# Patient Record
Sex: Male | Born: 1940 | Race: White | Hispanic: No | Marital: Married | Smoking: Former smoker
Health system: Southern US, Community
[De-identification: ages and names within clinical notes are randomized; demographics above are authoritative.]

## PROBLEM LIST (undated history)

## (undated) DIAGNOSIS — G8929 Other chronic pain: Secondary | ICD-10-CM

## (undated) DIAGNOSIS — K449 Diaphragmatic hernia without obstruction or gangrene: Secondary | ICD-10-CM

## (undated) DIAGNOSIS — R339 Retention of urine, unspecified: Secondary | ICD-10-CM

## (undated) DIAGNOSIS — G629 Polyneuropathy, unspecified: Secondary | ICD-10-CM

## (undated) DIAGNOSIS — K219 Gastro-esophageal reflux disease without esophagitis: Secondary | ICD-10-CM

## (undated) DIAGNOSIS — Z951 Presence of aortocoronary bypass graft: Secondary | ICD-10-CM

## (undated) DIAGNOSIS — R011 Cardiac murmur, unspecified: Secondary | ICD-10-CM

## (undated) DIAGNOSIS — IMO0001 Reserved for inherently not codable concepts without codable children: Secondary | ICD-10-CM

## (undated) DIAGNOSIS — I209 Angina pectoris, unspecified: Secondary | ICD-10-CM

## (undated) DIAGNOSIS — Z9889 Other specified postprocedural states: Secondary | ICD-10-CM

## (undated) DIAGNOSIS — I219 Acute myocardial infarction, unspecified: Secondary | ICD-10-CM

## (undated) DIAGNOSIS — I10 Essential (primary) hypertension: Secondary | ICD-10-CM

## (undated) DIAGNOSIS — H9190 Unspecified hearing loss, unspecified ear: Secondary | ICD-10-CM

## (undated) DIAGNOSIS — C61 Malignant neoplasm of prostate: Secondary | ICD-10-CM

## (undated) DIAGNOSIS — M109 Gout, unspecified: Secondary | ICD-10-CM

## (undated) DIAGNOSIS — D689 Coagulation defect, unspecified: Secondary | ICD-10-CM

## (undated) DIAGNOSIS — I2510 Atherosclerotic heart disease of native coronary artery without angina pectoris: Secondary | ICD-10-CM

## (undated) DIAGNOSIS — G4700 Insomnia, unspecified: Secondary | ICD-10-CM

## (undated) DIAGNOSIS — N200 Calculus of kidney: Secondary | ICD-10-CM

## (undated) DIAGNOSIS — M1990 Unspecified osteoarthritis, unspecified site: Secondary | ICD-10-CM

## (undated) DIAGNOSIS — E7800 Pure hypercholesterolemia, unspecified: Secondary | ICD-10-CM

## (undated) DIAGNOSIS — Z955 Presence of coronary angioplasty implant and graft: Secondary | ICD-10-CM

## (undated) DIAGNOSIS — Z789 Other specified health status: Secondary | ICD-10-CM

## (undated) DIAGNOSIS — N35919 Unspecified urethral stricture, male, unspecified site: Secondary | ICD-10-CM

## (undated) DIAGNOSIS — I82409 Acute embolism and thrombosis of unspecified deep veins of unspecified lower extremity: Secondary | ICD-10-CM

## (undated) DIAGNOSIS — N320 Bladder-neck obstruction: Secondary | ICD-10-CM

## (undated) DIAGNOSIS — K229 Disease of esophagus, unspecified: Secondary | ICD-10-CM

## (undated) DIAGNOSIS — M549 Dorsalgia, unspecified: Secondary | ICD-10-CM

## (undated) DIAGNOSIS — IMO0002 Reserved for concepts with insufficient information to code with codable children: Secondary | ICD-10-CM

## (undated) HISTORY — DX: Diaphragmatic hernia without obstruction or gangrene: K44.9

## (undated) HISTORY — PX: COLONOSCOPY: SHX174

## (undated) HISTORY — PX: CATARACT EXTRACTION W/ INTRAOCULAR LENS  IMPLANT, BILATERAL: SHX1307

## (undated) HISTORY — PX: CORONARY ANGIOPLASTY WITH STENT PLACEMENT: SHX49

## (undated) HISTORY — DX: Coagulation defect, unspecified: D68.9

## (undated) HISTORY — DX: Atherosclerotic heart disease of native coronary artery without angina pectoris: I25.10

## (undated) HISTORY — PX: OTHER SURGICAL HISTORY: SHX169

## (undated) HISTORY — DX: Calculus of kidney: N20.0

## (undated) HISTORY — PX: EXTRACORPOREAL SHOCK WAVE LITHOTRIPSY: SHX1557

## (undated) HISTORY — PX: POLYPECTOMY: SHX149

## (undated) HISTORY — DX: Essential (primary) hypertension: I10

## (undated) HISTORY — PX: CARDIAC CATHETERIZATION: SHX172

## (undated) HISTORY — DX: Disease of esophagus, unspecified: K22.9

---
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
## 1969-11-24 DIAGNOSIS — M109 Gout, unspecified: Secondary | ICD-10-CM

## 1969-11-24 HISTORY — DX: Gout, unspecified: M10.9

## 1979-07-26 HISTORY — PX: TYMPANOPLASTY: SHX33

## 1987-11-25 DIAGNOSIS — I209 Angina pectoris, unspecified: Secondary | ICD-10-CM

## 1987-11-25 HISTORY — DX: Angina pectoris, unspecified: I20.9

## 1987-11-25 HISTORY — PX: OTHER SURGICAL HISTORY: SHX169

## 1994-11-24 HISTORY — PX: PROSTATECTOMY: SHX69

## 1998-11-24 HISTORY — PX: CORONARY ARTERY BYPASS GRAFT: SHX141

## 1999-04-01 ENCOUNTER — Inpatient Hospital Stay (HOSPITAL_COMMUNITY): Admission: EM | Admit: 1999-04-01 | Discharge: 1999-04-03 | Payer: Self-pay | Admitting: Emergency Medicine

## 1999-04-01 ENCOUNTER — Encounter: Payer: Self-pay | Admitting: Emergency Medicine

## 1999-06-03 ENCOUNTER — Inpatient Hospital Stay (HOSPITAL_COMMUNITY): Admission: AD | Admit: 1999-06-03 | Discharge: 1999-06-11 | Payer: Self-pay | Admitting: Cardiology

## 1999-06-05 ENCOUNTER — Encounter: Payer: Self-pay | Admitting: Surgery

## 1999-06-06 ENCOUNTER — Encounter: Payer: Self-pay | Admitting: Surgery

## 1999-06-07 ENCOUNTER — Encounter: Payer: Self-pay | Admitting: Surgery

## 1999-06-09 ENCOUNTER — Encounter: Payer: Self-pay | Admitting: Surgery

## 2000-11-24 HISTORY — PX: CAROTID ENDARTERECTOMY: SUR193

## 2001-07-21 ENCOUNTER — Encounter: Payer: Self-pay | Admitting: *Deleted

## 2001-07-22 ENCOUNTER — Inpatient Hospital Stay (HOSPITAL_COMMUNITY): Admission: RE | Admit: 2001-07-22 | Discharge: 2001-07-23 | Payer: Self-pay | Admitting: *Deleted

## 2001-07-22 ENCOUNTER — Encounter (INDEPENDENT_AMBULATORY_CARE_PROVIDER_SITE_OTHER): Payer: Self-pay | Admitting: *Deleted

## 2001-11-24 HISTORY — PX: CYSTOSCOPY W/ INTERNAL URETHROTOMY: SUR376

## 2002-01-27 ENCOUNTER — Ambulatory Visit (HOSPITAL_COMMUNITY): Admission: RE | Admit: 2002-01-27 | Discharge: 2002-01-27 | Payer: Self-pay | Admitting: Urology

## 2002-05-23 ENCOUNTER — Ambulatory Visit (HOSPITAL_COMMUNITY): Admission: RE | Admit: 2002-05-23 | Discharge: 2002-05-23 | Payer: Self-pay | Admitting: Urology

## 2002-07-21 ENCOUNTER — Ambulatory Visit (HOSPITAL_COMMUNITY): Admission: RE | Admit: 2002-07-21 | Discharge: 2002-07-21 | Payer: Self-pay | Admitting: Urology

## 2002-08-11 ENCOUNTER — Inpatient Hospital Stay (HOSPITAL_COMMUNITY): Admission: AD | Admit: 2002-08-11 | Discharge: 2002-08-15 | Payer: Self-pay | Admitting: Cardiology

## 2002-12-08 ENCOUNTER — Ambulatory Visit (HOSPITAL_COMMUNITY): Admission: RE | Admit: 2002-12-08 | Discharge: 2002-12-08 | Payer: Self-pay | Admitting: *Deleted

## 2002-12-08 ENCOUNTER — Encounter: Payer: Self-pay | Admitting: *Deleted

## 2003-03-27 ENCOUNTER — Ambulatory Visit (HOSPITAL_COMMUNITY): Admission: RE | Admit: 2003-03-27 | Discharge: 2003-03-27 | Payer: Self-pay | Admitting: Urology

## 2003-12-06 ENCOUNTER — Ambulatory Visit (HOSPITAL_COMMUNITY): Admission: RE | Admit: 2003-12-06 | Discharge: 2003-12-06 | Payer: Self-pay | Admitting: Urology

## 2004-02-19 ENCOUNTER — Observation Stay (HOSPITAL_COMMUNITY): Admission: AD | Admit: 2004-02-19 | Discharge: 2004-02-20 | Payer: Self-pay | Admitting: Cardiology

## 2004-11-24 DIAGNOSIS — I219 Acute myocardial infarction, unspecified: Secondary | ICD-10-CM

## 2004-11-24 HISTORY — DX: Acute myocardial infarction, unspecified: I21.9

## 2005-03-30 ENCOUNTER — Ambulatory Visit: Payer: Self-pay | Admitting: Cardiology

## 2005-03-30 ENCOUNTER — Emergency Department (HOSPITAL_COMMUNITY): Admission: EM | Admit: 2005-03-30 | Discharge: 2005-03-30 | Payer: Self-pay | Admitting: Emergency Medicine

## 2005-04-02 ENCOUNTER — Ambulatory Visit: Payer: Self-pay | Admitting: Cardiology

## 2005-04-02 ENCOUNTER — Ambulatory Visit: Payer: Self-pay | Admitting: *Deleted

## 2005-04-02 ENCOUNTER — Inpatient Hospital Stay (HOSPITAL_COMMUNITY): Admission: EM | Admit: 2005-04-02 | Discharge: 2005-04-05 | Payer: Self-pay | Admitting: Emergency Medicine

## 2005-04-28 ENCOUNTER — Ambulatory Visit: Payer: Self-pay | Admitting: Cardiology

## 2005-04-28 ENCOUNTER — Encounter (HOSPITAL_COMMUNITY): Admission: RE | Admit: 2005-04-28 | Discharge: 2005-07-27 | Payer: Self-pay | Admitting: Cardiology

## 2005-05-15 ENCOUNTER — Ambulatory Visit: Payer: Self-pay | Admitting: Cardiology

## 2005-05-22 ENCOUNTER — Ambulatory Visit: Payer: Self-pay

## 2005-06-26 ENCOUNTER — Ambulatory Visit: Payer: Self-pay | Admitting: Cardiology

## 2005-07-22 ENCOUNTER — Ambulatory Visit: Payer: Self-pay | Admitting: Cardiology

## 2005-07-23 ENCOUNTER — Ambulatory Visit: Payer: Self-pay | Admitting: Cardiology

## 2005-07-23 ENCOUNTER — Ambulatory Visit (HOSPITAL_COMMUNITY): Admission: RE | Admit: 2005-07-23 | Discharge: 2005-07-24 | Payer: Self-pay | Admitting: Cardiology

## 2005-07-28 ENCOUNTER — Encounter (HOSPITAL_COMMUNITY): Admission: RE | Admit: 2005-07-28 | Discharge: 2005-10-26 | Payer: Self-pay | Admitting: Cardiology

## 2005-08-04 ENCOUNTER — Ambulatory Visit: Payer: Self-pay | Admitting: Physician Assistant

## 2005-09-01 ENCOUNTER — Ambulatory Visit: Payer: Self-pay | Admitting: Cardiology

## 2005-09-24 ENCOUNTER — Ambulatory Visit: Payer: Self-pay | Admitting: *Deleted

## 2005-10-01 ENCOUNTER — Ambulatory Visit: Payer: Self-pay | Admitting: Cardiology

## 2005-10-30 ENCOUNTER — Ambulatory Visit: Payer: Self-pay | Admitting: Cardiology

## 2005-12-01 ENCOUNTER — Ambulatory Visit (HOSPITAL_COMMUNITY): Admission: RE | Admit: 2005-12-01 | Discharge: 2005-12-01 | Payer: Self-pay | Admitting: Urology

## 2005-12-25 ENCOUNTER — Ambulatory Visit: Payer: Self-pay | Admitting: Cardiology

## 2006-01-28 ENCOUNTER — Ambulatory Visit: Payer: Self-pay | Admitting: Cardiology

## 2006-03-18 ENCOUNTER — Ambulatory Visit: Payer: Self-pay | Admitting: *Deleted

## 2006-08-24 ENCOUNTER — Ambulatory Visit (HOSPITAL_BASED_OUTPATIENT_CLINIC_OR_DEPARTMENT_OTHER): Admission: RE | Admit: 2006-08-24 | Discharge: 2006-08-24 | Payer: Self-pay | Admitting: Urology

## 2006-09-10 ENCOUNTER — Emergency Department (HOSPITAL_COMMUNITY): Admission: EM | Admit: 2006-09-10 | Discharge: 2006-09-10 | Payer: Self-pay | Admitting: Emergency Medicine

## 2006-11-05 ENCOUNTER — Ambulatory Visit: Payer: Self-pay | Admitting: Cardiology

## 2007-08-10 ENCOUNTER — Ambulatory Visit: Payer: Self-pay | Admitting: Cardiology

## 2007-10-26 ENCOUNTER — Ambulatory Visit: Payer: Self-pay | Admitting: Cardiology

## 2007-10-26 ENCOUNTER — Inpatient Hospital Stay (HOSPITAL_COMMUNITY): Admission: EM | Admit: 2007-10-26 | Discharge: 2007-10-28 | Payer: Self-pay | Admitting: Emergency Medicine

## 2007-11-01 ENCOUNTER — Ambulatory Visit: Payer: Self-pay | Admitting: Cardiology

## 2007-11-01 LAB — CONVERTED CEMR LAB
BUN: 12 mg/dL (ref 6–23)
Creatinine, Ser: 1.2 mg/dL (ref 0.4–1.5)
GFR calc Af Amer: 78 mL/min
GFR calc non Af Amer: 64 mL/min
Potassium: 3.8 meq/L (ref 3.5–5.1)

## 2007-12-22 ENCOUNTER — Ambulatory Visit: Payer: Self-pay | Admitting: Cardiology

## 2007-12-23 ENCOUNTER — Ambulatory Visit (HOSPITAL_BASED_OUTPATIENT_CLINIC_OR_DEPARTMENT_OTHER): Admission: RE | Admit: 2007-12-23 | Discharge: 2007-12-23 | Payer: Self-pay | Admitting: Urology

## 2008-02-25 ENCOUNTER — Ambulatory Visit (HOSPITAL_BASED_OUTPATIENT_CLINIC_OR_DEPARTMENT_OTHER): Admission: RE | Admit: 2008-02-25 | Discharge: 2008-02-25 | Payer: Self-pay | Admitting: Urology

## 2008-03-02 ENCOUNTER — Inpatient Hospital Stay (HOSPITAL_COMMUNITY): Admission: EM | Admit: 2008-03-02 | Discharge: 2008-03-04 | Payer: Self-pay | Admitting: Emergency Medicine

## 2008-03-02 ENCOUNTER — Ambulatory Visit: Payer: Self-pay | Admitting: Cardiology

## 2008-03-15 ENCOUNTER — Ambulatory Visit: Payer: Self-pay | Admitting: Cardiology

## 2008-12-21 ENCOUNTER — Ambulatory Visit (HOSPITAL_BASED_OUTPATIENT_CLINIC_OR_DEPARTMENT_OTHER): Admission: RE | Admit: 2008-12-21 | Discharge: 2008-12-21 | Payer: Self-pay | Admitting: Urology

## 2008-12-25 ENCOUNTER — Ambulatory Visit: Payer: Self-pay | Admitting: Cardiology

## 2009-02-14 ENCOUNTER — Encounter (INDEPENDENT_AMBULATORY_CARE_PROVIDER_SITE_OTHER): Payer: Self-pay | Admitting: *Deleted

## 2009-08-18 DIAGNOSIS — I2510 Atherosclerotic heart disease of native coronary artery without angina pectoris: Secondary | ICD-10-CM | POA: Insufficient documentation

## 2009-08-18 DIAGNOSIS — N200 Calculus of kidney: Secondary | ICD-10-CM

## 2009-08-18 DIAGNOSIS — I10 Essential (primary) hypertension: Secondary | ICD-10-CM | POA: Insufficient documentation

## 2009-08-18 DIAGNOSIS — Z8546 Personal history of malignant neoplasm of prostate: Secondary | ICD-10-CM | POA: Insufficient documentation

## 2009-08-23 ENCOUNTER — Ambulatory Visit: Payer: Self-pay | Admitting: Cardiology

## 2009-08-23 DIAGNOSIS — E7800 Pure hypercholesterolemia, unspecified: Secondary | ICD-10-CM | POA: Insufficient documentation

## 2009-12-14 ENCOUNTER — Telehealth (INDEPENDENT_AMBULATORY_CARE_PROVIDER_SITE_OTHER): Payer: Self-pay | Admitting: *Deleted

## 2009-12-19 ENCOUNTER — Ambulatory Visit (HOSPITAL_COMMUNITY)
Admission: RE | Admit: 2009-12-19 | Discharge: 2009-12-19 | Payer: Self-pay | Admitting: Physical Medicine and Rehabilitation

## 2010-01-22 ENCOUNTER — Telehealth: Payer: Self-pay | Admitting: Gastroenterology

## 2010-03-19 ENCOUNTER — Ambulatory Visit (HOSPITAL_BASED_OUTPATIENT_CLINIC_OR_DEPARTMENT_OTHER): Admission: RE | Admit: 2010-03-19 | Discharge: 2010-03-19 | Payer: Self-pay | Admitting: Urology

## 2010-04-18 ENCOUNTER — Ambulatory Visit: Payer: Self-pay | Admitting: Cardiology

## 2010-04-19 ENCOUNTER — Ambulatory Visit: Payer: Self-pay | Admitting: Cardiology

## 2010-04-25 LAB — CONVERTED CEMR LAB
Cholesterol: 285 mg/dL — ABNORMAL HIGH (ref 0–200)
GFR calc non Af Amer: 71.33 mL/min (ref >60)
HDL: 51 mg/dL (ref >39.00)
Potassium: 4.3 meq/L (ref 3.5–5.1)
Sodium: 140 meq/L (ref 135–145)
Triglycerides: 131 mg/dL (ref 0.0–149.0)
VLDL: 26.2 mg/dL (ref 0.0–40.0)

## 2010-11-08 ENCOUNTER — Ambulatory Visit: Payer: Self-pay | Admitting: Cardiology

## 2010-11-08 ENCOUNTER — Encounter: Payer: Self-pay | Admitting: Cardiology

## 2010-11-08 DIAGNOSIS — I25810 Atherosclerosis of coronary artery bypass graft(s) without angina pectoris: Secondary | ICD-10-CM

## 2010-12-17 ENCOUNTER — Inpatient Hospital Stay (HOSPITAL_COMMUNITY)
Admission: EM | Admit: 2010-12-17 | Discharge: 2010-12-19 | Payer: Self-pay | Source: Home / Self Care | Attending: Cardiology | Admitting: Cardiology

## 2010-12-17 ENCOUNTER — Ambulatory Visit: Admission: RE | Admit: 2010-12-17 | Discharge: 2010-12-17 | Payer: Self-pay | Source: Home / Self Care

## 2010-12-17 ENCOUNTER — Encounter: Payer: Self-pay | Admitting: Internal Medicine

## 2010-12-18 LAB — COMPREHENSIVE METABOLIC PANEL
ALT: 32 U/L (ref 0–53)
ALT: 27 U/L (ref 0–53)
AST: 23 U/L (ref 0–37)
AST: 22 U/L (ref 0–37)
Albumin: 3.6 g/dL (ref 3.5–5.2)
Albumin: 3.5 g/dL (ref 3.5–5.2)
Alkaline Phosphatase: 43 U/L (ref 39–117)
Alkaline Phosphatase: 41 U/L (ref 39–117)
CO2: 26 mEq/L (ref 19–32)
Calcium: 10 mg/dL (ref 8.4–10.5)
Chloride: 100 mEq/L (ref 96–112)
GFR calc Af Amer: 43 mL/min — ABNORMAL LOW (ref >60)
GFR calc Af Amer: 53 mL/min — ABNORMAL LOW (ref >60)
GFR calc non Af Amer: 44 mL/min — ABNORMAL LOW (ref >60)
Glucose, Bld: 99 mg/dL (ref 70–99)
Potassium: 3.9 mEq/L (ref 3.5–5.1)
Potassium: 4.4 mEq/L (ref 3.5–5.1)
Sodium: 138 mEq/L (ref 135–145)
Total Bilirubin: 0.5 mg/dL (ref 0.3–1.2)
Total Protein: 6.6 g/dL (ref 6.0–8.3)

## 2010-12-18 LAB — URINALYSIS, ROUTINE W REFLEX MICROSCOPIC
Ketones, ur: NEGATIVE mg/dL
Nitrite: NEGATIVE
Protein, ur: 100 mg/dL — AB
Urine Glucose, Fasting: NEGATIVE mg/dL
pH: 6 (ref 5.0–8.0)

## 2010-12-18 LAB — BASIC METABOLIC PANEL
BUN: 22 mg/dL (ref 6–23)
CO2: 25 mEq/L (ref 19–32)
Chloride: 102 mEq/L (ref 96–112)
Glucose, Bld: 120 mg/dL — ABNORMAL HIGH (ref 70–99)
Potassium: 4 mEq/L (ref 3.5–5.1)

## 2010-12-18 LAB — POCT CARDIAC MARKERS
CKMB, poc: 2.3 ng/mL (ref 1.0–8.0)
Myoglobin, poc: 261 ng/mL (ref 12–200)
Troponin i, poc: <0.05 ng/mL (ref 0.00–0.09)

## 2010-12-18 LAB — URINE MICROSCOPIC-ADD ON

## 2010-12-18 LAB — TROPONIN I: Troponin I: 0.03 ng/mL (ref 0.00–0.06)

## 2010-12-18 LAB — CBC
HCT: 38.3 % — ABNORMAL LOW (ref 39.0–52.0)
Hemoglobin: 12.3 g/dL — ABNORMAL LOW (ref 13.0–17.0)
Hemoglobin: 11.6 g/dL — ABNORMAL LOW (ref 13.0–17.0)
MCH: 22.9 pg — ABNORMAL LOW (ref 26.0–34.0)
MCH: 22.7 pg — ABNORMAL LOW (ref 26.0–34.0)
MCHC: 30.3 g/dL (ref 30.0–36.0)
Platelets: 396 10*3/uL (ref 150–400)
RBC: 5.37 MIL/uL (ref 4.22–5.81)
RDW: 18.6 % — ABNORMAL HIGH (ref 11.5–15.5)

## 2010-12-18 LAB — PROTIME-INR
INR: 0.89 (ref 0.00–1.49)
Prothrombin Time: 12.3 seconds (ref 11.6–15.2)

## 2010-12-18 LAB — CARDIAC PANEL(CRET KIN+CKTOT+MB+TROPI)
Relative Index: 1.7 (ref 0.0–2.5)
Relative Index: 1.6 (ref 0.0–2.5)
Troponin I: 0.02 ng/mL (ref 0.00–0.06)

## 2010-12-18 LAB — HEPARIN LEVEL (UNFRACTIONATED): Heparin Unfractionated: 0.2 IU/mL — ABNORMAL LOW (ref 0.30–0.70)

## 2010-12-19 ENCOUNTER — Ambulatory Visit: Admission: RE | Admit: 2010-12-19 | Payer: Self-pay | Source: Home / Self Care | Admitting: Urology

## 2010-12-19 LAB — POCT ACTIVATED CLOTTING TIME: Activated Clotting Time: 81 seconds

## 2010-12-19 LAB — BASIC METABOLIC PANEL
Chloride: 106 mEq/L (ref 96–112)
Creatinine, Ser: 1.34 mg/dL (ref 0.4–1.5)
GFR calc Af Amer: >60 mL/min (ref >60)

## 2010-12-19 LAB — CBC
MCH: 22.8 pg — ABNORMAL LOW (ref 26.0–34.0)
Platelets: 352 10*3/uL (ref 150–400)
RBC: 4.91 MIL/uL (ref 4.22–5.81)
WBC: 4.8 10*3/uL (ref 4.0–10.5)

## 2010-12-19 NOTE — H&P (Addendum)
NAME:  Caleb Berry, Caleb Berry NO.:  [id number]  MEDICAL RECORD NO.:  [id number]          PATIENT TYPE:  EMS  LOCATION:  [facility]JO                         FACILITY:  [facility]  PHYSICIAN:  [name], MD, FACCDATE OF BIRTH:  [birth year]  DATE OF ADMISSION:  12/17/2010 DATE OF DISCHARGE:                             HISTORY & PHYSICAL   PRIMARY CARE PHYSICIAN:  Stacie Acres. Cliffton Asters, MD.  CARDIOLOGIST:  Arturo Morton. Riley Kill, MD, [facility]  REASON FOR PRESENTATION:  Evaluate patient with chest pain and dizziness.  HISTORY OF PRESENT ILLNESS:  The patient is a pleasant 70 year old gentleman with extensive cardiac history including multiple percutaneous interventions and bypass.  He does get angina periodically.  He never takes nitroglycerin.  He just lets it go away.  He describes this as substernal with a left arm discomfort.  However, today when he was out golfing, he did get his usual arm discomfort.  It lasted for several minutes and went away on its own.  However, following this he had some dizziness which was distinctly unusual.  He felt lightheaded.  He felt unsteady.  He did not lose vision or speech.  He did not have presyncope or syncope.  Did not feel palpitations.  He presented to our office and was subsequently convinced to come to the emergency room.  He did, and point-of-care markers thus far have been negative x1 and his EKG was normal.  However, after getting up to go to the bathroom he had some upper and substernal chest discomfort that was more intense.  It radiated to his jaw.  This was somewhat unusual.  It did go away on its own.  He is currently painfree.  He does do activities that primarily centers around golfing.  He is not able to bring on his symptoms with this.  He has had no new shortness of breath, PND, or orthopnea..  He has had no cough, fevers, or chills.  He has had no stress testing or catheterization since 2008 by his report.  PAST MEDICAL  HISTORY: 1. Coronary artery disease (catheterization 2008 LAD stents with     severe stenosis, circumflex multiple stents with distal 40-50%     stenosis in a stent, right coronary artery had diffuse 80-90%     stenosis.  LIMA to the LAD was patent.  Saphenous vein graft to     ramus intermediate was patent, saphenous vein graft to diagonal was     patent.  Saphenous vein graft to PDA and posterolateral was patent.     The EF was normal). 2. Ankylosing spondylitis. 3. Prostate cancer. 4. Hypertension. 5. Nephrolithiasis. 6. Gout. 7. Peripheral neuropathy.  PAST SURGICAL HISTORY:  Multiple cystoscopies, urethroscopies, and urologic procedures, carotid endarterectomy.  ALLERGIES/INTOLERANCES:  ALEVE, CODEINE, and has been intolerant of all STATINS.  MEDICATIONS: 1. Allopurinol mg b.i.d. 2. Metoprolol 50 mg b.i.d. 3. Prilosec 20 mg daily. 4. Zantac 150 mg at bedtime. 5. Potassium 10 mg daily. 6. Hydrochlorothiazide 12.5 mg daily. 7. Lisinopril 2.5 mg daily. 8. Gabapentin 100 mg t.i.d. 9. Nasonex 50 mcg b.i.d.Marland Kitchen 10.Vicodin 1-2 p.o. q.8 h. p.r.n.  SOCIAL HISTORY:  The patient is married.  He is a Optician, dispensing.  He is not smoking cigarettes.  FAMILY HISTORY:  Contributory for multiple family members with early heart disease.  REVIEW OF SYSTEMS:  As stated in the HPI.  Positive for diffuse joint pains.  The patient also has urinary retention, self-catheterizes.  He is actually due to have a urologic procedure sounding like a TURP later this week and has actually had his aspirin stopped.  PHYSICAL EXAMINATION:  GENERAL:  The patient is pleasant and in no distress. VITAL SIGNS:  Blood pressure 122/84, heart rate 81 and regular, respiratory rate 18, afebrile. HEENT: Eyelids unremarkable.  Pupils equal, round, react to light. Fundi not visualized, oral mucosa unremarkable. NECK: No jugular distention at 45 degrees, carotid upstroke brisk and symmetric, well-healed carotid  endarterectomy scar, no thyromegaly. LYMPHATICS:  No cervical, axillary, inguinal adenopathy. LUNGS:  Clear to auscultation bilaterally. BACK:  No costovertebral angle tenderness. CHEST:  Well-healed sternotomy scar. HEART:  PMI not displaced or sustained, S1 and S2 within normal.  No S3, no S4, no clicks, no rubs, no murmurs. ABDOMEN:  Flat, positive bowel sounds, normal in frequency and pitch. No bruits, no rebound, no guarding, no midline pulsatile mass, no hepatomegaly, no splenomegaly. SKIN:  No rashes, no nodules. EXTREMITIES:  Pulses 2+ throughout.  No edema, no cyanosis, no clubbing. NEURO:  Oriented to person, place, and time, cranial nerves II-XII grossly intact, motor grossly intact.  EKG normal sinus rhythm, rate 78, axis within normal limits, intervals within normal limits, no acute ST-wave changes.  Sodium 138, potassium 3.9, BUN 22, creatinine 1.88, point-of-care markers troponin, CK negative x1, myoglobin mildly elevated.  ASSESSMENT AND PLAN: 1. Chest discomfort.  The patient's chest discomfort is worrisome.  He     has an extensive cardiac history.  He has no objective evidence of     ischemia at this point.  I will admit him and restart aspirin. No urologic procedure was planned.  He tells me this is something that could be delayed if necessary.  I will start heparin.  He should get nitroglycerin if he has any recurrent pain.  I will keep him n.p.o.  I would have a very low threshold for cardiac catheterization.  I discussed this with the patient.  He would like to discuss this with Dr. Riley Kill. 1. Renal insufficiency.  The patient's creatinine is elevated but only     on the initial i-STAT.  This has not been the case as far as I can     tell previously but this will be repeated in the morning. 2. Joint pains.  He will be given his usual Vicodin. 3. Prostate cancer.  Again, the procedure needs to be deferred until     we can sort out his cardiac issues. 4.  Hypertension.  Otherwise continue the meds as listed.     [name], MD, [facility]     JH/MEDQ  D:  12/17/2010  T:  12/17/2010  Job:  [id number]  Electronically Signed by [name] MD [facility] on 12/19/2010 12:41:03 PM

## 2010-12-20 ENCOUNTER — Telehealth: Payer: Self-pay | Admitting: Cardiology

## 2010-12-24 NOTE — Assessment & Plan Note (Signed)
Summary: Beto.Dent   Visit Type:  6 months follow up  CC:  No complains.  History of Present Illness: Patient is playing alot of golf.  He plays twice per week without difficulty.  No chest pain.  Had surgery in April with urethral surgery, and all went well.  Not having to self cath at this time.  Doing well overall.  Came off ASA two weeks before surgery and is doing ok.  Rides in cart but still walks alot.  Legs give out on him.  Feet burn alot.    Current Medications (verified): 1)  Allopurinol 100 Mg Tabs (Allopurinol) .... Take 1 Tablet By Mouth Once A Day 2)  Lopressor 50 Mg Tabs (Metoprolol Tartrate) .... Take 1 Tablet By Mouth Two Times A Day 3)  Prilosec Otc 20 Mg Tbec (Omeprazole Magnesium) .... Take 1 Tablet By Mouth Once A Day 4)  Ranitidine Hcl 150 Mg Caps (Ranitidine Hcl) .... Take 1 Tab By Mouth At Bedtime 5)  Potassium Chloride Crys Cr 20 Meq Cr-Tabs (Potassium Chloride Crys Cr) .... Take  1/2 Tablet Three Times A Day 6)  Aspirin Ec 325 Mg Tbec (Aspirin) .... Take 1/2  Tablet By Mouth Dailon Hold 7)  Hydrochlorothiazide 25 Mg Tabs (Hydrochlorothiazide) .... Take 1/2  Tablet By Mouth Two Times A Day 8)  Lisinopril 2.5 Mg Tabs (Lisinopril) .... Take One Tablet By Mouth Daily 9)  Lyrica 100 Mg Caps (Pregabalin) .... 3 X A Day 10)  Vicodin 5-500 Mg Tabs (Hydrocodone-Acetaminophen) .... As Needed  Allergies: 1)  ! * Aleve 2)  ! Codeine  Vital Signs:  Patient profile:   70 year old male Height:      68 inches Weight:      176 pounds BMI:     26.86 Pulse rate:   73 / minute Pulse rhythm:   regular Resp:     18 per minute BP sitting:   140 / 80  (right arm) Cuff size:   large  Vitals Entered By: Vikki Ports (Apr 18, 2010 4:32 PM)  Physical Exam  General:  Well developed, well nourished, in no acute distress. Head:  normocephalic and atraumatic Eyes:  PERRLA/EOM intact; conjunctiva and lids normal. Neck:  No carotid bruit. Lungs:  Clear bilaterally to auscultation  and percussion. Heart:  PMI non displaced. Normal S1 and S2.  No murmur or rub.   Abdomen:  Bowel sounds positive; abdomen soft and non-tender without masses, organomegaly, or hernias noted. No hepatosplenomegaly. Pulses:  pulses normal in all 4 extremities Extremities:  No clubbing or cyanosis. Neurologic:  Alert and oriented x 3.   EKG  Procedure date:  04/18/2010  Findings:      NSR.  Inferior MI, old.  Impression & Recommendations:  Problem # 1:  CAD (ICD-414.00) Continues to remain stable on a medical regimen.  Not a statin candidate, or other agents, all of which have been tried in the past.  Plays golf twice weekly without trouble.  Continue medical regimen as outlined, with beta blockage, ACE inhibition to reduce events, and ASA. His updated medication list for this problem includes:    Lopressor 50 Mg Tabs (Metoprolol tartrate) .Marland Kitchen... Take 1 tablet by mouth two times a day    Aspirin 81 Mg Tbec (Aspirin) .Marland Kitchen... Take one tablet by mouth daily    Lisinopril 2.5 Mg Tabs (Lisinopril) .Marland Kitchen... Take one tablet by mouth daily  Orders: EKG w/ Interpretation (93000)  Problem # 2:  HYPERCHOLESTEROLEMIA  IIA (ICD-272.0) See  above.  Extensive interventions in the past to treat, all with side effects.  Familial pattern  (have seen his mother, and son).    Problem # 3:  HYPERTENSION (ICD-401.9) reasonably well controlled.   His updated medication list for this problem includes:    Lopressor 50 Mg Tabs (Metoprolol tartrate) .Marland Kitchen... Take 1 tablet by mouth two times a day    Aspirin 81 Mg Tbec (Aspirin) .Marland Kitchen... Take one tablet by mouth daily    Hydrochlorothiazide 25 Mg Tabs (Hydrochlorothiazide) .Marland Kitchen... Take 1/2  tablet by mouth two times a day    Lisinopril 2.5 Mg Tabs (Lisinopril) .Marland Kitchen... Take one tablet by mouth daily  Orders: EKG w/ Interpretation (93000)  Patient Instructions: 1)  Your physician recommends that you return for a FASTING LIPID, BMP, MAGNESIUM and HgbA1C  (lab opens at 8:30)    2)  Your physician wants you to follow-up in: 6 MONTHS.   You will receive a reminder letter in the mail two months in advance. If you don't receive a letter, please call our office to schedule the follow-up appointment. 3)  Your physician has recommended you make the following change in your medication: DECREASE Aspirin to 81mg  once a day

## 2010-12-24 NOTE — Progress Notes (Signed)
Summary: refill  Phone Note Refill Request Call back at [phone]   Refills Requested: Medication #1:  LISINOPRIL 2.5 MG TABS Take one tablet by mouth daily   Supply Requested: 1 year  Medication #2:  LOPRESSOR 50 MG TABS Take 1 tablet by mouth two times a day   Supply Requested: 1 year  Medication #3:  POTASSIUM CHLORIDE CRYS CR 20 MEQ CR-TABS Take  1/2 tablet three times a day   Supply Requested: 1 year 90 day supply with 3 refills, please call when ready   Method Requested: Pick up at Office Initial call taken by: Migdalia Dk,  December 14, 2009 2:30 PM  Follow-up for Phone Call        Rx called to pharmacy. Pt. notified Follow-up by: Vikki Ports,  December 14, 2009 2:51 PM    Prescriptions: LISINOPRIL 2.5 MG TABS (LISINOPRIL) Take one tablet by mouth daily  #90 x 3   Entered by:   Vikki Ports   Authorized by:   Ronaldo Miyamoto, MD, [facility]   Signed by:   Vikki Ports on 12/14/2009   Method used:   Faxed to ...       [facility]order[facility]             , [place]         Ph:        Fax: [phone]   RxID:   [id number] POTASSIUM CHLORIDE CRYS CR 20 MEQ CR-TABS (POTASSIUM CHLORIDE CRYS CR) Take  1/2 tablet three times a day  #135 x 3   Entered by:   Vikki Ports   Authorized by:   Ronaldo Miyamoto, MD, [facility]   Signed by:   Vikki Ports on 12/14/2009   Method used:   Faxed to ...       [facility]mail[facility]order)             , [place]         Ph:        Fax: [phone]   RxID:   [id number] LOPRESSOR 50 MG TABS (METOPROLOL TARTRATE) Take 1 tablet by mouth two times a day  #180 x 3   Entered by:   Vikki Ports   Authorized by:   Ronaldo Miyamoto, MD, [facility]   Signed by:   Vikki Ports on 12/14/2009   Method used:   Faxed to ...       [facility] (mail[facility]order)             , [place]         Ph:        Fax: [phone]   RxID:   [id number]

## 2010-12-24 NOTE — Progress Notes (Signed)
Summary: Schedule recall colon  Phone Note Outgoing Call Call back at [facility] Phone [phone]   Call placed by: Christie Nottingham CMA Duncan Dull),  January 22, 2010 2:27 PM Call placed to: Patient Summary of Call: Left a message to call back and schedule recall colonoscopy.  Initial call taken by: Christie Nottingham CMA Duncan Dull),  January 22, 2010 2:27 PM  Follow-up for Phone Call        Left a message for pt to call back and schedule recall colonoscopy.  Follow-up by: Christie Nottingham CMA Duncan Dull),  January 29, 2010 10:07 AM

## 2010-12-25 ENCOUNTER — Telehealth (INDEPENDENT_AMBULATORY_CARE_PROVIDER_SITE_OTHER): Payer: Self-pay | Admitting: *Deleted

## 2010-12-26 ENCOUNTER — Ambulatory Visit (HOSPITAL_BASED_OUTPATIENT_CLINIC_OR_DEPARTMENT_OTHER)
Admission: RE | Admit: 2010-12-26 | Discharge: 2010-12-26 | Disposition: A | Discharge status: Home / Self Care | Payer: MEDICARE | Attending: Urology | Admitting: Urology

## 2010-12-26 DIAGNOSIS — N320 Bladder-neck obstruction: Secondary | ICD-10-CM | POA: Insufficient documentation

## 2010-12-26 DIAGNOSIS — IMO0002 Reserved for concepts with insufficient information to code with codable children: Secondary | ICD-10-CM | POA: Insufficient documentation

## 2010-12-26 HISTORY — PX: OTHER SURGICAL HISTORY: SHX169

## 2010-12-26 NOTE — Assessment & Plan Note (Signed)
Summary: f79m   Visit Type:  6 months follow up Primary Provider:  Laurann Montana  CC:  No cardiac complaints.  History of Present Illness: Had alot of nasal congestion.  No chest pain.  Saw Dr. Nira Retort, and got placed on a course of steroids, and inhalers.  Much better.  Was using Afrin before.   Now off.  Had elevated wbc, now improved.   Feels good.  No definite chest pain.   Problems Prior to Update: 1)  Cad, Artery Bypass Graft  (ICD-414.04) 2)  Hypercholesterolemia Iia  (ICD-272.0) 3)  Cad  (ICD-414.00) 4)  Hypertension  (ICD-401.9) 5)  Prostate Cancer, Hx of  (ICD-V10.46) 6)  Nephrolithiasis  (ICD-592.0)  Current Medications (verified): 1)  Allopurinol 100 Mg Tabs (Allopurinol) .... Take 1 Tablet By Mouth Once A Day 2)  Lopressor 50 Mg Tabs (Metoprolol Tartrate) .... Take 1 Tablet By Mouth Two Times A Day 3)  Prilosec Otc 20 Mg Tbec (Omeprazole Magnesium) .... Take 1 Tablet By Mouth Once A Day 4)  Ranitidine Hcl 150 Mg Caps (Ranitidine Hcl) .... Take 1 Tab By Mouth At Bedtime 5)  Potassium Chloride Crys Cr 20 Meq Cr-Tabs (Potassium Chloride Crys Cr) .... Take  1/2 Tablet Two Times A Day 6)  Aspirin 81 Mg Tbec (Aspirin) .... Take One Tablet By Mouth Daily 7)  Hydrochlorothiazide 25 Mg Tabs (Hydrochlorothiazide) .... Take 1/2  Tablet By Mouth Two Times A Day 8)  Lisinopril 2.5 Mg Tabs (Lisinopril) .... Take One Tablet By Mouth Daily 9)  Vicodin 5-500 Mg Tabs (Hydrocodone-Acetaminophen) .... As Needed 10)  Gabapentin 100 Mg Caps (Gabapentin) .... Take 1 Capsule By Mouth Three Times A Day 11)  Zyrtec Hives Relief 10 Mg Tabs (Cetirizine Hcl) .... 2 A Day 12)  Medrol Dose ? Marland Kitchen... Taje As Directed 13)  Nasonex 50 Mcg/act Susp (Mometasone Furoate) .... Two Times A Day  Allergies: 1)  ! * Aleve 2)  ! Codeine  Vital Signs:  Patient profile:   70 year old male Height:      68 inches Weight:      176 pounds BMI:     26.86 Pulse rate:   75 / minute Pulse rhythm:   regular Resp:      18 per minute BP sitting:   160 / 100  (left arm) Cuff size:   large  Vitals Entered By: Vikki Ports (November 08, 2010 11:48 AM)  Physical Exam  General:  Well developed, well nourished, in no acute distress. Head:  normocephalic and atraumatic Eyes:  PERRLA/EOM intact; conjunctiva and lids normal. Lungs:  Clear bilaterally to auscultation and percussion. Heart:  PMI non displaced.  Normal S1 and S2.  No murmur or rub.  Abdomen:  Bowel sounds positive; abdomen soft and non-tender without masses, organomegaly, or hernias noted. No hepatosplenomegaly. Pulses:  pulses normal in all 4 extremities Extremities:  No clubbing or cyanosis. Neurologic:  Alert and oriented x 3.   EKG  Procedure date:  11/08/2010  Findings:      NSR. WNL.  Impression & Recommendations:  Problem # 1:  CAD, ARTERY BYPASS GRAFT (ICD-414.04) overall seems stable. Agree with dc of Afrin.   His updated medication list for this problem includes:    Lopressor 50 Mg Tabs (Metoprolol tartrate) .Marland Kitchen... Take 1 tablet by mouth two times a day    Aspirin 81 Mg Tbec (Aspirin) .Marland Kitchen... Take one tablet by mouth daily    Lisinopril 2.5 Mg Tabs (Lisinopril) .Marland Kitchen... Take one tablet  by mouth daily  Problem # 2:  HYPERCHOLESTEROLEMIA  IIA (ICD-272.0) Unable to tolerate statins, or any other lipid lowering drugs.  She trail.  Problem # 3:  HYPERTENSION (ICD-401.9)  rechecked by me today, and found to be 150/80.  He will monitor through course of steroids.   His updated medication list for this problem includes:    Lopressor 50 Mg Tabs (Metoprolol tartrate) .Marland Kitchen... Take 1 tablet by mouth two times a day    Aspirin 81 Mg Tbec (Aspirin) .Marland Kitchen... Take one tablet by mouth daily    Hydrochlorothiazide 25 Mg Tabs (Hydrochlorothiazide) .Marland Kitchen... Take 1/2  tablet by mouth two times a day    Lisinopril 2.5 Mg Tabs (Lisinopril) .Marland Kitchen... Take one tablet by mouth daily  Orders: EKG w/ Interpretation (93000)  His updated medication list for this  problem includes:    Lopressor 50 Mg Tabs (Metoprolol tartrate) .Marland Kitchen... Take 1 tablet by mouth two times a day    Aspirin 81 Mg Tbec (Aspirin) .Marland Kitchen... Take one tablet by mouth daily    Hydrochlorothiazide 25 Mg Tabs (Hydrochlorothiazide) .Marland Kitchen... Take 1/2  tablet by mouth two times a day    Lisinopril 2.5 Mg Tabs (Lisinopril) .Marland Kitchen... Take one tablet by mouth daily  Patient Instructions: 1)  Your physician recommends that you continue on your current medications as directed. Please refer to the Current Medication list given to you today. 2)  Your physician wants you to follow-up in: 6 MONTHS.  You will receive a reminder letter in the mail two months in advance. If you don't receive a letter, please call our office to schedule the follow-up appointment. 3)  Your physician recommends that you return for lab work in: 3 WEEKS (CBC and BMP 414.04, 401.9, 272.0)--You can have this drawn with your PCP, if they will not draw labs then contact our office and we will draw bloodwork.

## 2010-12-26 NOTE — Progress Notes (Addendum)
Summary: req surg clearence  Phone Note From Other Clinic   Caller: pam fax [phone] phone [phone] ext [phone] Summary of Call: needs surgical clearence for cysto and dialation of urethral stricture Initial call taken by: Glynda Jaeger,  December 20, 2010 10:46 AM  Follow-up for Phone Call        This pt just had a cardiac cath on 12/18/10.  Medical therapy recommended.  I left a message for Pam to call back with date of procedure and which MD would be performing procedure.  I will forward this message to Dr Riley Kill to review (he may have already discussed this with pt during cath). Julieta Gutting, RN, BSN  December 20, 2010 4:29 PM  Pam called back and the pt is scheduled for Cysto with ballon dilitation of urethral restriction on 12/26/10 with Dr Patsi Sears under general anesthesia.  They would like the pt to hold ASA three days prior to procedure.  The pt told Pam that Dr Riley Kill verbally told the pt he was okay for procedure.  They need documentation from Dr Riley Kill.  I will forward this information to him for review.  Follow-up by: Julieta Gutting, RN, BSN,  December 20, 2010 4:42 PM  Additional Follow-up for Phone Call Additional follow up Details #1::        The patient should be stable for surgery, and can hold Aspirin.   Additional Follow-up by: Ronaldo Miyamoto, MD, [facility],  December 21, 2010 12:37 AM

## 2010-12-26 NOTE — Assessment & Plan Note (Signed)
Summary: walk in   chest pain  Nurse Visit   Vital Signs:  Patient profile:   69 year old male Height:      68 inches Weight:      178 pounds BMI:     27.16 Pulse rate:   80 / minute BP sitting:   122 / 72  (right arm)  Impression & Recommendations:  Problem # 1:  CAD, ARTERY BYPASS GRAFT (ICD-414.04)  His updated medication list for this problem includes:    Lopressor 50 Mg Tabs (Metoprolol tartrate) .Marland Kitchen... Take 1 tablet by mouth two times a day    Aspirin 81 Mg Tbec (Aspirin) .Marland Kitchen... Take one tablet by mouth daily    Lisinopril 2.5 Mg Tabs (Lisinopril) .Marland Kitchen... Take one tablet by mouth daily Patient drove him self in the office with  C/O of chest tightness, dizziness, blurred vision when standing nauseas. Pt. states these symptoms started when he was   playing golf. First symptom was pain on his left arm. Symptms are better except for chest  tightness, dizziness and pain between his shoulder blades. Pt. denies sob. Ekg done B/P right arm 122/72 pulse 80 beats/min.left arm 101/68. Dr. Graciela Husbands aware. MD recommended for pt. to go to [facility] ER via EMS. Pt. refused. He stated will drive himself to [place]. AMA form signed per Pt. [place] notified of pt's arrival.   Allergies: 1)  ! * Aleve 2)  ! Codeine

## 2010-12-27 LAB — POCT I-STAT, CHEM 8
BUN: 22 mg/dL (ref 6–23)
Calcium, Ion: 1.25 mmol/L (ref 1.12–1.32)
Chloride: 100 mEq/L (ref 96–112)
Glucose, Bld: 118 mg/dL — ABNORMAL HIGH (ref 70–99)

## 2010-12-28 ENCOUNTER — Inpatient Hospital Stay (HOSPITAL_COMMUNITY)
Admission: EM | Admit: 2010-12-28 | Discharge: 2010-12-30 | DRG: 872 | Disposition: A | Discharge status: Home / Self Care | Payer: Medicare Other | Attending: Internal Medicine | Admitting: Internal Medicine

## 2010-12-28 DIAGNOSIS — Y846 Urinary catheterization as the cause of abnormal reaction of the patient, or of later complication, without mention of misadventure at the time of the procedure: Secondary | ICD-10-CM | POA: Diagnosis present

## 2010-12-28 DIAGNOSIS — Z87442 Personal history of urinary calculi: Secondary | ICD-10-CM

## 2010-12-28 DIAGNOSIS — Z7982 Long term (current) use of aspirin: Secondary | ICD-10-CM

## 2010-12-28 DIAGNOSIS — N35919 Unspecified urethral stricture, male, unspecified site: Secondary | ICD-10-CM | POA: Diagnosis present

## 2010-12-28 DIAGNOSIS — G4700 Insomnia, unspecified: Secondary | ICD-10-CM | POA: Diagnosis present

## 2010-12-28 DIAGNOSIS — K219 Gastro-esophageal reflux disease without esophagitis: Secondary | ICD-10-CM | POA: Diagnosis present

## 2010-12-28 DIAGNOSIS — E785 Hyperlipidemia, unspecified: Secondary | ICD-10-CM | POA: Diagnosis present

## 2010-12-28 DIAGNOSIS — G609 Hereditary and idiopathic neuropathy, unspecified: Secondary | ICD-10-CM | POA: Diagnosis present

## 2010-12-28 DIAGNOSIS — Z8546 Personal history of malignant neoplasm of prostate: Secondary | ICD-10-CM

## 2010-12-28 DIAGNOSIS — J309 Allergic rhinitis, unspecified: Secondary | ICD-10-CM | POA: Diagnosis present

## 2010-12-28 DIAGNOSIS — Z79899 Other long term (current) drug therapy: Secondary | ICD-10-CM

## 2010-12-28 DIAGNOSIS — A419 Sepsis, unspecified organism: Principal | ICD-10-CM | POA: Diagnosis present

## 2010-12-28 DIAGNOSIS — M109 Gout, unspecified: Secondary | ICD-10-CM | POA: Diagnosis present

## 2010-12-28 DIAGNOSIS — I2510 Atherosclerotic heart disease of native coronary artery without angina pectoris: Secondary | ICD-10-CM | POA: Diagnosis present

## 2010-12-28 DIAGNOSIS — N390 Urinary tract infection, site not specified: Secondary | ICD-10-CM | POA: Diagnosis present

## 2010-12-28 DIAGNOSIS — M1990 Unspecified osteoarthritis, unspecified site: Secondary | ICD-10-CM | POA: Diagnosis present

## 2010-12-28 DIAGNOSIS — I10 Essential (primary) hypertension: Secondary | ICD-10-CM | POA: Diagnosis present

## 2010-12-28 DIAGNOSIS — Z951 Presence of aortocoronary bypass graft: Secondary | ICD-10-CM

## 2010-12-28 LAB — URINALYSIS, ROUTINE W REFLEX MICROSCOPIC
Specific Gravity, Urine: 1.02 (ref 1.005–1.030)
Urine Glucose, Fasting: NEGATIVE mg/dL
Urobilinogen, UA: 1 mg/dL (ref 0.0–1.0)

## 2010-12-28 LAB — URINE MICROSCOPIC-ADD ON

## 2010-12-28 LAB — LACTIC ACID, PLASMA: Lactic Acid, Venous: 4.2 mmol/L — ABNORMAL HIGH (ref 0.5–2.2)

## 2010-12-29 LAB — DIFFERENTIAL
Basophils Absolute: 0 10*3/uL (ref 0.0–0.1)
Basophils Relative: 0 % (ref 0–1)
Basophils Relative: 0 % (ref 0–1)
Eosinophils Absolute: 0 10*3/uL (ref 0.0–0.7)
Eosinophils Absolute: 0 10*3/uL (ref 0.0–0.7)
Eosinophils Relative: 0 % (ref 0–5)
Eosinophils Relative: 0 % (ref 0–5)
Lymphs Abs: 0.6 10*3/uL — ABNORMAL LOW (ref 0.7–4.0)
Lymphs Abs: 1.1 10*3/uL (ref 0.7–4.0)
Monocytes Relative: 8 % (ref 3–12)
Neutrophils Relative %: 88 % — ABNORMAL HIGH (ref 43–77)
Neutrophils Relative %: 87 % — ABNORMAL HIGH (ref 43–77)

## 2010-12-29 LAB — CBC
MCH: 23.1 pg — ABNORMAL LOW (ref 26.0–34.0)
MCHC: 30.6 g/dL (ref 30.0–36.0)
MCV: 75.4 fL — ABNORMAL LOW (ref 78.0–100.0)
MCV: 75.9 fL — ABNORMAL LOW (ref 78.0–100.0)
Platelets: 257 10*3/uL (ref 150–400)
Platelets: 282 10*3/uL (ref 150–400)
RBC: 4.59 MIL/uL (ref 4.22–5.81)
RBC: 4.61 MIL/uL (ref 4.22–5.81)
RDW: 17.9 % — ABNORMAL HIGH (ref 11.5–15.5)
RDW: 18.3 % — ABNORMAL HIGH (ref 11.5–15.5)
WBC: 17 10*3/uL — ABNORMAL HIGH (ref 4.0–10.5)

## 2010-12-29 LAB — COMPREHENSIVE METABOLIC PANEL
AST: 21 U/L (ref 0–37)
Albumin: 2.9 g/dL — ABNORMAL LOW (ref 3.5–5.2)
Alkaline Phosphatase: 34 U/L — ABNORMAL LOW (ref 39–117)
BUN: 14 mg/dL (ref 6–23)
Chloride: 102 mEq/L (ref 96–112)
GFR calc Af Amer: 52 mL/min — ABNORMAL LOW (ref >60)
Potassium: 4.3 mEq/L (ref 3.5–5.1)
Total Bilirubin: 0.6 mg/dL (ref 0.3–1.2)
Total Protein: 5.9 g/dL — ABNORMAL LOW (ref 6.0–8.3)

## 2010-12-29 LAB — POCT I-STAT, CHEM 8
BUN: 13 mg/dL (ref 6–23)
Calcium, Ion: 1.04 mmol/L — ABNORMAL LOW (ref 1.12–1.32)
Glucose, Bld: 130 mg/dL — ABNORMAL HIGH (ref 70–99)
TCO2: 23 mmol/L (ref 0–100)

## 2010-12-30 LAB — MAGNESIUM: Magnesium: 1.5 mg/dL (ref 1.5–2.5)

## 2010-12-30 LAB — COMPREHENSIVE METABOLIC PANEL
CO2: 27 mEq/L (ref 19–32)
Calcium: 8.5 mg/dL (ref 8.4–10.5)
Creatinine, Ser: 1.46 mg/dL (ref 0.4–1.5)
GFR calc non Af Amer: 48 mL/min — ABNORMAL LOW (ref >60)
Glucose, Bld: 121 mg/dL — ABNORMAL HIGH (ref 70–99)
Total Protein: 5.9 g/dL — ABNORMAL LOW (ref 6.0–8.3)

## 2010-12-30 LAB — CBC
HCT: 34.9 % — ABNORMAL LOW (ref 39.0–52.0)
Hemoglobin: 10.6 g/dL — ABNORMAL LOW (ref 13.0–17.0)
MCH: 23.1 pg — ABNORMAL LOW (ref 26.0–34.0)
MCHC: 30.4 g/dL (ref 30.0–36.0)
RDW: 18.3 % — ABNORMAL HIGH (ref 11.5–15.5)

## 2010-12-30 LAB — DIFFERENTIAL
Basophils Relative: 0 % (ref 0–1)
Eosinophils Relative: 1 % (ref 0–5)
Lymphocytes Relative: 9 % — ABNORMAL LOW (ref 12–46)
Monocytes Absolute: 1 10*3/uL (ref 0.1–1.0)
Monocytes Relative: 9 % (ref 3–12)
Neutro Abs: 9.2 10*3/uL — ABNORMAL HIGH (ref 1.7–7.7)

## 2011-01-01 NOTE — Progress Notes (Signed)
Summary: refill request  Phone Note Refill Request Message from:  Patient on December 25, 2010 10:03 AM  Refills Requested: Medication #1:  POTASSIUM CHLORIDE CRYS CR 20 MEQ CR-TABS Take  1/2 tablet two times a day  Medication #2:  LISINOPRIL 2.5 MG TABS Take one tablet by mouth daily metoprolol    Method Requested: Telephone to Pharmacy Initial call taken by: Glynda Jaeger,  December 25, 2010 10:04 AM

## 2011-01-04 LAB — CULTURE, BLOOD (ROUTINE X 2)
Culture  Setup Time: 201202050047
Culture  Setup Time: 201202050047
Culture: NO GROWTH

## 2011-01-07 NOTE — Discharge Summary (Signed)
NAME:  Caleb Berry, Caleb Berry                ACCOUNT NO.:  [id number]  MEDICAL RECORD NO.:  [id number]           PATIENT TYPE:  I  LOCATION:  1508                         FACILITY:  [facility]  PHYSICIAN:  Erick Blinks, MD     DATE OF BIRTH:  [birth year]  DATE OF ADMISSION:  12/28/2010 DATE OF DISCHARGE:  12/30/2010                              DISCHARGE SUMMARY   PRIMARY CARE PHYSICIAN:  Jillyn Hidden B. Truett Perna, M.D.  UROLOGISTLynelle Smoke I. Patsi Sears, M.D.  DISCHARGE DIAGNOSES: 1. Sepsis with urinary tract infection, improved. 2. Leukocytosis secondary to sepsis, improved. 3. History of coronary artery disease. 4. Hypertension. 5. Gastroesophageal reflux disease. 6. History of gout. 7. Arthritis. 8. History of insomnia. 9. History of allergic rhinitis. 10.History of prostate cancer. 11.Hyperlipidemia. 12.Chronic proximal urethral stricture, status post recent     cystourethroscopy with dilatation and chronic Foley placement.  DISCHARGE MEDICATIONS: 1. Magnesium oxide 200 mg by mouth twice daily for 3 days. 2. Omnicef 300 mg 1 tablet by mouth twice daily for 10 more days. 3. Enteric-coated aspirin 81 mg by mouth daily. 4. Gabapentin 100 mg in the morning, 100 mg at lunch and 100 mg at 4     p.m. and 200 mg at q.h.s. 5. Green miracle herbal supplement 2 tablets by mouth every morning. 6. Hydrochlorothiazide 25 mg half tablet by mouth twice daily. 7. Lisinopril 2.5 mg 1 tablet by mouth daily. 8. Lopressor 50 mg 1 tablet by mouth twice daily. 9. Nasonex 1 spray nasally twice daily as needed. 10.Potassium chloride 20 mEq 1/2 tablet by mouth twice daily. 11.Prilosec 20 mg 1 capsule by mouth daily. 12.Pyridium 100 mg 1 tablet by mouth every 6 hours as needed. 13.Ranitidine 150 mg 1 tablet by mouth daily at bedtime. 14.Vicodin 5/500 one to two tablets by mouth daily as needed. 15.Vitamin B complex with C one tablet by mouth daily. 16.Ambien 10 mg 1 tablet by mouth daily at bedtime as  needed. 17.Zyrtec 10 mg one tablet by mouth twice daily as needed.  ADMISSION HISTORY:  This is a of 70 year old gentleman, who has undergone recent urethral stricture dilatation, presented to the emergency room when he developed fever and chills.  He was already on ciprofloxacin after his urologic procedure, but he continued to have chills.  He was brought to the emergency room where his urine was shown to have 21-50 WBCs as well as 21-50 RBCs.  His lactic acid was found be elevated at 4.2 and he was subsequently admitted for treatment of sepsis with urinary tract infection.  For further details, please refer the history and physical dictated by Dr. Selena Batten on December 28, 2010.  HOSPITAL COURSE: 1. Urosepsis:  Patient was placed empirically on vancomycin and     Rocephin.  His blood cultures were found to be negative.  He was     seen in consultation by Dr. Patsi Sears from the urology service.     His Foley catheter will remain in place until he follows up with     Dr. Patsi Sears.  From an infectious standpoint, he is afebrile.     His WBC count was  originally elevated at 17,000, has currently     dropped down to 11,000.  He is currently feeling back to his     baseline and is requesting to go home.  We will complete a course     of antibiotics with oral antibiotics at home.  He already has a     followup appointment Dr. Patsi Sears and will follow up with Dr.     Cliffton Asters in 1-2 weeks.  The remainder of his medical issues have     remained stable.  The patient is otherwise ready for discharge     home.  CONSULTATIONS:  Urology, Sigmund I. Patsi Sears, M.D.  PROCEDURES:  None.  DIAGNOSTIC IMAGING:  None.  DISCHARGE INSTRUCTIONS:  DIET:  Patient should continue on a heart- healthy diet. ACTIVITIES:  Conduct his activity as tolerated. FOLLOWUP:  He will need to see Dr. Cliffton Asters in the next 1-2 weeks and he already has an appointment scheduled with Dr. Patsi Sears for further management of  his urethral stricture.  His Foley catheter will stay in place until he sees Dr. Patsi Sears in the office.  CONDITION AT THE TIME OF DISCHARGE:  Improved.  Time spent on this discharge is 45 minutes.     Erick Blinks, MD     JM/MEDQ  D:  12/30/2010  T:  12/30/2010  Job:  [id number]  cc:   Leighton Roach. Truett Perna, M.D. Fax: [phone]  Sigmund I. Patsi Sears, M.D. Fax: [phone]  Electronically Signed by Erick Blinks  on 01/07/2011 10:06:17 PM

## 2011-01-15 NOTE — Discharge Summary (Signed)
NAMELAVARR, PRESIDENT NO.:  [id number]  MEDICAL RECORD NO.:  [id number]          PATIENT TYPE:  INP  LOCATION:  2028                         FACILITY:  [facility]  PHYSICIAN:  Arturo Morton. Riley Kill, MD, FACCDATE OF BIRTH:  [birth year]  DATE OF ADMISSION:  12/17/2010 DATE OF DISCHARGE:  12/19/2010                              DISCHARGE SUMMARY   PRIMARY CARDIOLOGIST:  Maisie Fus D. Riley Kill, MD, [facility]  PRIMARY CARE PHYSICIAN:  Stacie Acres. White, MD  DISCHARGE DIAGNOSES: 1. Coronary artery disease, status post coronary artery bypass     grafting (left internal mammary artery to left anterior descending,     saphenous vein graft to ramus intermediate, saphenous vein graft to     diagonal, saphenous vein graft to posterior descending artery and     posterolateral).     a.     Status post cardiac catheterization on December 18, 2010,      demonstrating continued patency of all grafts.  There is severe      distal disease distal to the graft insertion. 2. Hypertension.  SECONDARY DIAGNOSES: 1. Prostate cancer. 2. Peripheral neuropathy. 3. Gout.  ALLERGIES AND INTOLERANCES: 1. ALEVE. 2. CODEINE. 3. STATINS.  PROCEDURE/DIAGNOSTICS PERFORMED DURING HOSPITALIZATION: 1. Left heart catheterization with selective coronary arteriography,     status vein angiography, and selective left internal mammary     angiography.     a.     Left main free of critical disease.  Continued patency of      the internal mammary to the LAD with diffuse distal disease.      Continued patency of a vein graft to diagonal.  Continued patency      of the vein graft to the obtuse marginal.  Continued patency of      the stent to the large obtuse marginal.  Continued patency of the      SVG to the PDA and PLA with distal disease.  REASON FOR ADMISSION:  This is a 70 year old gentleman with severe coronary artery disease, as stated above who presented to the [facility] Emergency Department with complaints of  chest pain worrisome for acute coronary syndrome.  The patient's EKG showed normal sinus rhythm without acute ST-T wave changes.  Point-of-care markers were initially negative. The patient was admitted for further evaluation.  HOSPITAL COURSE:  This patient continued to have recurrent chest discomfort.  Cardiac enzymes remained negative, but with recurrent symptoms and history of heart disease it was felt the catheterization would be needed to reassess coronary anatomy.  Risks, benefits, and indications were discussed with the patient and his wife and they agreed to proceed.  Dr. Riley Kill brought the patient to the Cath Lab where informed consent was obtained.  As above, there was continued patency of the patient's grafts as well as patency of stent to the large obtuse marginal.  It was noted that he had a small vessel disease distal to graft insertion.  It was felt that continued medical therapy would be warranted.  The patient had initially stopped his aspirin secondary to need for urological surgery.  The patient states this is not  an emergency and therefore the patient's aspirin has been continued.  The patient did tolerate the procedure well.  His groin site without evidence of hematoma.  The patient had no further complaints of chest pain.  It was felt that the patient's distal disease could be the source of his intermittent pain.  A D-dimer was obtained that was within normal limits.  The patient will be continued on aspirin, lisinopril, and Lopressor for his coronary artery disease, but no statin secondary to intolerance.  On the day of discharge, Dr. Riley Kill evaluated the patient and noted him stable for home with close outpatient followup.  DISCHARGE LABORATORY DATA:  WBC 4.8, hemoglobin 11.2, hematocrit 37.1 and platelets 352.  Sodium 138, potassium 4.5, chloride 106, bicarb 26, BUN 18, and creatinine 1.34.  DISCHARGE MEDICATIONS: 1. Allopurinol 100 mg daily. 2. Aspirin  81 mg daily. 3. Gabapentin 100 mg 1 capsule 3 times a day and then 2 capsules at     bedtime. 4. Hydrochlorothiazide 25 mg 1/2 tablet twice daily. 5. Lisinopril 2.5 mg daily. 6. Lopressor 50 mg 1 tablet twice daily. 7. Nasonex 50 mcg 1 spray nasally twice daily. 8. Potassium chloride 20 mEq 1/2 tablet twice daily. 9. Prilosec 20 mg 1 capsule daily. 10.Ranitidine 150 mg 1 tablet daily. 11.Vicodin 5/500 one to two tablets daily as needed for pain. 12.Zolpidem 10 mg 1 tablet daily at bedtime as needed for sleep. 13.Zyrtec 10 mg 1 tablet twice daily.  DISCHARGE PLANS AND INSTRUCTIONS: 1. The patient will follow up with Dr. Riley Kill on January 21, 2011,     at 4:30. 2. The patient is to increase activity slowly.  He may shower, but no     bathing.  No lifting for 1 week greater than 5 pounds.  No driving     for 2 days.  No sexual activity for 1 week.  He is to keep the cath     site clean and dry and call the office for any problems. 3. The patient is to continue a low-sodium, heart-healthy diet. 4. The patient is to follow up with Dr. Lindie Spruce as needed or previously     scheduled. 5. The patient is to avoid activities that causes chest pain or     shortness of breath.  DURATION OF DISCHARGE:  Greater than 30 minutes including physician and physician extender time.     Leonette Monarch, PA-C   ______________________________ Arturo Morton Riley Kill, MD, [facility]    NB/MEDQ  D:  12/19/2010  T:  12/20/2010  Job:  [id number]  cc:   Lynelle Smoke I. Patsi Sears, M.D. Stacie Acres Cliffton Asters, M.D.  Electronically Signed by Alen Blew P.A. on 12/23/2010 01:43:00 PM Electronically Signed by Shawnie Pons MD [facility] on 01/15/2011 09:07:38 PM

## 2011-01-15 NOTE — H&P (Signed)
NAME:  Caleb Berry, Caleb Berry NO.:  [id number]  MEDICAL RECORD NO.:  [id number]           PATIENT TYPE:  I  LOCATION:  1508                         [facility]ACILITY:  [facility]  PHYSICIAN:  Massie Maroon, MD        DATE OF BIRTH:  28-Sep-1941  DATE OF ADMISSION:  12/28/2010 DATE OF DISCHARGE:                             HISTORY & PHYSICAL   CHIEF COMPLAINT:  Fever and chills.  HISTORY OF PRESENT ILLNESS:  This 70 year old male apparently had a catheter placed and was preparing to undergo stricture dilation and vaporization and then apparently developed chest pain and so had a cardiac catheterization December 26, 2010, which was negative for any stenotic lesions that needed stenting or dilation.  The patient apparently then this past Thursday had cystoscopy with dilation and vaporization of stricture.  He then apparently developed fever and chills today, took the Cipro and then still continued to have chills at 3 p.m.  He took some ibuprofen which helped some but then continued to have fever and chills and so presented to the ED for evaluation.  His urinalysis showed wbcs 21 to 50, rbcs 21 to 50.  The patient's procalcitonin was normal but his lactic acid was slightly elevated at 4.2.  The patient will be admitted for possible urosepsis.  PAST MEDICAL HISTORY: 1. CAD status post CABG in 2000. 2. Hypertension. 3. Hyperlipidemia. 4. Gout. 5. GERD. 6. Prostate cancer. 7. Nephrolithiasis. 8. Arthritis. 9. Neuropathy.  PAST SURGICAL HISTORY: 1. 1989, percutaneous nephrostomy for nephrolithiasis. 2. July 22, 2001, left CEA. 3. January 27, 2002, urethral stricture dilation, urethrostomy, Urolumen     stricture prosthesis placement. 4. May 25, 2002, cystourethroscopy, catheterization, hyperplastic     polyp within the year old Urolumen prosthesis, status post     dilation. 5. July 21, 2002, urethral dilation. 6. August 15, 2002, cardiac catheterization. 7. Mar 27, 2003,  Bugbee electrode dissection, bladder neck contracture. 8. Apr 02, 2005, bare-metal stent to the obtuse marginal branch and a     drug-eluting stent in the mid left circ. 9. July 23, 2005, cardiac catheterization - distal left circ in-     stent restenosis. 10.December 01, 2005, cystourethroscopy with dilation. 11.November 01, 2007, catheterization indicating medical treatment. 12.December 23, 2007, cysto with transurethral dilation of stricture. 13.February 25, 2008, PK electrode vaporization and incision of urethral     stricture. 14.March 02, 2008, cystoscopy. 15.December 21, 2008, cysto with holmium laser vaporization of the     stricture and balloon dilation. 16.December 18, 2010, catheterization - negative.  SOCIAL HISTORY:  The patient does not smoke or drink.  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  FAMILY HISTORY:  Mother is alive at age 70 and has heart problems (CHF). Father died at age 18 of heart attack.  One brother age 1 is healthy and one sister age 68 is healthy.  ALLERGIES:  CODEINE causes headache, chest pain.  ALEVE causes chest pain.  MEDICATIONS: 1. Cipro. 2. Allopurinol 100 mg p.o. daily. 3. Enteric-coated aspirin 81 mg p.o. daily. 4. Gabapentin 100 mg  2 p.o. q.i.d. 5. Hydrochlorothiazide 12.5 mg p.o. b.i.d. 6. Lopressor 50 mg p.o. b.i.d. 7. Nasonex 1 spray intranasally daily. 8. Potassium chloride 20 mEq p.o. b.i.d. 9. Prilosec 20 mg p.o. daily. 10.Zantac 150 mg p.o. q.h.s. 11.Vicodin 5/500 mg one p.o. q.6 h p.r.n. 12.Ambien 10 mg p.o. q.h.s. 13.Zyrtec 10 mg p.o. b.i.d. p.r.n.  REVIEW OF SYSTEMS:  Negative for all 10 organ systems except for pertinent positives stated above.  PHYSICAL EXAM:  VITAL SIGNS:  Temperature 99.1, pulse 122, blood pressure 91/55, repeat pulse 98 and blood pressure 106/963, pulse ox is 97% on room air. HEENT:  Anicteric, EOMI, no nystagmus.  Pupils 1.5 mm, symmetric. Direct and consensual near  reflexes intact.  Mucous membranes moist. NECK:  No JVD, no bruit, no thyromegaly, no adenopathy. HEART:  Regular rate and rhythm.  S1, S2 with a 2 out of 6 systolic ejection murmur at the apex. LUNGS:  Clear to auscultation bilaterally. ABDOMEN:  Soft, nontender and nondistended.  Positive bowel sounds. EXTREMITIES:  No cyanosis, clubbing or edema. SKIN:  No rashes. LYMPH NODES:  No adenopathy. NEURO EXAM:  Nonfocal.  Cranial nerves II through XII intact.  Reflexes 2+, symmetric, diffuse with downgoing toes bilaterally, motor strength 5/5 in all 4 extremities.  Pinprick intact.  LABORATORY DATA:  Lactic acid 4.1.  Urinalysis, wbcs 21 to 50, rbcs 21 to 50, nitrate positive, leuk esterase large.  ASSESSMENT AND PLAN: 1. Urosepsis:  Ceftriaxone 1 g IV daily.  Vanco IV, pharmacy to dose.     Await urine culture.  Blood culture x2 sets pending.  CBC and CMP     results pending. 2. Coronary artery disease, status post coronary artery bypass     grafting, stent:  Continue aspirin.  Continue Lopressor.  Not sure     if he is on statin. 3. Hypertension:  Hold hydrochlorothiazide since blood pressure is     somewhat soft. 4. Gastroesophageal reflux disease:  Continue Prilosec.  Continue     Zantac. 5. Gout:  Continue allopurinol. 6. Arthritis:  Continue Vicodin as needed. 7. Insomnia:  Continue Ambien. 8. Allergic rhinitis:  Continue Zyrtec and Nasonex. 9. Deep vein thrombosis prophylaxis:  SCDs.     Massie Maroon, MD     JYK/MEDQ  D:  12/29/2010  T:  12/29/2010  Job:  [id number]  cc:   Stacie Acres. Cliffton Asters, M.D. Fax: [phone]  Sigmund I. Patsi Sears, M.D. Fax: [phone]  Arturo Morton. Riley Kill, MD, [facility] [address]  [address] [place] [address]  Dr. Angelica Chessman, M.D. [address] [place] [address] [place]  Electronically Signed by Pearson Grippe MD on 01/15/2011 01:57:55 PM

## 2011-01-15 NOTE — Procedures (Signed)
NAMEITAY, MELLA NO.:  [id number]  MEDICAL RECORD NO.:  [id number]          PATIENT TYPE:  INP  LOCATION:  2028                         FACILITY:  [facility]  PHYSICIAN:  Arturo Morton. Riley Kill, MD, FACCDATE OF BIRTH:  [birth year]  DATE OF PROCEDURE: DATE OF DISCHARGE:                           CARDIAC CATHETERIZATION   INDICATIONS:  Caleb Berry is very well known to me.  He is a 70 year old who presents with chest pain.  He underwent revascularization surgery in 2000 by Dr. Laneta Simmers.  He has had recurrent urologic issues recently and his aspirin has been stopped.  The current study was done to assess coronary anatomy.  PROCEDURES: 1. Left heart catheterization. 2. Selective coronary arteriography. 3. Saphenous vein graft angiography. 4. Selective left internal mammary angiography.  DESCRIPTION OF PROCEDURE:  The patient was pretreated with fluids.  His creatinine has been mildly elevated, but improved after intravenous fluids today.  We elected not to do ventriculography.  Through an anterior puncture, the femoral artery was entered and a 5-French sheath was placed.  Views of the left and right coronary arteries were obtained.  Vein graft angiography and internal mammary angiography were performed.  Pressures were measured.  No ventriculography was performed. All catheters were subsequently removed and the patient was taken to the holding area in satisfactory clinical condition for sheaths removal.  I then reviewed the films with the patient's wife.  There were no major complications.  HEMODYNAMIC DATA: 1. Central aortic pressure was 127/66, mean 88. 2. LV pressure 122/49. 3. No gradient or pullback across the aortic valve.  ANGIOGRAPHIC DATA: 1. The left main is free of critical disease. 2. The LAD is severely diseased proximally, enters into a stent that     has severe diffuse in-stent restenosis.  There are couple of septal     perforators, as well  as a diagonal noted distally coming off the     severely diseased LAD. 3. The native circumflex has a previously placed stent.  A large     marginal branch that is widely patent with less than 10% narrowing.     Distally, there is about 30-40% narrowing, then there is a stent     distally before bifurcation, which itself is widely patent with     less than 30% narrowing leading into a bifurcated distal marginal.     There are couple of side branches which come off the marginal, the     distal of which has 70-80% narrowing, but it is a very tiny branch. 4. The right coronary artery is severely diseased throughout with     multiple segmental plaques proximally with 80 and 70% narrowing.     There is competitive filling noted distally. 5. The vein graft to the obtuse marginal which is relatively small, is     widely patent, there is not a lot of change.  There may be mild     irregularity proximally, but no high-grade stenoses. 6. The vein graft to the diagonal branch goes to a small diagonal     branch and there is a very large graft in  size; however, despite     this, the vessel is widely patent. 7. The vein graft to the distal right circulation to the PDA and PLA     remains widely patent.  There is some narrowing at the distal-most     insertion, but the vessel at this point is only 1 to 1.5 mm. 8. The internal mammary of the distal left anterior descending artery     is widely patent into the LAD.  The LAD itself has diffusely     diseased and is a very small-caliber vessel distally.  CONCLUSIONS: 1. Continued patency of the internal mammary to the LAD with diffuse     distal disease. 2. Continued patency of the vein graft to the diagonal. 3. Continued patency of the vein graft to the obtuse marginal. 4. Continued patency of the stents to the large obtuse marginal. 5. Continued patency of the saphenous vein graft to the PDA and PLA     with distal disease.  DISPOSITION:  At the  present time, his grafts remain intact as to the previous stenting of his circumflex.  There is a lot of small-vessel disease especially distal to the graft insertions, and continued medical therapy will be warranted.     Arturo Morton. Riley Kill, MD, [facility]     TDS/MEDQ  D:  12/18/2010  T:  12/19/2010  Job:  [id number]  cc:   Lynelle Smoke I. Patsi Sears, M.D. [name], MD, [facility] Stacie Acres. Cliffton Asters, M.D. [facility]  Electronically Signed by Shawnie Pons MD [facility] on 01/15/2011 09:07:42 PM

## 2011-01-20 ENCOUNTER — Telehealth: Payer: Self-pay | Admitting: Cardiology

## 2011-01-20 NOTE — Op Note (Addendum)
  NAME:  Caleb Berry, Caleb Berry                ACCOUNT NO.:  [id number]  MEDICAL RECORD NO.:  [id number]          PATIENT TYPE:  AMB  LOCATION:  [facility]                         [facility]ACILITY:  [facility]  PHYSICIAN:  Vail Basista I. Patsi Sears, M.D.DATE OF BIRTH:  07-20-1941  DATE OF PROCEDURE: DATE OF DISCHARGE:                              OPERATIVE REPORT   PREOPERATIVE DIAGNOSIS:  Chronic proximal urethral stricture disease.  POSTOPERATIVE DIAGNOSIS:  Chronic proximal urethral stricture disease.  OPERATION:  Cystourethroscopy, Cook balloon dilation of dense proximal urethral stricture, transurethral vaporization of bladder neck contracture and proximal urethral stricture.  SURGEON:  Izabela Ow I. Patsi Sears, M.D.  ANESTHESIA:  General LMA.  PREPARATION:  After appropriate preanesthesia, the patient was brought to the operating room and placed upon the operating room table in the dorsal supine position where general LMA anesthesia was introduced.  The patient was placed in the dorsal lithotomy position and the pubis was prepped with Betadine solution.  BRIEF HISTORY:  This 70 year old male has a history of bladder neck contracture and proximal urethral stricture, status post radical retropubic prostatectomy for high-grade carcinoma of the prostate in the distant past, and urethral trauma secondary to elective carotid endarterectomy several years ago.  The patient has been self- catheterizing, but has recently been unable to get a catheter in.  He underwent cystoscopy and dilation in the office, with placement of a 12- French catheter.  This catheter has been removed and he has been successfully self-intermittent catheterizing, although with difficulty since then.  PROCEDURE IN DETAIL:  Cystourethroscopy shows a dense proximal urethral stricture and a bladder neck contracture.  Guidewire was passed through the strictured area and Cook balloon dilation was accomplished for 5 minutes, at 20 atmospheres  pressure.  Following this, cystoscopy was again accomplished, but the 28 sheath could not be passed in the bladder.  Therefore, the patient underwent dilation with the male sounds to a size 28-French, and then the 28 resectoscope sheath was passed into the proximal urethra, and then passed the bladder neck.  I then attached the Gyrus button electrode and vaporized the bladder neck and the proximal urethral stricture and then passed a guidewire into the bladder, and over this, passed a 20-French Council catheter.  This will be placed to both a leg bag as well as a stopper.  The patient was then awakened and taken to the recovery room in good condition.  He did have a B and O suppository, but was not given IV Toradol.     Kessie Croston I. Patsi Sears, M.D.     SIT/MEDQ  D:  12/26/2010  T:  12/26/2010  Job:  [id number]  Electronically Signed by Jethro Bolus M.D. on 01/20/2011 09:36:23 AM

## 2011-01-21 ENCOUNTER — Ambulatory Visit: Payer: Self-pay | Admitting: Cardiology

## 2011-01-30 NOTE — Progress Notes (Signed)
Summary: need to verfiy medication**2nd call**  Phone Note From Pharmacy Call back at [phone]   Caller: [facility] Summary of Call: Need to verify medication Ref# [id number]  Initial call taken by: Judie Grieve,  January 20, 2011 11:48 AM  Follow-up for Phone Call        2nd call Omer Jack  January 21, 2011 9:43 AM   Additional Follow-up for Phone Call Additional follow up Details #1::        returned phone call to [facility] needed to give verabal directiions potassiun 20 meq take 1 tab daily as directed. #  90 with 3 refills DAJ Additional Follow-up by: Burnett Kanaris, CNA,  January 21, 2011 4:02 PM

## 2011-02-10 NOTE — Consult Note (Signed)
  NAME:  Caleb Berry, Caleb Berry                ACCOUNT NO.:  [id number]  MEDICAL RECORD NO.:  [id number]           PATIENT TYPE:  I  LOCATION:  1508                         FACILITY:  [facility]  PHYSICIAN:  Tyannah Sane I. Quavis Klutz, M.D.DATE OF BIRTH:  1940-11-28  DATE OF CONSULTATION: DATE OF DISCHARGE:  12/30/2010                                CONSULTATION   HISTORY OF PRESENT ILLNESS:  This is a pleasant 70 year old white male with history of urethral stricture disease that was admitted on December 28, 2010, with fever and chills, status post recent urethral dilation and cystoscopy procedure on December 26, 2010.  He started with fever and chills Saturday morning which persisted and the patient was admitted for suspected urosepsis.  Urine and blood cultures have returned no growth.  The patient was on Cipro postoperatively at the time of his admission.  He was started on IV vancomycin and Rocephin on admission and has shown improvement in his white blood cell count.  Currently, the patient is afebrile and feels well.  PAST MEDICAL HISTORY:  Past medical history significant for coronary artery disease, status post CABG in 2000, hypertension, hyperlipidemia, gout, GERD, prostate cancer, nephrolithiasis, arthritis and neuropathy.  For a full review of past surgical,social and family history, please see dictated admission H and P in the patient's chart.  ALLERGIES:  The patient is allergic to CODEINE and ALEVE.  MEDICATIONS: 1. IV vancomycin dosed per pharmacy. 2. IV Rocephin 1 g daily. 3. Aspirin 81 mg. 4. Gabapentin 100 mg 2 p.o. q.i.d. 5. Hydrochlorothiazide 12.5 mg p.o. b.i.d. 6. Lopressor 50 mg p.o. b.i.d. 7. Nasonex 1 spray intranasal daily. 8. Potassium chloride 20 mEq p.o. b.i.d. 9. Prilosec 20 mg p.o. daily. 10.Zantac 150 mg p.o. nightly. 11.Vicodin 5/500 mg 1 p.o. q.6 h. p.r.n. 12.Ambien 10 mg p.o. nightly. 13.Zyrtec 10 mg p.o. b.i.d. p.r.n.  REVIEW OF SYSTEMS:  As listed above  in HPI and otherwise negative for a 10 organ system review of systems.  PHYSICAL EXAMINATION:  HEENT:  Atraumatic and normocephalic. ABDOMEN:  Soft, nontender and nondistended with normal bowel sounds. EXTREMITIES:  No cyanosis and no edema.  LABORATORY DATA:  White blood count at time of admission was noted at 16.4 on December 28, 2010 and rose to 17.0 on December 29, 2010. Currently, his white blood cell count is down to 11.4 on IV antibiotics. BUN and creatinine are noted at 10 and 1.46 respectively.  ASSESSMENT AND PLAN:  Suspected urosepsis:  Recommend continuing the patient on cefdinir 300 mg p.o. b.i.d. x10 days at discharge.  The patient is stable to discharge home.  However, he will need to contact the Urology office at [facility] for a followup appointment with Dr. Patsi Sears in 7-10 days.    ______________________________ Marcello Fennel, PA   ______________________________ Vonzell Schlatter. Patsi Sears, M.D.    AB/MEDQ  D:  12/30/2010  T:  12/31/2010  Job:  [id number]  Electronically Signed by Marcello Fennel PA on 02/06/2011 04:05:08 PM Electronically Signed by Jethro Bolus M.D. on 02/10/2011 12:34:42 PM

## 2011-02-11 LAB — POCT I-STAT 4, (NA,K, GLUC, HGB,HCT)
Glucose, Bld: 127 mg/dL — ABNORMAL HIGH (ref 70–99)
HCT: 40 % (ref 39.0–52.0)
Hemoglobin: 13.6 g/dL (ref 13.0–17.0)
Potassium: 3.9 mEq/L (ref 3.5–5.1)
Sodium: 137 mEq/L (ref 135–145)

## 2011-02-18 ENCOUNTER — Encounter: Payer: Self-pay | Admitting: Cardiology

## 2011-02-27 ENCOUNTER — Encounter: Payer: Self-pay | Admitting: Cardiology

## 2011-02-27 ENCOUNTER — Ambulatory Visit (INDEPENDENT_AMBULATORY_CARE_PROVIDER_SITE_OTHER): Payer: Medicare Other | Admitting: Cardiology

## 2011-02-27 DIAGNOSIS — I25810 Atherosclerosis of coronary artery bypass graft(s) without angina pectoris: Secondary | ICD-10-CM

## 2011-02-27 DIAGNOSIS — E7800 Pure hypercholesterolemia, unspecified: Secondary | ICD-10-CM

## 2011-02-27 DIAGNOSIS — I10 Essential (primary) hypertension: Secondary | ICD-10-CM

## 2011-02-27 DIAGNOSIS — I2510 Atherosclerotic heart disease of native coronary artery without angina pectoris: Secondary | ICD-10-CM

## 2011-02-27 LAB — BASIC METABOLIC PANEL
CO2: 28 mEq/L (ref 19–32)
Calcium: 9.2 mg/dL (ref 8.4–10.5)
Creatinine, Ser: 1.5 mg/dL (ref 0.4–1.5)
GFR: 47.75 mL/min — ABNORMAL LOW (ref >60.00)
Sodium: 139 mEq/L (ref 135–145)

## 2011-02-27 LAB — MAGNESIUM: Magnesium: 1.5 mg/dL (ref 1.5–2.5)

## 2011-02-27 NOTE — Assessment & Plan Note (Signed)
Not a statin candidate.  Historically high.  Has tried multiple things in past without success.

## 2011-02-27 NOTE — Assessment & Plan Note (Signed)
Currently controlled.  On HCTZ primarily for kidney stone prevention, but controls BP.  Will check BMET

## 2011-02-27 NOTE — Patient Instructions (Signed)
Your physician recommends that you have lab work today:  BMP  Your physician wants you to follow-up in: 6 MONTHS.  You will receive a reminder letter in the mail two months in advance. If you don't receive a letter, please call our office to schedule the follow-up appointment.  Your physician recommends that you continue on your current medications as directed. Please refer to the Current Medication list given to you today.

## 2011-02-27 NOTE — Assessment & Plan Note (Signed)
Grafts remain patent.  See cath report in [facility] for details.  Would continue medical therapy.

## 2011-02-27 NOTE — Progress Notes (Signed)
HPI: Caleb Berry is in for a follow up visit.  In general, he is doing pretty well from a cardiac standpoint.  He denies chest pain.  He does feel drained ever since undergoing another urethral procedure.  Cath study done in January revealed continued graft patency at this point in time.  He has felt tired since his repeat procedure for urethral stricture.  He denies any chest pain.    Current Outpatient Prescriptions  Medication Sig Dispense Refill  . allopurinol (ZYLOPRIM) 100 MG tablet Take 100 mg by mouth daily.      Marland Kitchen aspirin 81 MG tablet Take 81 mg by mouth daily.        . cetirizine (ZYRTEC) 10 MG tablet Take 10 mg by mouth as needed.       . gabapentin (NEURONTIN) 100 MG capsule Take 100 mg by mouth 4 (four) times daily.       . hydrochlorothiazide 50 MG tablet Take 25 mg by mouth 2 (two) times daily.       Marland Kitchen HYDROcodone-acetaminophen (VICODIN) 5-500 MG per tablet Take 1 tablet by mouth every 6 (six) hours as needed.        Marland Kitchen lisinopril (PRINIVIL,ZESTRIL) 2.5 MG tablet Take 1 tablet by mouth daily.      . metoprolol (LOPRESSOR) 50 MG tablet Take 1 tablet by mouth Twice daily.      . mometasone (NASONEX) 50 MCG/ACT nasal spray 2 sprays by Nasal route as needed.       Marland Kitchen omeprazole (PRILOSEC OTC) 20 MG tablet Take 20 mg by mouth daily.        . potassium chloride SA (K-DUR,KLOR-CON) 20 MEQ tablet Take 0.5 tablets by mouth Twice daily.      . ranitidine (ZANTAC) 150 MG capsule Take 150 mg by mouth every evening.          Allergies  Allergen Reactions  . Aleve   . Codeine   . Naproxen Sodium     Past Medical History  Diagnosis Date  . CAD (coronary artery disease)   . HTN (hypertension)   . History of prostate cancer   . Nephrolithiasis     Past Surgical History  Procedure Date  . Urethral stricture dilatation     Family History  Problem Relation Age of Onset  . Hypertension      History   Social History  . Marital Status: Married    Spouse Name: N/A    Number of  Children: N/A  . Years of Education: N/A   Occupational History  . pastor    Social History Main Topics  . Smoking status: Former Smoker    Types: Cigarettes    Quit date: 11/24/1968  . Smokeless tobacco: Not on file  . Alcohol Use: No  . Drug Use: No  . Sexually Active: Not on file   Other Topics Concern  . Not on file   Social History Narrative  . No narrative on file    ROS: Please see the HPI.  All other systems reviewed and negative.  PHYSICAL EXAM:  BP 126/74  Pulse 76  Resp 18  Ht 5\' 9"  (1.753 m)  Wt 172 lb (78.019 kg)  BMI 25.40 kg/m2  General: Well developed, well nourished, in no acute distress. Head:  Normocephalic and atraumatic. Neck: no JVD Chest: MS well healed. Lungs: Clear to auscultation and percussion. Heart: Normal S1 and S2.  No murmur, rubs or gallops.  Pulses: Pulses normal in all 4 extremities. Extremities:  No clubbing or cyanosis. No edema. Neurologic: Alert and oriented x 3.  EKG:  NSR.  Inferior MI of indeterminate age. No acute changes.    ASSESSMENT AND PLAN:

## 2011-03-07 ENCOUNTER — Telehealth: Payer: Self-pay

## 2011-03-07 DIAGNOSIS — I2510 Atherosclerotic heart disease of native coronary artery without angina pectoris: Secondary | ICD-10-CM

## 2011-03-07 NOTE — Telephone Encounter (Signed)
I left a message for the pt to call back about lab results. The pt's wife called back because the pt is unable to call our office today and he would like his results. I spoke with the pt's wife and made her aware of the pt's lab results.  The pt will start Magnesium Oxide OTC 400mg  daily.

## 2011-03-07 NOTE — Telephone Encounter (Signed)
Message copied by Julieta Gutting on Fri Mar 07, 2011 12:55 PM ------      Message from: Shawnie Pons      Created: Fri Mar 07, 2011  7:12 AM       Let him know this looks ok but I would consider adding one over the counter Magnesium oxide tab  (400) daily.  This might help him.  THx.  TS

## 2011-03-10 LAB — POCT I-STAT 4, (NA,K, GLUC, HGB,HCT)
Hemoglobin: 14.3 g/dL (ref 13.0–17.0)
Potassium: 4.5 mEq/L (ref 3.5–5.1)
Sodium: 139 mEq/L (ref 135–145)

## 2011-04-08 NOTE — Assessment & Plan Note (Signed)
[facility]                            CARDIOLOGY OFFICE NOTE   RICHARDS, PHERIGO                       MRN:          [id number]  DATE:03/15/2008                            DOB:          08/28/1941    Caleb Berry is in for follow-up.  To summarize, overall, he seems to  be doing reasonably well.  He was recently admitted to the hospital with  recurrent hematuria.  [place]  developed inability to urinate.  He subsequently had a Foley placed, and  had multiple clots. He had to go to the operating room for treatment.  They tried to irrigate the bladder, and a lot was done.  Subsequent to  that, they have elected to stop his Plavix, and his bleeding has  apparently resolved.  He was a couple of days after procedure done by  Dr. Patsi Sears.  He now is improved.  He is off Plavix.   CURRENT MEDICATIONS:  1. Allopurinol 100 mg daily.  2. Lopressor 50 mg p.o. b.i.d.  3. Prilosec 20 mg daily.  4. Zantac over-the-counter 1 q.h.s.  5. K-Dur 20 mEq daily.  6. Aspirin 81 mg daily.  7. B12 daily.  8. Hydrochlorothiazide 25 mg 1/2 tablet b.i.d.  9. Lisinopril 2.5 mg daily.  10.Lyrica 1 in the morning, 1 at noon and 2 at night.   PHYSICAL EXAMINATION:  The blood pressure is 126/74, the pulse is 78,  the lung fields are clear.  There is a normal first and second heart  sound and no significant murmur.  There is no extremity edema.   Electrocardiogram is within normal limits.   IMPRESSION:  1. Coronary artery disease status post coronary artery bypass graft      surgery with multivessel disease, with prior percutaneous stenting      including use of a drug-eluting stent proximally.  2. Recurrent urinary tract problems with recent bleeding now stable on      81 mg aspirin alone.  3. Hypertension.  4. Long history of STATIN intolerance.  5. Long history of nephrolithiasis.  6. History of prostate cancer  7. Status  post coronary artery bypass grafting in 2000.   PLAN:  1. Continue current medical regimen.  2. Given the time frame of previous stent deployment in the circumflex      artery and known patency, we are going at the present time with a      recent urinary bleeding to recommend continuation of aspirin alone.      Issues of stenting and have been reviewed with Rev. Manson Passey.   ADDENDUM:  He will return to clinic in 3 months     Oddie Bottger. Riley Kill, MD, [facility]  Electronically Signed    TDS/MedQ  DD: 03/17/2008  DT: 03/17/2008  Job #: [id number]

## 2011-04-08 NOTE — Op Note (Signed)
NAME:  Caleb Berry, Caleb Berry                ACCOUNT NO.:  [id number]   MEDICAL RECORD NO.:  [id number]          PATIENT TYPE:  AMB   LOCATION:  NESC                         FACILITY:  [facility]   PHYSICIAN:  Sigmund I. Patsi Sears, M.D.DATE OF BIRTH:  1941/01/18   DATE OF PROCEDURE:  02/25/2008  DATE OF DISCHARGE:                               OPERATIVE REPORT   DICTATED BY:  Maudie Flakes, MD.   PREOPERATIVE DIAGNOSIS:  Bladder outlet obstruction, urethral stricture.   POSTOPERATIVE DIAGNOSIS:  Bladder outlet obstruction, urethral  stricture.   PROCEDURE:  1. Cystourethroscopy.  2. PK electrode vaporization and incision of a urethral stricture.   RESIDENT PHYSICIAN:  Maudie Flakes, MD.   ANESTHESIA:  General.   INDICATIONS FOR PROCEDURE:  Caleb Berry is a 70 year old, white male with  a past medical history positive for high risk prostate cancer status  post radical retropubic prostatectomy.  He did well from a urinary  standpoint for quite some time but had some Foley trauma at some point  and developed recurrent urethral stricture disease.  He had a UroLume  implanted at some point.  He was recently seen by Dr. Patsi Sears for  inability to urinate and was found to have obstructive urethral  strictures again, and likewise he was recommended operative management  of these.  All risks, consequences, and benefits were discussed with him  preoperatively and informed consent was obtained.   PROCEDURE IN DETAIL:  The patient was brought to the operating room and  placed in a supine position, he was correctly identified by his wrist  band, an appropriate timeout was taken, IV antibiotics were given, a  general anesthesia was delivered, once adequately anesthetized he was  placed in a dorsal lithotomy position with great care taken to minimize  the risk of a peripheral neuropathy or a compartment syndrome.  His  perineum was prepped and draped sterilely.  We began our procedure by  performing a  rigid urethroscopy.  His penile urethra was normal, a  slight constricted narrowing in the bulbar urethra but had more  significant stricture disease in his prostatic urethra that would not  permit passage of the cystoscope.  Likewise, we changed the cystoscope  out, used a #27-French Olympus resectoscope with an electrode button.  We applied the electrode to the stricture and vaporized some of the  tissue in the 6 o'clock position, but this did not result in significant  expansion of this stricture.  This was done at multiple other areas with  similar results.  We then removed the PK button and used a hot Collins  knife, again the urethral stricture was incised, but the urethra did not  dilate tremendously.  Then we replaced the cystoscope and were able to  advance it into the level of the bladder.  The urothelium appeared  normal; however, there were some trabeculations, no frank cellular  diverticula were appreciated.  We then removed the cystoscope and sheath  and replaced the resectoscope.  We were able to advance it into the  bladder, also.  The bladder was drained and refilled, we then backed the  resectoscope out into the prostatic urethra and found significant dense  scarring here.  We used the PK electrode and vaporized some of this  tissue and got down to his UroLume device.  Of note, his densest  strictured area was just proximal to the UroLume.  We subsequently  performed circumferential tissue vaporization of the tissue that had  grown through the UroLume.  We addressed all bleeders with  electrocautery.  There was an area just distal to the UroLume device  that the [employer] had exposed some corpus spongiosum in the 6  o'clock position, but otherwise there was no significant abnormality  appreciated.  Once all areas were hemostatic, we removed the  resectoscope, placed a #20-French catheter into his bladder, inflated  the balloon at 30 ml of sterile water, drained the  clear urine, and this  marked the end of our procedure.  There were no complications, he  tolerated the procedure well, and Dr. Patsi Sears was present and  participated in all aspects of the case.  He did have some transient EKG  changes during the case and he will be sent to the recovery room to have  an EKG done.  He is otherwise in stable condition.      Caleb Schools, MD      Sigmund I. Patsi Sears, M.D.  Electronically Signed    JR/MEDQ  D:  02/25/2008  T:  02/25/2008  Job:  [id number]

## 2011-04-08 NOTE — Discharge Summary (Signed)
NAME:  Caleb Berry, Caleb Berry                ACCOUNT NO.:  [id number]   MEDICAL RECORD NO.:  [id number]          PATIENT TYPE:  INP   LOCATION:  1403                         FACILITY:  [facility]   PHYSICIAN:  Martina Sinner, MD DATE OF BIRTH:  01/11/1941   DATE OF ADMISSION:  03/02/2008  DATE OF DISCHARGE:  03/04/2008                               DISCHARGE SUMMARY   ADMISSION DIAGNOSIS:  Gross hematuria requiring clot evacuation,  postoperative vaporization of urethral stricture.   DISCHARGE DIAGNOSIS:  Gross hematuria requiring clot evacuation,  postoperative vaporization of urethral stricture.   ADMITTING DOCTOR:  Dr. Lorin Picket MacDiarmid.   [name] was admitted post bladder procedure.  He had a  vaporization of an intrastent stricture by Dr. Patsi Sears 1 week  previous.  He was back on his Plavix and had gross hematuria.  This  could not be controlled, due to discomfort, in the emergency room with  clot evacuation and irrigation.  The patient was, therefore, taken to  the operating room.   The procedure went very well and has been dictated.   [name] was treated for 48 hours postprocedure in the hospital with  CBI and the urine was very clear even after stopping the CBI within 24  hours.  He was also kept in because he had some mild EKG changes.  The  cardiology team saw him and we decided to keep him off his Plavix for 10  days but we put him back on his aspirin.  His EKG changes were not  significant.   He was discharged home stable with a catheter in place.  He was going to  have his catheter removed by Dr. Patsi Sears 3 days postdischarge.   He went home on his home medication including aspirin.  I also sent him  home on ciprofloxacin.  His laboratory tests were within normal limits.  He was not having any chest pain.   [name] will follow up with Dr. Patsi Sears 3 days post discharge.           ______________________________  Martina Sinner, MD  Electronically  Signed     SAM/MEDQ  D:  03/10/2008  T:  03/10/2008  Job:  [id number]

## 2011-04-08 NOTE — Op Note (Signed)
NAME:  Caleb Berry, Caleb Berry                ACCOUNT NO.:  [id number]   MEDICAL RECORD NO.:  [id number]          PATIENT TYPE:  INP   LOCATION:  1235                         FACILITY:  [facility]   PHYSICIAN:  Martina Sinner, MD DATE OF BIRTH:  1940-12-06   DATE OF PROCEDURE:  03/02/2008  DATE OF DISCHARGE:                               OPERATIVE REPORT   PREOPERATIVE DIAGNOSIS:  Gross hematuria due to urethral bleeding.   POSTOPERATIVE DIAGNOSIS:  Gross hematuria due to urethral bleeding.   SURGERY:  Cystoscopy, evacuation of clots, insertion of irrigating  catheter.   [name] has a Urolume stent.  He had a cystoscopic procedure  with ablation of stricture by Dr. Patsi Sears approximately a week ago.  He went back on Plavix two to three days post surgery.  He bled on  Tuesday and again today and came to the emergency room with clot  retention.  16-French Foley catheter followed by a 20-French 3-way  catheter and multiple irrigations did not clear him.  He is therefore  taken to the operating room because he became quite uncomfortable.   The patient is prepped and draped in the usual fashion.  His  preoperative blood work was normal.  His hemoglobin was greater than 13.  He was given preoperative ciprofloxacin.   With the patient under anesthesia I initially used a 17-French scope and  easily passed through the penile urethra along the bulbar urethra  through the open Urolume stent and into the bladder.  I irrigated a  moderate amount of bladder clots until the bladder was completely clear  and there was no active bleeding in the bladder.  He had a radical  prostatectomy.  There is no bleeding from the bladder neck.  There was a  crater in the bulbar urethra distal to the presumed Urolume stent, but  fortunately it was not bleeding any longer.   I looked a few times and there is no active bleeding.  A 24-French 3-way  catheter easily was placed on the bladder.  I stayed in the  operating  room and again there was clear inflow and outflow of irrigation fluid.  The catheter was irrigated some more and the patient was taken to  recovery room.  His Plavix will be stopped.   Prior to surgery.  He had a few EKG changes.  He has a significant  cardiac history.  We are holding his Plavix.  I will have his cardiology  team see him first thing in the morning but we are going to keep him in  the intensive care unit overnight.           ______________________________  Martina Sinner, MD  Electronically Signed     SAM/MEDQ  D:  03/02/2008  T:  03/03/2008  Job:  [id number]

## 2011-04-08 NOTE — Assessment & Plan Note (Signed)
[facility]                            CARDIOLOGY OFFICE NOTE   ABDULRAHEEM, PINEO                         MRN:          [id number]  DATE:08/10/2007                            DOB:          23-Oct-1941    Caleb Berry is in for a 1-year follow-up.  In general, he has been  stable.  He did say over the last 3-4 days he has been eating nonstop.  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  He denies chest pain or significant shortness of breath.  He inquired about his Plavix and we had a long discussion about this.  The patient had been totally intolerant to any type of statin and other  lipid-lowering drugs.  He denies current chest pain.  His last  catheterization in 2006 revealed patent grafts.  He did have treatment  of his AV circumflex with both a drug-eluting and non-drug-eluting  stent.   On his physical today the blood pressure is 159/99, on repeat by me it  was 160/80, a pulse of 76.  Lung fields are clear.  The cardiac rhythm is regular.  Extremities revealed no edema.   Overall, the reverend is stable.  I plan to see him back in follow-up in  about 6 months.  Should he have any problems in the interim, he his to  call me.     Arturo Morton. Riley Kill, MD, [facility]  Electronically Signed    TDS/MedQ  DD: 08/10/2007  DT: 08/11/2007  Job #: [id number]

## 2011-04-08 NOTE — Cardiovascular Report (Signed)
Caleb Berry, Caleb Berry                ACCOUNT NO.:  [id number]   MEDICAL RECORD NO.:  [id number]          PATIENT TYPE:  INP   LOCATION:  6522                         FACILITY:  [facility]   PHYSICIAN:  Arturo Morton. Riley Kill, MD, FACCDATE OF BIRTH:  1940/12/18   DATE OF PROCEDURE:  10/27/2007  DATE OF DISCHARGE:  10/28/2007                            CARDIAC CATHETERIZATION   ADDENDUM:  Regarding cardiac catheterization October 27, 2007:  There is  an area of about 40% narrowing between the widely patent proximal  circumflex marginal stent and the small distal stent.  This does not  appear to be hemodynamically significant.      Arturo Morton. Riley Kill, MD, [facility]  Electronically Signed     TDS/MEDQ  D:  11/27/2007  T:  11/27/2007  Job:  [id number]

## 2011-04-08 NOTE — Assessment & Plan Note (Signed)
[facility]                            CARDIOLOGY OFFICE NOTE   TORRY, ISTRE                         MRN:          [id number]  DATE:11/01/2007                            DOB:          [birth year]    Caleb Berry is in for a followup after discharge from hospital.  He  came in with some hypertension and recurrent chest pain.  He underwent  cardiac catheterization which demonstrated patency of the internal  mammary to the LAD, patency of the saphenous vein graft to the diagonal  and OM, patency of the saphenous vein graft sequentially to the distal  right and PD.  He has scattered diffuse distal disease.  His stent in  the proximal circumflex was widely patent his stent that had repeat  cutting balloon intervention distally was still open and did not appear  particularly tight.  He does have distal LAD disease.  His chest pain is  resolved, we added lisinopril to his regimen and cut his  hydrochlorothiazide in half.   MEDICATIONS:  1. Allopurinol 100 mg daily.  2. Lopressor 50 mg b.i.d.  3. Prilosec 20 mg daily.  4. Zantac over the counter.  5. K-Dur 20 mEq daily.  6. Aspirin 81 mg daily.  7. B-12 daily.  8. Plavix 75 mg daily.  9. Lyrica 50 mg t.i.d.  10.Ethicon 25 b.i.d.  11.Hydrochlorothiazide 25 mg one-half tablet b.i.d.  12.Lisinopril 2.5 daily.   EXAMINATION:  The blood pressure is 124/80, the pulse is 73.  LUNGS:  Fields are clear.  CARDIAC:  Rhythm was regular.  GROIN:  Well-healed.   An EKG is entirely within normal limits.   IMPRESSION:  1. Coronary artery disease with prior coronary artery bypass graft      surgery, grafts intact, left-sided diffuse distal disease.  2. Hypercholesterolemia, WITH STATIN INTOLERANCE AND ALTERNATIVE DRUG      INTOLERANCE AS WELL.  3. Hypertension.  4. History of recurrent hypokalemia.  5. Hypertension.  6. Recurrent kidney stones.  7. Bladder dysfunction.  8. Mild hyperglycemia with  recent glucose of 151.   PLAN:  1. Basic metabolic profile.  2. Return to clinic in 3 weeks.  3. Continue current medical regimen.     Arturo Morton. Riley Kill, MD, [facility]  Electronically Signed    TDS/MedQ  DD: 11/01/2007  DT: 11/01/2007  Job #: [id number]

## 2011-04-08 NOTE — Discharge Summary (Signed)
Caleb Berry, Caleb Berry                ACCOUNT NO.:  [id number]   MEDICAL RECORD NO.:  [id number]          PATIENT TYPE:  INP   LOCATION:  6522                         FACILITY:  [facility]   PHYSICIAN:  Arturo Morton. Riley Kill, MD, FACCDATE OF BIRTH:  07-29-1941   DATE OF ADMISSION:  10/26/2007  DATE OF DISCHARGE:  10/28/2007                               DISCHARGE SUMMARY   PRIMARY CARDIOLOGIST:  Arturo Morton. Riley Kill, MD, [facility]   PRIMARY CARE PHYSICIAN:  Mellissa Kohut, MD   UROLOGIST:  Sigmund I. Patsi Sears, M.D.   PROCEDURE PERFORMED:  During hospitalization:  Cardiac catheterization  per Dr. Bonnee Quin on October 27, 2007, revealing patent stents to the  native circumflex, patent IMA to LAD, SVG to diagonal, SVG to OM, SVG to  right coronary artery and PDA, diffuse disease.  Normal LV function.   DISCHARGE DIAGNOSES:  1. Coronary artery disease.      a.     Coronary artery bypass grafting 2000 with left internal       mammary artery to left anterior descending, saphenous vein graft       to diagonal, saphenous vein graft to obtuse marginal, saphenous       vein graft to right coronary artery and patent ductus arteriosus.      b.     May 2006, percutaneous intervention of the circumflex with       drug-eluting stent, proximally and nondrug-eluting stent placed       distally.      c.     During cardiac catheterization percutaneous coronary       intervention in 2006, with distal circumflex in-stent stenosis did       undergo percutaneous coronary intervention.  There was marked ST       elevation and reproduction of symptoms with each balloon inflation       despite small vessel distally at that time.  The patient was also       in the study drug Triton and the patient was noted to have       continued patency of previously placed proximal stent and       previously noted grafts.   SECONDARY DIAGNOSES:  1. Intolerance to statins.  2. Peripheral neuropathy.  3. History of hypokalemia.  4.  Prostate cancer status post prostatectomy in 1996.  5. Gastroesophageal reflux disease.  6. Anklyosing spondylitis.  7. Multiple kidney stones.  8. Left carotid endarterectomy in 2002.  9. History of proximal urethral strictures status post      cystourethroscopy with balloon urethral dilatation by Dr.      Patsi Sears with routine in-and-out catheterization secondary to      ongoing strictures.   HOSPITAL COURSE:  A very pleasant 70 year old Caucasian male with known  history of CAD status post bypass surgery in 2001 and status post  cardiac catheterization and PCI in 2006 and repeat cardiac  catheterization and PCI August of 2006.  The patient was feeling in his  usual state of health until recent Thanksgiving weekend, November 2008.  He stated that he felt bad all over  and noted that his blood pressure  had gone up to 165/93.  Began to have some episodes of chest tightness  and felt sluggish.  He rated his chest discomfort on a 5 on a scale of 1  to 10.  He had no associated symptoms.  He did notice that his blood  pressure remained elevated. Over the weekend, the chest discomfort  radiated into his bilateral neck and down his arm.  He took a  nitroglycerin without relief.  He tried to rest  he took 2 aspirin and  it did relieve the pain mildly.  The morning after awakening on Sunday,  his chest discomfort returned, blood pressure remained elevated.  He  took nitroglycerin with relief and secondary to the recurrence of the  chest similar to the pain prior to his interventions, he came to the  emergency room.  The patient was also given Zofran for mild nausea  during his ER visit.   The patient was seen and examined by Dorian Pod, ACNP, in the  emergency room along with Dr. [name].  The patient was admitted  to rule out myocardial infarction.  He was scheduled for a cardiac  catheterization the following day.  The patient was intermittently  placed on nitroglycerin and  heparin.   The patient did undergo cardiac catheterization per Dr. Bonnee Quin on  October 27, 2007.  Please see Dr. Rosalyn Charters thorough cardiac  catheterization dictation for more details.  It was found that his  grafts were patent and along with his stents.  The patient was monitored  closely over the next 2 days for continuation of chest discomfort.  The  patient had no EKG changes acutely before or after cardiac  catheterization.  The patient's cardiac enzymes were found to be  negative.  During hospitalization, lisinopril was started at 2.5 mg 1  p.o. daily.  His potassium was held secondary to normal potassium as he  had been on it t.i.d. at home.  The patient's hydrochlorothiazide was  also decreased to 25 mg daily from 50 mg daily.  The patient was seen  and examined by Dr. Bonnee Quin on day of discharge, found to be stable.  He will have a follow-up appointment within 5 days with Dr. Riley Kill with  a BMET and medications have been adjusted with explanation to the  patient.   Discharge labs:  Point of care markers on admission revealed a troponin  of less than 0.05, CK-MB of 1.7 and a myoglobin of 123.  Subsequent  cardiac markers were again found negative at 0.01 on his troponin and  0.02 respectively.  BMET on discharge, sodium 138, potassium 4.1,  chloride 100, CO2 30, glucose 97, BUN 18, creatinine 1.26, hemoglobin  A1c 6.4, hemoglobin 14.0, hematocrit 43.2, white blood cells 8.7,  platelets 354, cholesterol 290, triglycerides 289, HDL 37, LDL 195.  Chest x-ray dated October 26, 2007, revealed post CABG with no active  disease.  EKG on discharge revealed normal sinus rhythm, ventricular  rate of 75 beats/min.   DISCHARGE MEDICATIONS:  1. Metoprolol 50 mg 1 twice a day.  2. Allopurinol 100 mg daily.  3. HCTZ 25 mg daily (new lower dose).  4. Pepcid 20 mg twice a day.  5. Plavix 75 mg daily.  6. Bethanechol 10 mg 4 times a day.  7. Gabapentin 50 mg 3 times a day.  8.  Lisinopril 2.5 mg daily (new prescription).  9. Aspirin 324 daily.  10.Nitroglycerin 0.4 mg under tongue  as needed p.r.n. chest pain.  11.Ambien 10 mg p.r.n. insomnia.  12.Potassium 20 mEq once a day (new lower dose).  13.Hydrocodone/APAP 5/500 twice a day p.r.n.   ALLERGIES:  INTOLERANCE TO CODEINE, ALEVE WITH SEVERE MUSCLE ACHES AND  JOINT PAIN AND ALONG WITH INTOLERANCE TO FIBRATES, QUESTRAN AND WELCHOL.   FOLLOWUP:  1. The patient will be seen by Dr. Bonnee Quin on November 01, 2007, at      9 a.m. A BMET will be drawn at that time.  2. The patient has been given post cardiac catheterization      instructions with particular emphasis on the right groin site for      evidence of bleeding, hematoma and infection.  3. The patient is to follow up with his primary care physician, Dr.      Mellissa Kohut for continued medical management.  4. He is to follow up with Dr. Patsi Sears, urologist, for continued      management as well.  The patient verbalizes understanding.   Time spent with the patient to include physician time 45 minutes.      Bettey Mare. Lyman Bishop, NP      Arturo Morton. Riley Kill, MD, [facility]  Electronically Signed    KML/MEDQ  D:  10/28/2007  T:  10/28/2007  Job:  [id number]

## 2011-04-08 NOTE — H&P (Signed)
NAME:  Caleb Berry, Caleb Berry                ACCOUNT NO.:  [id number]   MEDICAL RECORD NO.:  [id number]          PATIENT TYPE:  INP   LOCATION:  1235                         FACILITY:  [facility]   PHYSICIAN:  Martina Sinner, MD DATE OF BIRTH:  [birth year]   DATE OF ADMISSION:  03/02/2008  DATE OF DISCHARGE:                              HISTORY & PHYSICAL   ADMISSION DIAGNOSES:  1. Postoperative hematuria and evacuation of clots.  2. Mild electrocardiogram changes.     [name] had an endoscopic procedure and laser therapy an intrastent  stricture by Dr. Patsi Sears 8 days ago.  It was a UroLume stent.  His  Plavix had been stopped for approximately 3 days.  He bled on Tuesday  and ended up coming in and clot retention today in the emergency room.  He was stable but quite uncomfortable.   A 16-French catheter was irrigated by the emergency staff and I  irrigated a 20-French catheter.  Larger catheters could not be inserted.   I evacuate clots for almost an hour.  He finally became uncomfortable  when I tried to hook up a three-way system.  He was, therefore, taken to  the operating room after consent.   The procedure went very well and this has been dictated.   The patient will be admitted for observation for 48 hours.  He had mild  EKG changes based on Dr. Rica Mast observing his EKG before he went to  sleep and comparing it to the December EKG.  The patient complained of  significant retrosternal chest pain on Monday that radiated to his left  arm associated with sweating, but he did not take his nitroglycerin and  did not report this.  He is not having any symptoms now.   I admitted him to the intensive care unit for observation.  I held his  Plavix and aspirin.  I will have his cardiology team see him in the  morning.  Dr. Rica Mast thought this was fine.  I will keep an eye on his  hemoglobin.   PAST HISTORY:  1. Cancer of the prostate.  2. Radical prostatectomy.  3. Significant  heart disease.  4. Hypertension.  5. Hypogonadism.  6. He has had prior cystoscopic procedures.   CURRENT MEDICATIONS:  Allopurinol, AndroGel, aspirin, bethanechol,  hydrochlorothiazide, lidocaine, metoprolol, Neurontin,  Plavix,  potassium, Prilosec, tramadol, Trimox and Ultram.   MEDICATION ALERT:  No alert.   ALLERGIES:  ALEVE AND CODEINE.   FAMILY HISTORY:  No family history of urologic disorder.   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   REVIEW OF SYSTEMS:  The rest of review of systems is negative.   PHYSICAL EXAMINATION:  GENERAL APPEARANCE:  When I first examined him,  he was in minimal to no distress.  VITAL SIGNS:  His vitals were normal.  SKIN:  Warm.  No rashes  RESPIRATORY:  Breaths quiet.  ABDOMEN:  I do not think I could palpate his bladder suprapubically.  GENITOURINARY:  A 16-French Foley catheter is in place.  Male genitalia  normal.  LYMPHATIC:  No inguinal adenopathy.  NEUROMUSCULAR:  Normal sensation to touch.  MUSCULOSKELETAL:  Normal leg and arm movements.   The patient was admitted to the hospital for observation.  I suspect he  will be in for approximately 48 hours observation.           ______________________________  Martina Sinner, MD  Electronically Signed     SAM/MEDQ  D:  03/02/2008  T:  03/03/2008  Job:  [id number]

## 2011-04-08 NOTE — Assessment & Plan Note (Signed)
[facility]                            CARDIOLOGY OFFICE NOTE   Caleb Berry                         MRN:          [id number]  DATE:12/22/2007                            DOB:          [birth year]    Caleb Berry is in for followup.  Cardiac-wise he seems to be doing  extremely well.  He says that when this occurred in December, he thought  he was under quite a bit of stress.  Apparently some money had been  misapplied at [employer], by one of his employees; and he was under  quite a bit of stress at that time.  He is scheduled to have a urologic  procedure tomorrow by Dr. Patsi Sears, and has been taken off of aspirin  but has remained on Plavix.   MEDICATIONS INCLUDE:  1. Allopurinol 100 mg daily.  2. Lopressor 50 mg p.o. b.i.d.  3. Prilosec 20 mg daily.  4. Zantac over-the-counter one nightly.  5. K-Dur 20 mEq daily.  6. Aspirin 81 mg daily.  7. B12 daily.  8. Plavix 75 mg daily.  9. Lyrica 150 mg t.i.d.  10.Ethicon 25 mg b.i.d.  11.Hydrochlorothiazide 25 mg 1/2 tablet b.i.d.  12.Lisinopril 2.5 daily.   PHYSICAL EXAMINATION:  On physical the blood pressure is 128/70, pulse  of 76.  The lung fields are clear.  The cardiac rhythm is regular.  No murmur is appreciated.   Overall Caleb Berry is doing well.  He will continue on the same  medications.  He will have his urologic procedure tomorrow.  He is not  on a statin, but these have been tried in multiple settings in the past  without much luck.  We will see him back in followup in 4-6 months.     Arturo Morton. Riley Kill, MD, [facility]  Electronically Signed    TDS/MedQ  DD: 12/22/2007  DT: 12/23/2007  Job #: [id number]

## 2011-04-08 NOTE — Assessment & Plan Note (Signed)
[facility]                            CARDIOLOGY OFFICE NOTE   RANEY, KOEPPEN                       MRN:          [id number]  DATE:12/25/2008                            DOB:          [birth year]    Caleb Berry is in for followup.  In general, he is doing relatively  well.  He has had recent urologic surgery.  He has had no chest pain  during that interval.  He did have some laboratory studies done at time.  His hemoglobin is 14, potassium 4.8, glucose 108.  He denies progressive  symptoms.   CURRENT MEDICATIONS:  1. Allopurinol 100 mg daily.  2. Lopressor 50 mg p.o. b.i.d.  3. Prilosec 20 mg daily.  4. Zantac over the counter.  5. K-Dur 20 mEq daily.  6. Aspirin 81 mg daily.  7. B12 daily.  8. __________ 25 mg p.o. b.i.d.  9. Hydrochlorothiazide 25 mg one-half b.i.d.  10.Lisinopril 2.5 daily.  11.Lyrica 1 in the morning, 1 at noon, and 2 in the evening.   On physical, he is alert and oriented, in no distress.  Blood pressure  145/80, pulse 80.  Lung fields are clear.  Cardiac, rhythm is regular.  There is median sternotomy that is well healed.  There is no significant  murmur.  There is no lower extremity edema.   IMPRESSION:  1. Coronary artery disease, status post coronary bypass graft surgery      in 2000 with prior percutaneous stenting including use a drug-      eluting stent proximally.  2. Recurrent urinary tract problems with again recent surgery.  3. Hypertension.  4. Long history of statin intolerance.  5. History of nephrolithiasis.  6. History of prostate cancer.   PLAN:  1. Return to clinic in 6 months.  2. Continue current medical regimen.     Arturo Morton. Riley Kill, MD, [facility]  Electronically Signed    TDS/MedQ  DD: 12/25/2008  DT: 12/26/2008  Job #: [id number]

## 2011-04-08 NOTE — Op Note (Signed)
NAME:  CORLEY, MAFFEO                ACCOUNT NO.:  [id number]   MEDICAL RECORD NO.:  [id number]          PATIENT TYPE:  AMB   LOCATION:  NESC                         FACILITY:  [facility]   PHYSICIAN:  Sigmund I. Patsi Sears, M.D.DATE OF BIRTH:  Dec 09, 1940   DATE OF PROCEDURE:  12/23/2007  DATE OF DISCHARGE:                               OPERATIVE REPORT   PREOPERATIVE DIAGNOSES:  Proximal urethral stricture.   POSTOPERATIVE DIAGNOSES:  Proximal urethral stricture.   OPERATION:  Cystourethroscopy, transurethral balloon dilation of the  urethral stricture.   SURGEON:  Dr. Patsi Sears.   ANESTHESIA:  General LMA.   PREPARATION:  After appropriate preanesthesia, the patient is brought to  the operating room, placed on the operating table in the dorsal supine  position where general LMA anesthesia was introduced.  He was then  replaced in dorsal lithotomy position where the pubis was prepped with  Betadine solution, and draped in the usual fashion.   REVIEW OF HISTORY:  This 70 year old male has a history of radical  prostatectomy for prostate cancer, followed by urethral trauma when the  Foley catheter was inserted for carotid surgery.  The patient has  recurrent proximal urethral stricture. The patient self caths, and is  unable to get the catheter in his bladder at this time.  Cystoscopy in  the office shows recurrence of proximal urethral stricture.  He is now  for balloon dilation.   DESCRIPTION OF PROCEDURE:  Cystourethroscopy again shows proximal  urethral stricture, this is photo documented.  A guidewire was passed  across the stricture area, and the [facility] balloon dilator was passed  across the stricture, into the bladder.  It is inflated to 18  atmospheres and left for 3 minutes.  Repeat cystoscopy shows excellent  balloon dilation of the stricture, with no bleeding (the patient is on  Plavix). The bladder appears otherwise normal. There is no evidence of  bladder stone, tumor,  or diverticular formation.   The patient is then awakened and taken to the recovery room in good  condition.      Sigmund I. Patsi Sears, M.D.  Electronically Signed     SIT/MEDQ  D:  12/23/2007  T:  12/23/2007  Job:  [id number]

## 2011-04-08 NOTE — Cardiovascular Report (Signed)
NAMEDAVIAN, HANSHAW                ACCOUNT NO.:  [id number]   MEDICAL RECORD NO.:  [id number]          PATIENT TYPE:  INP   LOCATION:  6522                         FACILITY:  [facility]   PHYSICIAN:  Arturo Morton. Riley Kill, MD, FACCDATE OF BIRTH:  1941-07-18   DATE OF PROCEDURE:  10/27/2007  DATE OF DISCHARGE:  10/28/2007                            CARDIAC CATHETERIZATION   INDICATIONS:  Honorio Devol is well-known to me.  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  More recently his blood pressure has been up, and he has had some  recurrent chest discomfort.  Cardiac catheterization was recommended.   PROCEDURE:  1. Left heart catheterization.  2. Selective coronary arteriography.  3. Saphenous vein graft angiography x3.  4. Selective left internal mammary angiography.   DESCRIPTION OF PROCEDURE:  The procedure was performed from the right  femoral artery using 5-French catheters.  There were no complications,  and he tolerated the procedure well.  He was taken to the holding area  in satisfactory clinical condition.   HEMODYNAMIC DATA:  1. Central aortic pressure 110/51, mean 75.  2. Left ventricular pressure 115/10.  3. No gradient pullback across aortic the valve.   ANGIOGRAPHIC DATA:  1. Ventriculography was done in the RAO projection.  Overall systolic      function was preserved, and there were no segmental wall motion      abnormalities.  2. The left main coronary artery is free of critical disease.  3. The LAD demonstrates diffuse in-stent renarrowing proximally.      There are 2 small diagonal branches, then essentially no flow      beyond this point.  4. The saphenous vein graft to the diagonal is intact.  It tapers      distally, inserts into a small somewhat diffusely irregular      diagonal which fills retrograde into the mid left anterior      descending  artery with multiple septal perforators.  This area      appears to be diffusely diseased.  5. The internal mammary to the distal left anterior descending artery      appears to be patent and functioned normally.  There is good flow      to the distal left anterior descending artery.  6. The saphenous vein graft to the intermediate vessel appears to be      intact with good flow distally.  7. The circumflex provides a large marginal branch that has previously      been intervened upon.  There is a long stent with less than 20%      narrowing and smooth contours throughout the stent.  Distal to this      there is a 40% area of narrowing.  There is a very small stent      placed at the distal end of this vessel prior to its trifurcation.      This has been previously redilated.  It appears to be about 40-50%,      but does  not appear to be critically involved  8. The right coronary artery is a diffusely diseased, small caliber      vessel with 80% proximal mid narrowing and 90% distal narrowing      with diffuse narrowing throughout.  There is evidence of      competitive flow distally.  9. The saphenous vein graft to the PD PL appears to be intact.  There      is a fair amount of competitive flow which makes filling of either      branch difficult.  This fills into the posterolateral segment which      is diffusely irregular.   CONCLUSION:  1. Preserved left ventricular function.  2. Continued patency of the internal mammary to the LAD.  3. Continued patency of the saphenous vein grafts to the diagonal and      intermediate.  4. Continued patency of the saphenous vein graft to the distal right      coronary artery and posterior descending, both of which are      diseased.  5. Continued patency of the stent to the circumflex with patency of      the small distal stent as noted above.   DISPOSITION:  We will recommend continued medical therapy at the present  time.  The overall  appearance of the grafts remain good.      Arturo Morton. Riley Kill, MD, [facility]  Electronically Signed     TDS/MEDQ  D:  11/27/2007  T:  11/27/2007  Job:  [id number]   cc:   CV Laboratory

## 2011-04-08 NOTE — Consult Note (Signed)
NAME:  Caleb Berry, Caleb Berry                ACCOUNT NO.:  [id number]   MEDICAL RECORD NO.:  [id number]          PATIENT TYPE:  INP   LOCATION:  1235                         FACILITY:  [facility]   PHYSICIAN:  Caleb Sans. Wall, MD, FACCDATE OF BIRTH:  08/24/1941   DATE OF CONSULTATION:  03/03/2008  DATE OF DISCHARGE:                                 CONSULTATION   I was asked by Dr. Alfredo Berry to evaluate Caleb Berry to see if  we could stop his Plavix because of persistent GU bleeding.   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 Bonnee Berry in our group.   He has had recent bladder outlet obstruction and urethral stricture.  Underwent cystourethroscopy and PK electrode vaporization and incision  of the urethral stricture by Caleb Berry on February 25, 2008.  He went  home and unfortunately started having bleeding this past Tuesday.  It  had stopped but then it burst loose again on Thursday evening.   Dr. Sherron Berry had to take him back to the OR last night and do  cystoscopy and evacuation of clots that were obstructing his urethra.  He now has a irrigating catheter.  Dr. Sherron Berry called me this morning  to see if we could stop his Plavix until hemostasis is achieved.   In regard to his cardiac history, he has known coronary disease and has  had previous coronary bypass grafting.  His most recent several months  have been stable.  However, he was hospitalized in December of 1008 for  chest pain.  At that time he had cardiac catheterization, which showed a  patent left internal mammary graft to the LAD, patent vein graft to a  diagonal, vein graft to an obtuse marginal, vein graft to the right  coronary artery, and PDA diffuse disease distally particularly in the  right coronary.  Normal left ventricular function.  He had a drug-  eluting stent in the circumflex in proximally and a non-drug-eluting  stent distally in May  of 2006.  In 2006 he had distal circumflex in-  stent restenosis and had to have repeated cutting balloon intervention.  He obviously would be optimal if he were on Plavix for a lifetime.   Unfortunately, he had a lot of bleeding as mentioned above.  He is  currently off of aspirin and Plavix.  He is symptom free.   PAST MEDICAL HISTORY:   CURRENT MEDICATIONS:  Ambien 10 mg q.h.s., Protonix 40 mg a day,  allopurinol 100 mg a day, HCTZ 50 mg a day, metoprolol 50 mg a day,  potassium 20 mEq t.i.d., famotidine 20 mg q.h.s., ciprofloxacin 4 mg IV  q. 12 and p.r.n.'s.  He is currently off of Plavix and aspirin as  mentioned above.   In addition to his coronary issues, he has intolerance to statins.  He  has a history of peripheral neuropathy, a history of chronic  hypokalemia, a history of gastroesophageal reflux, ankylosing  spondylitis, multiple kidney stones.  He has also had a left carotid  endarterectomy  in 2002.  He has had a history of prostate CA, status  post prostatectomy in 1996.   SOCIAL HISTORY:  He lives in [place] with his wife.  He is a  Optician, dispensing.  He had adult children.  I met 2 of his grandchildren today.  He does not use any substances.   FAMILY HISTORY:  Positive for coronary disease.   REVIEW OF SYSTEMS:  Other than in the HPI, is negative.  All systems  reviewed.   EXAM:  Today his blood pressure is 104/61, his pulse is 90 and regular.  His temperature is 99.1.  He is afebrile.  Room air sat is 98%.  HEENT:  Normocephalic, atraumatic.  PERRLA.  Extraocular movements  intact.  Sclerae are clear.  He has significant arcus pattern around his  eyes.  Facial symmetry is normal.  NECK:  Supple.  Carotid upstrokes are equal bilaterally.  There is a  carotid endarterectomy site present.  Thyroid is not enlarged.  Trachea  is midline.  LUNGS:  Clear.  There is no rub.  HEART:  Reveals a soft S1, S2 with no rub or gallop.  ABDOMEN:  Soft.  Good bowel sounds.  There  is no tenderness.  He has a  Foley catheter in.  EXTREMITIES:  Reveal no edema.  Pulses are intact.  NEURO:  Exam is intact.  SKIN:  Unremarkable.   LABORATORY DATA:  Shows a potassium of 3.5, hemoglobin 10.4, white count  10.7, otherwise unremarkable.   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   I had a long talk with Caleb Berry and his family.  We are actually  between a rock and a hard place with his Plavix and aspirin.  At this  point in time I think the risk and complications associated with brief  recurrent bleeding from genitourinary standpoint outweigh the small but  real risk of stent thrombosis off of Plavix and/or aspirin.   He understands the reasons for discontinuing this and understands the  risk.   PLAN:  1. Continue to hold Plavix and aspirin.  2. If clinically feasible per urology, restart aspirin 81 mg a day as      soon as possible.  3. Once adequate hemostasis achieved, restart Plavix and aspirin and      follow up with Dr. Bonnee Berry.   Thank you very much for the consultation.      Caleb C. Daleen Squibb, MD, [facility]  Electronically Signed     TCW/MEDQ  D:  03/03/2008  T:  03/03/2008  Job:  [id number]   cc:   Caleb Morton. Riley Kill, MD, [facility]  [address]  [address]  [place]  [place] [address]   Caleb Berry, M.D.  Fax: [phone]   Caleb Berry   Caleb Sinner, MD  Fax: [phone]

## 2011-04-08 NOTE — H&P (Signed)
NAME:  Caleb Berry, Caleb Berry NO.:  [id number]   MEDICAL RECORD NO.:  [id number]           PATIENT TYPE:   LOCATION:                                 FACILITY:   PHYSICIAN:  Dorian Pod, ACNP  DATE OF BIRTH:  20-Jul-1941   DATE OF ADMISSION:  DATE OF DISCHARGE:                              HISTORY & PHYSICAL   PRIMARY CARDIOLOGIST:  Arturo Morton. Riley Kill, MD, [facility]   PRIMARY CARE PHYSICIAN:  Mellissa Kohut.   UROLOGY:  Sigmund I. Patsi Sears, M.D.   Caleb Berry is a very pleasant 70 year old Caucasian gentleman with known  history of coronary artery disease status post bypass in 2000, status  post cardiac catheterization with PCI in May 2006, and repeat cardiac  catheterization with PCI in August 2006.  Caleb Berry  stable since that time, has not really noted any episodes of angina  until this past weekend. He states last Wednesday he noted he felt just  bad all over, weak, and noted that his blood pressure was up to 165/93  by blood pressure cuff at home. Then he began to  have some episodes of  chest tightness.  He states he just felt sluggish. He rates his chest  discomfort at 5 on a scale of 1-10. Did not have any associated nausea  or diaphoresis, but his blood pressure continued to remain elevated. By  Saturday, the chest discomfort had radiated to his bilateral neck area  and down his left arm.  He took one nitroglycerin without relief, tried  to rest, did not seem to help any. Took 2 aspirin.  He states this may  have relieved in mildly. This morning after awakening, chest discomfort  returned. Blood pressure was still elevated.  He took two nitroglycerin  with relief.  He states the chest discomfort has Berry similar to his  bypass and previous interventions.  He decided to come to the emergency  room to get evaluated in the ER he was given some Zofran for some mild  nausea. He is currently pain free   ALLERGIES:  He is intolerant of CODEINE and  ALEVE; has severe muscle  aches and joint pains with STATINS, fibrates.  He is intolerant of  QUESTRAN and [facility] that he knows of.  He states he is pretty much  tried every cholesterol medication but was able to tolerate any of them.  Will confirm this with Dr. Riley Kill.   1. Prilosec q.a.m.  2. Metoprolol 50 b.i.d.  3. Allopurinol 100 daily.  4. Hydrochlorothiazide 50 mg daily.  5. Nitroglycerin p.r.n.  6. Bethanechol 25 mg b.i.d.  7. Plavix 75 daily.  8. Neurontin 50 mg three times a day.  9. Aspirin 81 nightly.  10.Ambien p.r.n.  11.Zantac 150 nightly.  12.Potassium 20 mEq three times a day.  13.Vicodin p.r.n.   PAST MEDICAL HISTORY:  1. Coronary artery disease bypass in 2000. The patient had  a LIMA to      the LAD, saphenous vein graft to the diagonal, saphenous vein graft      to the OM, saphenous  vein graft to the RCA and the PDA. In May      2006, percutaneous intervention of the circumflex. The patient had      a drug-eluting stent placed proximally and a non-drug-eluting stent      placed distally. Under this cardiac catheterization and PCI in      August 2006. The patient with distal circumflex in-stent restenosis      underwent PCI.  There was marked ST-elevation and reproduction of      symptoms with each balloon inflation despite small vessel distally      at that time.  The patient was also the study drug, TRITON.  The      patient was noted to have continued patency of previous proximately      placed stent and previously noted grafts  2. Intolerance to STATINS.  3. Peripheral neuropathy.  The patient states he has previously Berry      worked up for peripheral vascular disease and diabetes and lower      extremities with both tests being negative per patient's report.  4. History of chronic hypokalemia.  5. History of prostate cancer status post prostatectomy in 1996.  6. GERD.  7. Ankylosing spondylitis.  8. Multiple kidney stones  9. Left carotid  enterectomy in 2002.  10.History of proximal urethral strictures status post      cystourethroscopy with balloon urethral dilatation by Dr.      Patsi Sears.  This procedure has Berry done several times.  The      patient routinely in and out catheterizes himself secondary to      ongoing strictures.   SOCIAL HISTORY:  Lives in [place] with his wife.  He is a Optician, dispensing.  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  Denies any EtOH, tobacco, drug or herbal  medication use.   FAMILY HISTORY:  Positive for coronary artery disease on both sides of  family and also in siblings.   REVIEW OF SYSTEMS:  Positive for headaches, chest pain dry cough,  urgency requiring in and out urinary catheterization. Generalized  weakness, lower extremity discomfort, myalgias and pain in lower  extremities requiring p.r.n. narcotic use.   PHYSICAL EXAMINATION:  VITAL SIGNS:  Temperature 98.1,  pulse 59,  respirations 18, blood pressure 146/83 and  146/87. Saturating 100% on 2  liters and 97% on room air.  GENERAL:  In no acute distress.  Currently denies any pain.  HEENT:  Normocephalic, atraumatic.  Pupils equal, round, and reactive to  light.  Sclerae clear.  He has glasses on.  NECK:  Supple without bruits.  No JVD.  Well-healed scar to left neck.  CARDIOVASCULAR:  Exam reveals S1-S2, regular rate and rhythm.  LUNGS:  Clear to auscultation bilaterally.  SKIN:  Warm and dry.  ABDOMEN:  Soft, nontender, positive bowel sounds.  EXTREMITIES:  Lower Extremities without clubbing, cyanosis or edema.  NEUROLOGIC:  Alert and oriented x3.   Chest x-ray shows no acute disease.   EKG:  Sinus bradycardia, rate of 56 without acute ST or T wave changes.   Hemoglobin 14.1, hematocrit 43.2, WBC 5.4, platelets 327,000.  Sodium  135, potassium 4.7, chloride 99, CO2 26, BUN 14, creatinine 1.15,  glucose 113, AST 27, ALT 28.  First set of point-of-care markers  negative.   Dr. [name] in to examine and assess patient who  complains of  chest pain similar to previous interventions. Patient with intolerance  to STATINS and HYPERCHOLESTEREMIA MEDICATIONS.  Currently blood  pressure not  well controlled.  Will follow up with Dr. Riley Kill whether  or not the patient could attempt therapy with Pravachol.  Will also need  to start an ACE inhibitor and decrease the dose on his  hydrochlorothiazide as there is no proven benefit to hydrochlorothiazide  above 25 mg dose.  This may help resolve some of his chronic hypokalemia  also.      Dorian Pod, ACNP     MB/MEDQ  D:  10/26/2007  T:  10/26/2007  Job:  [id number]

## 2011-04-08 NOTE — Op Note (Signed)
NAME:  Caleb Berry, Caleb Berry                ACCOUNT NO.:  [id number]   MEDICAL RECORD NO.:  [id number]          PATIENT TYPE:  AMB   LOCATION:  NESC                         FACILITY:  [facility]   PHYSICIAN:  Sigmund I. Patsi Sears, M.D.DATE OF BIRTH:  10-19-1941   DATE OF PROCEDURE:  12/21/2008  DATE OF DISCHARGE:                               OPERATIVE REPORT   PREOPERATIVE DIAGNOSIS:  Severe penoscrotal junction urethral stricture.   POSTOPERATIVE DIAGNOSIS:  Severe penoscrotal junction urethral  stricture.   OPERATION:  1. Cystourethroscopy.  2. Balloon dilation of urethral stricture.  3. Holmium laser vaporization urethral stricture.  4. Foley catheterization.   SURGEON:  Sigmund I. Patsi Sears, M.D.   ANESTHESIA:  General LMA.   PREPARATION:  After appropriate preanesthesia, the patient is brought to  the operating room, placed on the operating room table in dorsal supine  position where general LMA anesthesia was induced.  He was then replaced  in dorsal lithotomy position where pubis was prepped with Betadine  solution and draped in usual fashion.   REVIEW OF HISTORY:  This 70 year old male has a history of radical  retropubic prostatectomy, and did well postoperative until carotid  endarterectomy at which time the patient had difficult Foley insertion,  with the urethral stricture disease.  He has developed urethral  strictures over the years, and had a mesh UroLume prosthesis placed in  the proximal urethra, which has required transurethral resection in the  past for stricture.  However, at this time, the patient has been on self-  intermittent catheterization and has an induced stricture from self  catheterization which has been balloon dilated in the past.  He is now  in need of another dilation.  Cystoscopy revealed the distal urethral  stricture distal or penoscrotal junction stricture, and will not allow  flexible cystoscope through.   PROCEDURE:  Cystourethroscopy  accomplished and shows dense penoscrotal  junction stricture.  Guidewire was passed through the strictured area,  into the bladder under fluoroscopic control, and the patient undergoes  balloon dilation to 16 atmospheres pressure for 3 minutes.  Following  this, as the scope was then manipulated into the bladder and the bladder  neck appears to be postoperative, but not obstructed.  The patient's  previously placed UroLume prosthesis is also in good position by  radiographic interpretation in the proximal urethra, and it is not  markedly overgrown.  The patient does have a very dense penoscrotal  junction stricture, and I elected to use the holmium e laser to incise  the stricture.  The stricture was incised at the 7 o'clock position, but  did not give way.  It was, therefore, incised at the 5 o'clock position,  and still did not give way.  I, therefore. tried to laser across from 5  to 7 o'clock positions.  Following this, it was felt that more laser  could damage the remaining urethra, and because I  was able to open the urethra somewhat, I elected to stop the procedure  at that point.  An 18-French  catheter was passed into the bladder, will  be left in  the urethra for 1 week to allow healing.  The patient did not  have any bleeding.  He was awakened and taken to the recovery room in  good condition.      Sigmund I. Patsi Sears, M.D.  Electronically Signed     SIT/MEDQ  D:  12/21/2008  T:  12/21/2008  Job:  [id number]

## 2011-04-11 NOTE — Discharge Summary (Signed)
[name], [name] NO.:  [id number]   MEDICAL RECORD NO.:  [id number]          PATIENT TYPE:  INP   LOCATION:  2024                         FACILITY:  [facility]   PHYSICIAN:  Willa Rough, M.D.     DATE OF BIRTH:  11-30-1940   DATE OF ADMISSION:  04/02/2005  DATE OF DISCHARGE:  04/05/2005                                 DISCHARGE SUMMARY   PROCEDURES:  1.  Cardiac catheterization.  2.  Coronary arteriogram.  3.  Left ventriculogram.  4.  PTCA and Cypher stent to the mid circumflex.  5.  PTCA and bare metal stent to the OM-2.   DISCHARGE DIAGNOSES:  1.  Non-ST-segment-elevation myocardial infarction with intervention as      described above, bypass grafts patent and ejection fraction 60%.  2.  Status post aortocoronary bypass surgery in 2000 with left internal      mammary artery to left anterior descending, saphenous vein graft to      diagonal, saphenous vein graft to obtuse marginal, and saphenous vein      graft to right coronary artery and posterior descending artery.  3.  Hypertension.  4.  Hyperlipidemia, intolerant to 'STATINS.  5.  Gastroesophageal reflux disease symptoms.  6.  Status post carotid endarterectomy in 2002.  7.  History of prostate cancer status post radiation.  8.  History of spondylosis, sciatic nerve problems.  9.  Hard of hearing.  10. History of stress incontinence.   HOSPITAL COURSE:  [name] is 70 year old male with known coronary artery  disease.  He was seen in the emergency room for chest pain and discharged  Mar 30, 2005.  He had recurrent chest pain and came to the office.  He was  evaluated there and sent to the emergency room.  He was admitted for further  evaluation and treatment.   His enzymes were elevated, and he had non-ST-segment-elevation MI.  Peak CK-  MB was 226/11.9 with troponin of 1.24.  It was felt cardiac catheterization  was indicated, and this was performed on Apr 02, 2005.   Cardiac catheterization  showed an 80% circumflex and 99% OM.  Circumflex was  treated with a Cypher stent, and the OM was treated with a Multilink Mini  Vision stent.  Those stenoses were reduced to less than 10%.  The LIMA was  atretic but patent, and his vein grafts were patent.  His EF was 60%.   He tolerated the procedure well, and his sheath was removed without  difficulty.   He was seen by cardiac rehab and given MI and stent restrictions and  instructions on use of sublingual nitroglycerin, calling 911, and other  issues.  Additionally, lipid profile was checked and showed a total  cholesterol of 259, triglycerides 335, HDL 37, LDL 155.  He is to follow up  in the office and therapy determined at that time.  In the meantime, he has  been given information on a low-fat, low-cholesterol diet.   The patient was enrolled in the TRITON research trial and is, in a blinded  study, receiving either  Plavix or __________.  He is followed by research  nurse supplying this medication.   As part of his evaluation, he had a chest x-ray which showed no acute  disease.  By Apr 05, 2005, he was ambulating without chest pain or shortness  of breath.  His other labs were within normal limits, and his cardiac  enzymes were mildly abnormal but trending downward.  He was considered  stable for discharge and is to follow up as an outpatient with Dr. Riley Kill  and with Dr. Laurann Montana.   Of note, the patient complains of sciatic nerve pain in his back, and in  hospital he was treated with Vicodin for this.  He is to follow up with his  orthopedist after discharge.      RB/MEDQ  D:  04/05/2005  T:  04/06/2005  Job:  [id number]   cc:   Stacie Acres. White, M.D.  [address]  [place]  [place] [address]  Fax: [phone]   Garron Eline. Riley Kill, M.D. [facility]   Viviann Spare R. Ranell Patrick, M.D.  [facility]  [address]  [address]  [place]  [place] [address]  Fax: [phone]

## 2011-04-11 NOTE — Discharge Summary (Signed)
   NAME:  Caleb Berry, Caleb Berry NO.:  [id number]   MEDICAL RECORD NO.:  [id number]                   PATIENT TYPE:  INP   LOCATION:  3728                                 FACILITY:  [facility]   PHYSICIAN:  Veneda Melter, M.D. [facility]               DATE OF BIRTH:  [birth year]   DATE OF ADMISSION:  08/11/2002  DATE OF DISCHARGE:  08/15/2002                           DISCHARGE SUMMARY - REFERRING   DISCHARGE DIAGNOSES:  1. Coronary artery disease.  2. Hyperlipidemia.  3. History of bladder cancer and recent urologic procedures (dilatation of     ureter) by Dr. Lynelle Smoke I. Tannenbaum.   HOSPITAL COURSE:  The patient was admitted on August 11, 2002 with  unstable angina from the office.  Lab studies did reveal hypokalemia and  this was replenished.  Cardiac isoenzymes were negative.  His hemoglobin was  12.1, hematocrit 36.3, platelets 269,000, white count 4.9.  Sodium 140,  potassium 3.8, BUN 14, creatinine 1.2.   His EKG revealed sinus bradycardia with a rate of 53 with no acute ST-T wave  changes.   He does have a history of coronary artery disease noted in July of 2000 and  underwent bypass surgery with the following grafts:  LIMA to the LAD,  saphenous vein graft to the diagonal, saphenous vein graft to the OM-1 and  sequential saphenous vein graft to the RCA and PLA.  Eventually, he  underwent cardiac catheterization on August 15, 2002 and was found to  have stable coronary artery disease as follows:  Left main with mild  disease, LAD with a 70% proximal in-stent restenosis, left circumflex with a  30% stenosis at the A-V groove and totalled OM-1, RCA dominant with a 95%  mid, followed by a 70% distal lesion.  The LIMA to the LAD was patent,  saphenous vein graft to D-2 patent, saphenous vein graft to OM-1 patent and  saphenous vein graft to the PDA was patent.  Dr. Arturo Morton. Stuckey felt the  patient should be managed medically and arrangements were made for  him to be  discharged to home after catheterization.   FOLLOWUP:  He will need to be followed up with Dr. Stacie Acres. White in one  to two weeks and follow up with Dr. Riley Kill on a six-month rotation.     Guy Franco, P.A. [facility]                      Veneda Melter, M.D. [facility]    LB/MEDQ  D:  08/15/2002  T:  08/17/2002  Job:  [id number]   cc:   [facility], [address] [place], [place]  [address] [name]., M.D.   Arturo Morton. Riley Kill, M.D. [facility]

## 2011-04-11 NOTE — Op Note (Addendum)
NAME:  Caleb Berry, Caleb Berry                          ACCOUNT NO.:  [id number]   MEDICAL RECORD NO.:  [id number]                   PATIENT TYPE:  AMB   LOCATION:  DAY                                  [facility]ACILITY[facility]  [facility]   PHYSICIAN:  Sigmund I. Patsi Sears, M.D.         DATE OF BIRTH:  February 24, 1941   DATE OF PROCEDURE:  03/27/2003  DATE OF DISCHARGE:                                 OPERATIVE REPORT   PREOPERATIVE DIAGNOSIS:  Urethral stricture disease.                                                               POSTOPERATIVE DIAGNOSIS:  Urethral stricture disease.                                                               OPERATION:  Bugbee electrode dissection of bladder neck contracture.   SURGEON:  Sigmund I. Patsi Sears, M.D.   ANESTHESIA:  General (LMA).   PREPARATION:  After appropriate preanesthesia, the patient is brought to the  operating room and placed on the operating table in the dorsal supine  position where general LMA anesthesia was introduced.  He was then re-placed  in the dorsal lithotomy position, where the pubis was prepped with Betadine  solution and draped in the usual fashion.   HISTORY:  The patient's history is that of patient with adenocarcinoma of  the prostate, in June 1996, with bladder neck contracture and urethral  stricture disease, status post Urolume prosthesis.  The patient's had  bladder neck contracture release once before.  He self-dilates with a 14  French red Robinson catheter but is currently finding it very difficult to  self-cath.   DESCRIPTION OF PROCEDURE:  Cystoscopy was accomplished with a 22 French  cystoscope.  It shows the Urolume in place with excellent overgrowth.  There  is bladder neck contraction, and this is released with the Bugbee electrode.  Overgrowth of the stent is also electrocoagulated with the Bugbee electrode.  A 14 French Foley catheter is left to straight drainage at the end of the  procedure.  B&O suppository is  placed, and the patient is taken to the  recovery room in excellent condition.                                               Sigmund I. Patsi Sears, M.D.    SIT/MEDQ  D:  03/27/2003  T:  03/27/2003  Job:  [id number]

## 2011-04-11 NOTE — Op Note (Signed)
[facility]  Patient:    Caleb Berry, Caleb Berry Visit Number: [id number] MRN: [id number]          Service Type: DSU Location: DAY Attending Physician:  Laqueta Jean Dictated by:   Vonzell Schlatter Patsi Sears, M.D. Admit Date:  05/23/2002 Discharge Date: 05/23/2002                             Operative Report  PREOPERATIVE DIAGNOSIS:  Painful Urolume posterior urethral prosthesis.  POSTOPERATIVE DIAGNOSIS:  Painful Urolume posterior urethral prosthesis.  PROCEDURES: 1. Cystourethroscopy. 2. Cauterization of hyperplastic polyps within Urolume prosthesis. 3. Urethral dilation.  SURGEON:  Sigmund I. Patsi Sears, M.D.  ANESTHESIA:  General (LMA).  PREPARATION:  After appropriate preanesthesia, the patient was brought to the operating room and placed on the operating table in the dorsal supine position, where general (LMA) anesthesia was induced.  He was then placed in the dorsal lithotomy position, where the pubis was prepped with Betadine solution and draped in the usual fashion.  HISTORY:  The patient is a 70 year old male, who is status post implantation of Urolume stricture posterior prosthesis on January 27, 2002, for posterior urethral stricture disease, acquired after Foley catheterization during carotid endarterectomy procedure.  The patient is status post radical prostatectomy in the past, with good urinary continence postoperatively.  The patient was seen one week ago, with some relief of his pain.  It was felt to go ahead and plan cystoscopy to evaluate the Urolume prosthesis.  DESCRIPTION OF PROCEDURE:  Cystoscopy was accomplished with a 17 scope, again with a 22 scope.  Some hyperplastic nodules were identified within the Urolume prosthesis, and these were cauterized with the 24 scope and the electrocautery.  The bladder neck appeared to be slightly tight, and this was easily dilated just by placing the scope through the urethra into the  bladder, draining the fluid from the bladder.  Minimal bleeding was noted.  The Urolume was noted to be in excellent position.  It was evaluated by the company representative as well.  I discussed the situation with the patients wife.  I am going to leave the Urolume in position because it is an excellent position and looks very good. I believe it needs some more time for healing, for the urethra to overgrow. Therefore, the bladder was drained of fluids and the patient was awakened and taken to recovery in good condition.Dictated by:   Vonzell Schlatter Patsi Sears, M.D.  Attending Physician:  Laqueta Jean DD:  05/23/02 TD:  05/25/02 Job: [id number]

## 2011-04-11 NOTE — Consult Note (Signed)
NAME:  Caleb Berry, Caleb Berry                ACCOUNT NO.:  [id number]   MEDICAL RECORD NO.:  [id number]          PATIENT TYPE:  EMS   LOCATION:  MAJO                         FACILITY:  [facility]   PHYSICIAN:  Anna Genre. Maisie Fus, M.D. [facility] OF BIRTH:  July 05, 1941   DATE OF CONSULTATION:  03/30/2005  DATE OF DISCHARGE:                                   CONSULTATION   CHIEF COMPLAINT:  Atypical chest pain.   HISTORY OF PRESENT ILLNESS:  Caleb Berry is a 70 year old gentleman with a  history of known coronary artery disease, status post CABG in 2000. The  patient's last catheterization was done in 2003 which showed patent grafts  with LIMA to the LAD, saphenous vein graft to PDA, saphenous vein graft to  diagonal, and saphenous vein graft to marginal all of which were patent. His  last functional study was March 2005 in which he exercised to [facility] protocol  stage IV, had no chest pain. The test was concluded secondary to dyspnea and  fatigue and showed no evidence of ischemia. Caleb Berry is a very active  gentleman who plays golf several times a week. He also is able to mow the  grass without difficulty. This morning, he woke up with pain his left arm.  He went to church and subsequently went to lunch and during lunch noticed  that he had worsening pain in his left arm that radiated into his chest. The  pain was not associated with shortness of breath, palpitations, nausea,  vomiting, or syncopal-like symptoms. That at that time went to a local  rescue squad, had his blood pressure taken, and it was noted to be elevated  in the 190/80s. Of note, the patient took his metoprolol this morning but  did not take his hydrochlorothiazide.   He subsequently presented to the emergency room at [facility]. The patient also notes that approximately one month ago he had a  similar episode, although not as severe with essentially pain in his arm.  Additionally, the patient did some work cutting trees  in his yard several  days ago and also played golf yesterday which he thinks may also have  contributed to some of his pain.   PAST MEDICAL HISTORY:  1.  Coronary artery disease, status post CABG in 2000.  2.  History of peripheral vascular disease, status post carotid      endarterectomy.  3.  Hypercholesterolemia.  4.  Hypokalemia.  5.  Nephrolithiasis on Allopurinol.  6.  History of prostate cancer.  7.  GERD.  8.  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   MEDICATIONS:  1.  Hydrochlorothiazide 50 mg daily.  2.  Lopressor 50 mg b.i.d.  3.  Potassium unclear how many mEq he is taking a day.  4.  Allopurinol, unclear of the dosage.  5.  Prilosec p.r.n.  6.  Aspirin daily.  7.  Darvon p.r.n.   ALLERGIES:  The patient has been intolerant to multiple CHOLESTEROL LOWER  MEDICATIONS including MULTIPLE STATINS, FIBRATES, NIACIN, and QUESTRAN. He  states that these medications exacerbated his arthritis.  REVIEW OF SYMPTOMS:  The patient's review of systems are only positive for  his cardiac symptoms as reported in his HPI.   SOCIAL HISTORY:  The patient is a Optician, dispensing. Does not smoke, use alcohol, or  illicit drugs.   PHYSICAL EXAMINATION:  VITAL SIGNS:  Blood pressure is 152/90, pulse is 60,  respiratory rate is 16. Saturating 98% on room air. Afebrile.  HEENT:  Within normal limits.  NECK:  His JVP is not elevated.  LUNGS:  Clear to auscultation bilaterally.  CARDIOVASCULAR:  Normal S1 and S2. There is a 1/6 systolic ejection murmur.  His PMI is nondisplaced. He has no rubs or gallops.  CHEST:  He has a well-healed left carotid endarterectomy scar. His carotid  pulsations are 2+ and symmetric and there are no audible bruits.  EXTREMITIES:  No edema.  NEUROLOGICAL:  He has no focal deficits and his motor and sensory functions  appear to be intact.   LABORATORY DATA:  His EKG shows a normal sinus rhythm with a normal axis. He  has normal intervals and  there is no evidence of ST or T-wave changes. It is  unchanged from his prior EKG.   His Chem-7 and CBC are within normal limits. His cardiac markers are  negative.   IMPRESSION:  This is a 70 year old gentleman with a history of coronary  artery disease, status post coronary artery bypass graft who presents today  with atypical chest pain. After having a long discussion with the patient, I  do not feel that his symptoms are cardiac in nature and may be secondary to  the significant exertion he has had over the last several days.   PLAN:  1.  Coronary artery disease:  The patient is on a good medical regimen with      the exception of a Statin which he has been intolerant of. He takes a      beta blocker and also an aspirin daily. We will schedule him an      appointment to see Dr. Arturo Morton. Stuckey in the next couple of weeks to      be reevaluated at that time. I instructed the patient that if he has any      additional chest pain to return to the emergency department and we will      pursue a more aggressive cardiac workup at that time. At the time when      Dr. Arturo Morton. Riley Kill sees him, he can assess the utility of performing      a functional study, although I do not think it is warranted at this      time.  2.  Hypertension:  The patient is on hydrochlorothiazide and metoprolol      twice a day. His pulse is in the 60s and we are unable to titrate his      medication at this time as far as the metoprolol is concerned. I      indicated he had not taken his hydrochlorothiazide today. I expressed to      him the importance of taking that as a blood pressure lowering agent. He      thought it was only for prevention of his kidney stones. We need to      monitor his blood pressure as an outpatient and titrate his medications      for optimal control.  3.  Hypercholesterolemia: This is a significant modifiable risk factors,     although he has  been intolerant of many medications.  Dr. Arturo Morton.      Riley Kill will address this in follow-up and the patient may be a      candidate for a trial of Zetia.      KLT/MEDQ  D:  03/30/2005  T:  03/31/2005  Job:  [id number]   cc:   Stacie Acres. White, M.D.  [address]  [place]  [place] [address]  Fax: [phone]   Stevin Bielinski. Riley Kill, M.D. [facility]

## 2011-04-11 NOTE — Cardiovascular Report (Signed)
NAME:  Caleb Berry, Caleb Berry                ACCOUNT NO.:  [id number]   MEDICAL RECORD NO.:  [id number]          PATIENT TYPE:  OIB   LOCATION:  6523                         FACILITY:  [facility]   PHYSICIAN:  Arturo Morton. Riley Kill, M.D. 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 He has previously undergone revascularizations surgery in 2000  with an internal mammary to the LAD, saphenous vein graft to the diagonal,  saphenous vein graft to the OM, sequential saphenous vein graft to the  distal RCA and PDA. The patient underwent percutaneous intervention of the  circumflex in May of this year. At that time, he had a drug-eluting stent  placed proximally and a nondrug-eluting stent placed distally in an area  that was too small for the existing drug-eluting stents. On Saturday night,  he awoke with substernal chest pain that was similar to what he experienced  with balloon dilatation previously. Because of this, I discussed the various  options with him and elected to bring him in for repeat catheterization and  possible intervention.   PROCEDURES:  1.  Selective coronary arteriography.  2.  Saphenous vein graft angiography x3.  3.  Subclavian angiography.  4.  Percutaneous intervention of the distal circumflex for in-stent      restenosis.   DESCRIPTION OF PROCEDURE:  The patient was brought to the catheterization  laboratory after informed consent, prepped and draped in the usual fashion.  Through an anterior puncture, the right femoral artery was easily entered. A  4-French sheath was placed. We used 4-French diagnostic catheters to engage  the left main, the right, and also the vein grafts. The subclavian was  injected but we did not directly inject the internal mammary because of the  fact that this had been done  in May and was patent and atretic from flow  through the diagonal graft into the distal LAD. There was clear evidence of  in-stent restenosis distally in the previously placed nondrug-eluting 2 mm x  15 Mini-Vision stent. We discussed the various options and it was my feeling  that we should go ahead and try to reopen this area. The patient was  agreeable. With this, the patient was given bivalirudin with appropriate ACT  and a 4-French sheath upgraded to a 6-French sheath. A JL-3.5 Judkins guide  was used to engage the left main. High-torque floppy was taken down  distally. We initially tried to pass a cutting balloon into the vessel but  it would not cross. This was removed and replaced with a 2.25 x 8 Quantum  Maverick balloon which is taken up to about 8 atmospheres with fairly marked  improvement. There was marked ST elevation and identical reproduction of the  patient's symptoms. Following this, the 2.0 x 10 cutting balloon was then  placed across the area of the previously placed stent and taken up to about  6 to 7 atmospheres. There was fairly marked improvement  throughout this  whole area. We then placed a 2.5 x 12 Voyager in the previously placed stent  and this was taken up to about 5 atmospheres. Marked improvement in the  appearance of the artery was noted. We discussed the possibility of placing  a drug-eluting stent, but given the vessel size, it was felt that this  should be avoided at least at present.   The patient tolerated the procedure well, but notably there was marked ST  elevation and reproduction of symptoms with each balloon inflation despite  the small vessel distally. There were no complications. Femoral sheath was  sewn into place and all catheters were removed. He was taken to the holding  area in satisfactory clinical condition. He was given his study drug for the  Triton study in the laboratory itself.   ANGIOGRAPHIC DATA:  1.  The left main was free of  critical disease.  2.  The left anterior descending artery demonstrates a long area of probable      in-stent renarrowing. There is competitive flow distally.  3.  The internal mammary shows flow and there is retrograde filling through      the LAD that's visualized during the diagonal graft injection and this      is compatible with a widely patent internal mammary  4.  The vein graft to the diagonal is widely patent and fills the diagonal      retrograde and into the LAD where the above-findings were noted.  5.  The circumflex is a large-caliber vessel. The first marginal was      occluded. The distal marginal is supplied by a vein graft that is widely      patent. Beyond this is an area of previously stented vessel with a drug-      eluting 2.5 mm stent appears widely patent. Distally was a 2.0 x 15 Mini-      Vision that demonstrates a fair amount of in-stent restenosis,      overlapping of smaller branch with 90% narrowing. Distally there are      multiple small subbranches that come off beyond the stent location. This      is reduced to less than 30% residual luminal narrowing.  6.  The right coronary artery is severely diseased proximally. It is      subtotally occluded in the mid vessel with competitive filling distally.  7.  The vein graft to the distal right PDA are widely patent.  8.  No ventriculogram was done to reduce contrast load given the patient's      creatinine of 1.4 and multiple prior kidney stones.   CONCLUSION:  1.  Successful percutaneous repeat balloon dilatation of the in-stent      restenosis of the distally placed 2.0 x 15 Mini-Vision on the previous      study of Apr 02, 2005.  2.  Continued patency of the proximally placed stent.  3.  Continued patency of the previously noted grafts.   DISPOSITION:  The patient will be treated medically. We will likely add an ACE inhibitor. He has had a very difficult time with all the cholesterol  lowering medicines, not  only the Statins but also nonstatin cholesterol-  lowering agents. Otherwise aggressive attention to his medical situation  will be again recommended.      Arturo Morton. Riley Kill, M.D. [facility]  Electronically Signed     TDS/MEDQ  D:  07/23/2005  T:  07/23/2005  Job:  [id number]  cc:   CV Laboratory   Patient's medical record

## 2011-04-11 NOTE — Op Note (Signed)
NAME:  Caleb Berry, Caleb Berry                ACCOUNT NO.:  [id number]   MEDICAL RECORD NO.:  [id number]          PATIENT TYPE:  AMB   LOCATION:  DAY                          FACILITY:  [facility]   PHYSICIAN:  Sigmund I. Patsi Sears, M.D.DATE OF BIRTH:  Nov 03, 1941   DATE OF PROCEDURE:  12/01/2005  DATE OF DISCHARGE:                                 OPERATIVE REPORT   PREOP DIAGNOSIS:  Proximal urethral stricture.   POSTOPERATIVE DIAGNOSIS:  Proximal urethral stricture.   OPERATION:  Cystourethroscopy, Cook balloon urethral dilation (5 minutes).   SURGEON:  Sigmund I. Patsi Sears, M.D.   ANESTHESIA:  General LMA.   PREPARATION:  After appropriate preanesthesia, the patient was brought to  the operating room and placed on the operating room in the dorsal supine  position where general LMA anesthesia was introduced. He was then replaced  in dorsal lithotomy position where the pubis was prepped with Betadine  solution and draped in the usual fashion.   REVIEW OF HISTORY:  This 70 year old male is status post radical  prostatectomy for adenocarcinoma of the prostate. He has an anastomotic  stricture for dilation today. He has a history of a Foley catheter balloon  trauma, which has been treated, in the past, with dilation; and with the  same this year old stent at the penoscrotal junction.   DESCRIPTION OF PROCEDURE:  Cystourethroscopy under direct vision was  accomplished, and showed the patient's proximal ureteral stent. The scope  can not be passed proximal to the stent. It is easily passed through the  previous placed stent, however , which is well epithelialized.   The guidewire was passed through the scope into the bladder, and then a Cook  transurethral balloon dilator is passed into the bladder. The radiographic  markers are identified. Half-saline, half-contrast is then injected into the  balloon  to 14 atmospheres pressure. This is held for 5 minutes. The balloon is then  emptied, and  the catheter removed. Repeat cystoscopy would not admit the 22  scope, but easily admitted a 16 Council catheter over guidewire. There was  no bleeding noted. The patient was then awakened and taken to the recovery  room in good condition.      Sigmund I. Patsi Sears, M.D.  Electronically Signed     SIT/MEDQ  D:  12/01/2005  T:  12/01/2005  Job:  [id number]   cc:   Arturo Morton. Riley Kill, M.D. [facility]  [address]  [address]  [place]  [place] [address]

## 2011-04-11 NOTE — Discharge Summary (Signed)
NAME:  Caleb Berry, Caleb Berry                ACCOUNT NO.:  [id number]   MEDICAL RECORD NO.:  [id number]          PATIENT TYPE:  OIB   LOCATION:  6523                         FACILITY:  [facility]   PHYSICIAN:  Arturo Morton. Riley Kill, M.D. [facility] OF BIRTH:  05-06-1941   DATE OF ADMISSION:  07/23/2005  DATE OF DISCHARGE:  07/24/2005                                 DISCHARGE SUMMARY   PROCEDURES:  1.  Cardiac catheterization.  2.  Coronary arteriogram.  3.  LIMA arteriogram.  4.  Saphenous vein angiogram.  5.  Percutaneous intervention x1.   DISCHARGE DIAGNOSES:  1.  Chest pain, status post percutaneous intervention to circumflex for in-      stent restenosis.  2.  Status post aortocoronary bypass surgery with left internal mammary      artery to left anterior descending, saphenous vein graft to diagonal,      saphenous vein graft to obtuse marginal and saphenous vein graft to      distal right coronary artery and posterior descending artery.  3.  Hyperlipidemia, intolerance to multiple cholesterol-lowering drugs.  4.  Hyperglycemia after receiving D5W containing IV fluids overnight with a      nonfasting CBG of 108 and fasting  CBG 142.  5.  Headache felt secondary to a fentanyl.  6.  History of peripheral vascular disease with prior carotid      endarterectomy.  7.  History of hypokalemia.  8.  Nephrolithiasis.  9.  History of prostate cancer.  10. History of reflux symptoms.  11. 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  12. Allergy or intolerance to multiple cholesterol lowering medications,      codeine, Aleve and now fentanyl.   HOSPITAL COURSE:  Caleb Berry is a 70 year old male well known to Dr. Riley Kill.  He had percutaneous intervention to the proximal/mid circumflex by drug-  eluting stent in the distal circumflex with a nondrug-eluting stent. He had  substernal chest pain and was admitted for further evaluation and  catheterization.   Catheterization showed 90% in-stent  restenosis in the distal nondrug eluting  circumflex stent. This was treated with percutaneous intervention reducing  the stenosis to less than 30%. Caleb Berry had been on the TRITON study drug  prior to admission. This was continued.   The day after the procedure, he was ambulating without chest pain or  shortness of breath. He had had a headache after an injection of fentanyl  for back pain which he has secondary to sciatica. This finally resolved with  Tylenol. His blood sugar was elevated at 142 but he had received D5  containing IV fluids overnight. Caleb Berry was stabilized and discharged on  July 24, 2005, with outpatient follow-up arranged.   DISCHARGE INSTRUCTIONS:  His activity level is to be per the discharge cath  discharge instruction sheet. He is to call our office for any problems with  the cath site. He is to stick to a low-fat diet. He is not to drive for two  days and not to lift for two  weeks. He is to follow up with Dr. Rosalyn Charters  PA  on August 04, 2005, at 1:30 with Dr. Cliffton Asters as needed.   DISCHARGE MEDICATIONS:  1.  TRITON medication daily.  2.  Nitroglycerin sublingual p.r.n.  3.  Allopurinol 100 mg daily.  4.  Hydrochlorothiazide 50 mg daily.  5.  Lopressor 50 mg b.i.d.  6.  Prilosec 20 mg daily in a.m. and Zantac 75 mg nightly.  7.  K-Dur 20 mEq b.i.d.  8.  Isosorbide 30 mg daily.  9.  Aspirin 325 mg daily.  10. Zantac 75 mg nightly.  11. Vicodin, vitamin B6, Ambien and Bactrim as prior to admission.      Theodore Demark, P.A. [facility]      Strummer D. Riley Kill, M.D. [facility]  Electronically Signed    RB/MEDQ  D:  07/24/2005  T:  07/24/2005  Job:  [id number]   cc:   Stacie Acres. Cliffton Asters, M.D.  Fax: [phone]

## 2011-04-11 NOTE — Op Note (Signed)
NAME:  Caleb Berry, Caleb Berry                ACCOUNT NO.:  [id number]   MEDICAL RECORD NO.:  [id number]          PATIENT TYPE:  AMB   LOCATION:  [facility]                         [facility]ACILITY:  [facility]   PHYSICIAN:  Sigmund I. Patsi Sears, M.D.DATE OF BIRTH:  Jan 16, 1941   DATE OF PROCEDURE:  08/24/2006  DATE OF DISCHARGE:                                 OPERATIVE REPORT   PREOPERATIVE DIAGNOSIS:  Urethral stricture disease.   POSTOPERATIVE DIAGNOSIS:  Urethral stricture disease.   OPERATION:  Cystourethroscopy, Cook balloon dilation of proximal urethral  stricture, handheld urethral dilation with a 26-French.   SURGEON:  Sigmund I. Patsi Sears, M.D.   ANESTHESIA:  General LMA.   PREPARATION:  After appropriate preanesthesia, the patient was brought to  operating room and placed upon the operating room in the dorsal supine  position, where general LMA anesthesia was introduced.  He was then replaced  in the dorsal lithotomy position, where the pubis was prepped with Betadine  solution and draped in the usual fashion.   REVIEW OF HISTORY:  This 70 year old male has A history of radical  prostatectomy, with the history of urethral stricture disease.  Following  traumatic Foley insertion during carotid endarterectomy, the patient had  recurrent stricture disease, which has required intermittent dilation.   DESCRIPTION OF PROCEDURE:  Cystoscopy was accomplished with the 21  cystoscope, and shows a proximal urethral stricture.  X-ray shows that the  stricture appears proximal to a previously placed transurethral stent.   A guidewire was passed through the strictured area into the bladder and  coiled in the bladder.  A Cook balloon dilator was then passed into the  bladder, and fluoroscopic control, balloon dilation was accomplished for 5  minutes.  There was definite waisting, proximal to the previously placed AMS  stent, and this appeared in the region of the sphincter.  Following 5  minutes of balloon  dilation, repeat cystoscopy revealed that there was  stricture the left, although much less.  Fluoroscopic evaluation showed that  the stricture definitely had dilated approximately 90%.  A 24- and 26- cm  dilator were then passed through the urethra into the bladder, and this  ended the complete dilation of the strictured area.  Cystoscopy again showed  no evidence of bleeding, but there was some old scar tissue free-floating in  the urethra, which was cleared out as best as possible.  There was no stone  or tumor within the bladder.  The bladder was drained of fluid and the  patient awakened and taken to the recovery room in good condition.     Sigmund I. Patsi Sears, M.D.  Electronically Signed    SIT/MEDQ  D:  08/24/2006  T:  08/25/2006  Job:  [id number]

## 2011-04-11 NOTE — Discharge Summary (Signed)
[facility]  Patient:    Caleb Berry, Caleb Berry Visit Number: [id number] MRN: [id number]          Service Type: SUR Location: [id number] Attending Physician:  Caleb Berry Dictated by:   Caleb Berry, P.A. Admit Date:  07/22/2001 Disc. Date: 07/23/01   CC:         Caleb Berry, M.D. [facility]   Discharge Summary  DATE OF BIRTH:  11/01/1941  PRIMARY CARE PHYSICIAN:  Caleb Berry, M.D.  ADMISSION DIAGNOSIS:  Progressive left internal carotid artery stenosis, asymptomatic.  DISCHARGE DIAGNOSIS:  Progressive left internal carotid artery stenosis, asymptomatic.  PREVIOUS MEDICAL HISTORY: 1. Known carotid artery disease followed with ultrasound. 2. Coronary artery disease with coronary artery bypass grafting in the year    2000 by Caleb Berry, M.D. 3. Hypertension. 4. Hypercholesterolemia. 5. 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 6. Prostate cancer with previous x-ray therapy. 7. Ankylosing spondylitis. 8. Nephrolithiasis.  PROCEDURES:  Left carotid endarterectomy with Dacron patch angioplasty on July 22, 2001, by Caleb Berry, M.D.  BRIEF HISTORY:  The patient is a 70 year old white male with a known history of carotid artery disease followed with ultrasound on a regular basis. Recently on July 12, 2001, an ultrasound showed progression of the disease, now severe in nature.  Although [name] has had no symptoms with regard to this, P. Bud Face, M.D., has recommended proceeding with carotid endarterectomy secondary to the severity of the disease.  The risks, benefits, details, and alternatives were discussed and it was agreed to proceed.  HOSPITAL COURSE:  He came into the hospital for the elective procedure on July 22, 2001.  There were no complications.  He was taken to PACU in stable condition.  Postoperatively, he did very well with routine care and there were no complications.  On  postoperative day #1, he was neurologically intact and eating breakfast.  The neck wound was healing well with no swelling or bleeding.  The vital signs were stable.  He was afebrile.  His potassium is 2.9 and it is replaced.  He is given a prescription for potassium to take home.  He is doing well overall and is suitable for discharge home and is subsequently discharged.  DISCHARGE MEDICATIONS:  He will resume his home medicines.  These include Lopressor, allopurinol, HCTZ, enteric-coated aspirin, nitroglycerin p.r.n., and Pepcid.  In addition, he will have Percocet one to two every four to six hours p.r.n. for pain.  ALLERGIES:  CODEINE.  CONDITION ON DISCHARGE:  Stable.  ACTIVITY:  He is told to do no driving, no working, and no strenuous activity or heavy lifting.  WOUND CARE:  He is told that he can shower.  To clean his wounds gently daily with soap and water and to be alert for increasing redness, swelling, drainage, or fever.  To call the office if he has any problems.  FOLLOW-UP:  Caleb Berry, M.D., around three weeks.  The office will call with the appointment. Dictated by:   Caleb Berry, P.A. Attending Physician:  Caleb Berry DD:  07/23/01 TD:  07/23/01 Job: [id number]

## 2011-04-11 NOTE — H&P (Signed)
[facility]  Patient:    Caleb Berry, Caleb Berry Visit Number: [id number] MRN: [id number]          Service Type: Attending:  Denman George, M.D. Dictated by:   Marlowe Kays, P.A. Adm. Date:  07/22/01   CC:         Kerry Kass, M.D. [facility]   History and Physical  DATE OF BIRTH:  [birth year]  CHIEF COMPLAINT:  Left carotid artery disease.  HISTORY OF PRESENT ILLNESS:  A 70 year old white male with known history of carotid artery disease followed up on ultrasound on a regular basis.  The last Doppler on July 12, 2001 revealed progression of the disease, now severe in nature with an end-diastolic velocity of 100 cm/second.  Dr. Madilyn Fireman recommends now to proceed with left CEA in order to reduce the risk of stroke.  No headache, nausea, vomiting, or vertigo.  There is some dizziness experienced by the patient when standing.  No history of falls, seizures, numbness, or tingling.  No muscle weakness, speech impairment, dysphagia, or vision changes.  No syncope or presyncope.  No memory loss or confusion.  PAST MEDICAL HISTORY:  Carotid artery disease, CAD, hypertension, hypercholesterolemia, nephrolithiasis, GERD symptoms with a history of a stricture, history of prostate cancer requiring XRTs, ankylosing spondylitis, history of ______ deficiency, decreased hearing.  PAST SURGICAL HISTORY:  Status post cardiac catheterization June 04, 1999 by Dr. Riley Kill.  He also had other cardiac catheterization in 1987 and 1997. Status post CABG in July 2000 by Dr. Laneta Simmers.  Status post prostate surgery in 1996-radical prostatectomy.  Status post kidney stone removal in 1960.  Status post left ear drum surgery.  MEDICATIONS:  Lopressor, allopurinol, HCTZ, ECASA, KCL, vitamin E, nitroglycerin p.r.n.  Unfortunately, the patient has not brought the medication list nor the medications.  He also cannot remember the amount.  He was instructed to bring the medications  on admission.  ALLERGIES:  CODEINE.  Sensitive to ZOCOR, LIPITOR, QUESTRAN which causes arthralgias.  REVIEW OF SYSTEMS:  See HPI and past medical history for significant positives.  No diabetes or asthma.  FAMILY HISTORY:  Mother alive with heart disease.  Father died of massive MI. He also had a history of CABG.  SOCIAL HISTORY:  Married.  Two children.  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  He quit in 1970 the use of tobacco.  He denies any alcohol intake.  PHYSICAL EXAMINATION  GENERAL:  Well-developed, well-nourished 70 year old white male in no acute distress.  Alert and oriented x 3.  VITAL SIGNS:  Blood pressure 120/64, pulse 68, respirations 18.  HEENT:  Normocephalic, atraumatic.  PERRLA.  EOMI.  Funduscopic examination within normal limits.  NECK:  Supple.  No JVD.  Left very soft bruit audible.  No lymphadenopathy.  CHEST:  Symmetrical on inspirations.  Lungs clear to auscultation.  CARDIOVASCULAR:  Regular rate and rhythm.  No murmurs, rubs, or gallops.  ABDOMEN:  Soft, nontender.  Bowel sounds x 4.  No masses or bruits.  GENITOURINARY:  Deferred.  RECTAL:  Deferred.  EXTREMITIES:  No cyanosis, clubbing, or edema.  No ulcerations.  Temperature warm.  Peripheral pulses:  Carotid, femoral, popliteal, dorsalis pedis, and posterior tibialis 2+ bilaterally.  NEUROLOGIC:  Nonfocal.  Gait steady.  DTRs 2+ bilaterally.  Muscle strength 5/5.  ASSESSMENT AND PLAN:  Left internal carotid artery stenosis for left carotid endarterectomy on July 22, 2001.  Dr. Madilyn Fireman has seen and evaluated the patient prior to  the admission and has explained the risks and benefits involving the procedure and the patient has agreed to continue. Dictated by:   Marlowe Kays, P.A. Attending:  Denman George, M.D. DD:  07/21/01 TD:  07/21/01 Job: [id number]

## 2011-04-11 NOTE — H&P (Signed)
NAME:  Caleb Berry, Caleb Berry NO.:  [id number]   MEDICAL RECORD NO.:  [id number]                   PATIENT TYPE:   LOCATION:                                       FACILITY:  [facility]   PHYSICIAN:  Willa Rough, M.D.                  DATE OF BIRTH:  [birth year]   DATE OF ADMISSION:  DATE OF DISCHARGE:                                HISTORY & PHYSICAL   Mr. Prinston Kynard has known coronary disease.  He is followed by Dr. Riley Kill.  He had been doing well.  In the past few days he has had chest pressure.  This has persisted.  He has not taken nitroglycerin.  However, currently  today when he gets up and tries to get around, he has discomfort in his  chest with question of some radiation to his right arm.  He did undergo CABG  in the past.  He also has a history of carotid endarterectomy and  hypercholesterolemia.   ALLERGIES:  Question of an allergy to CODEINE.   MEDICATIONS:  1. Lopressor 50 mg daily.  2. Hydrochlorothiazide 50 mg daily.  3. Potassium. Recently, his potassium had been low.  4. The patient is also on Allopurinol.  5. The patient takes Prilosec and Darvocet p.r.n.   PAST MEDICAL HISTORY:  For other medical problems, see the complete list  below.   SOCIAL HISTORY:  The patient does work as a Programmer, multimedia in the area.   FAMILY HISTORY:  Positive for coronary disease.   REVIEW OF SYMPTOMS:  The patient feels as if he is aching all over.  He has  not had any significant nausea or vomiting.  He has had no significant cough  and there has been no significant fever.  See the HPI for the remainder of  his review of systems.   PHYSICAL EXAMINATION:  GENERAL APPEARANCE:  VITAL SIGNS:  Blood pressure is elevated at 160/100, pulse 64.  HEENT:  No marked abnormalities.  NECK:  Normal.  LUNGS:  Clear.  CARDIOVASCULAR:  An S1 with an S2 but no clicks or significant murmurs.  ABDOMEN:  Benign.  EXTREMITIES:  He has no peripheral edema. There are no  musculoskeletal  deformities.  NEUROLOGIC:  Grossly intact.   EKG shows no significant abnormality.  Other labs are pending at this time.   PROBLEMS:  1. History of low potassium recently for which he has received potassium and     this will be checked today.  2. Allopurinol use.  At this time, I assume that this is for gout.  3. Elevated cholesterol in the past.  There has been difficulty with his     taking any type of cholesterol medications.  I will not push for any new     medications during this hospitalization and allow Dr. Riley Kill to follow     up on this.  4. Peripheral  vascular disease, status post left carotid endarterectomy.  5. History of kidney stones followed by Dr. Patsi Sears.  6. History of carcinoma of the prostate.  7. History of coronary artery disease.  The patient underwent coronary     artery bypass grafting in 2000.  8. History of gastroesophageal reflux disease with a prior stricture     requiring dilatation.  He has not had symptoms from this.  It is possible     that this could be playing a roll in his current symptomatology.  9. Questionable history of ankylosing spondylitis.  10.      Current chest discomfort.  It is very difficult to tell if there is     ischemia or not.  He continuously complains of chest discomfort.  He will     have to be admitted for his enzymes to be checked.  We will do a     Cardiolite scan on February 20, 2004.  Currently, Dr. Riley Kill is away.  If     the patient has a nonischemic Cardiolite, it would appear that more     aggressive evaluation will not be needed.  The patient did have a relook     angiogram done in September of 2003.  This study showed normal left     ventricular function and grafts that were patent.                                                Willa Rough, M.D.    Cleotis Lema  D:  02/19/2004  T:  02/19/2004  Job:  [id number]   cc:   Stacie Acres. White, M.D.  [address]  [place]  [place] [address]   Fax: [phone]

## 2011-04-11 NOTE — Op Note (Signed)
   Caleb Berry, DOME                         ACCOUNT NO.:  [id number]   MEDICAL RECORD NO.:  [id number]                   PATIENT TYPE:  AMB   LOCATION:  DAY                                  [facility]ACILITY[facility]  [facility]   PHYSICIAN:  Sigmund I. Patsi Sears, M.D.         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   OPERATION:  Cystourethroscopy, placement of proximal urethral guidewire in  the bladder, urethral dilation.   SURGEON:  Sigmund I. Patsi Sears, M.D.   ANESTHESIA:  General (LMA).   PREPARATION:  After appropriate preanesthesia, the patient was brought to  the operating room, placed on the operating table in the dorsal supine  position where general LMA anesthesia was introduced. He was then replaced  in the dorsal lithotomy position where the pubis was prepped with Betadine  solution, draped in the usual fashion.   DESCRIPTION OF PROCEDURE:  Cystourethroscopy was accomplished, which showed  excellent placement of the Urolume prosthesis. However, at the bladder neck,  there appeared to be bladder neck contracture, and a guidewire was passed  through the bladder neck contracture into the bladder, and the bladder neck  dilated easily to a size 24 [place].  Following this, cystoscopy was  accomplished, and showed excellent dilation of the bladder neck. The scope  was removed, bladder drained of fluid, urethral Xylocaine placed, the  patient awakened and given IV Toradol and taken to the recovery room in good  condition.     Sigmund I. Patsi Sears, M.D.    SIT/MEDQ  D:  07/21/2002  T:  07/22/2002  Job:  [id number]

## 2011-04-11 NOTE — Assessment & Plan Note (Signed)
[facility]                            CARDIOLOGY OFFICE NOTE   Caleb, Berry                         MRN:          [id number]  DATE:11/05/2006                            DOB:          06-04-1941    [name] is in for a followup visit. He really is doing quite well. He  still had problems with his bladder. He denies any chest pain.  He has been diagnosed recently with a peripheral neuropathy the etiology  of this is unclear.   CURRENT MEDICATIONS:  Include; allopurinol 100 mg daily,  hydrochlorothiazide 50 mg daily, Lopressor 50 mg p.o. b.i.d., Prilosec  20 mg daily, Zantac over-the-counter 1 q.h.s., K-Dur 20 mEq 1 t.i.d.,  aspirin 81 mg daily, Neurontin 100 mg t.i.d., B12 daily, and Plavix 75  mg daily.   PHYSICAL EXAMINATION:  He is alert, oriented gentleman in no acute  distress. Weight is 172, blood pressure 130/80, pulse 62. No carotid  bruits.  LUNGS: Fields clear to auscultation , percussion. The PMI is  nondisplaced. There are normal second and first heart sounds without  murmurs, rubs, or gallops. His distal pulses are excellent and intact.   Electrocardiogram demonstrates normal sinus rhythm essentially within  normal limits.   IMPRESSION:  1. Coronary artery disease with prior coronary artery bypass graft      surgery.  2. Hypercholesterolemia with multiple drug intolerances.  3. Status post carotid endarterectomy.  4. History of nephrolithiasis.  5. History of prostate cancer.  6. History of spondylosis.   PLAN:  1. Return to clinic in 6 months.  2. We will check a lipid profile and homocystine.  3. We will have him return to clinic in 6 months.     Arturo Morton. Riley Kill, MD, [facility]  Electronically Signed    TDS/MedQ  DD: 11/05/2006  DT: 11/05/2006  Job #: [id number]

## 2011-04-11 NOTE — Op Note (Signed)
[facility]  Patient:    Caleb Berry, Caleb Berry Visit Number: [id number] MRN: [id number]          Service Type: DSU Location: DAY Attending Physician:  Laqueta Jean Dictated by:   Vonzell Schlatter Patsi Sears, M.D. Proc. Date: 01/27/02 Admit Date:  01/27/2002                             Operative Report  PREOPERATIVE DIAGNOSIS:  Posterior urethral stricture.  POSTOPERATIVE DIAGNOSIS:  Posterior urethral stricture.  OPERATION: 1. Cystourethroscopy. 2. Urethral dilation. 3. Direct-vision internal urethrotomy. 4. UroLume stricture prosthesis placement.  SURGEON:  Sigmund I. Patsi Sears, M.D.  ANESTHESIA:  General (LMA).  PREPARATION:  After appropriate preanesthesia, the patient is brought to the operating room and placed on the operating table in the dorsal supine position where a general (LMA) anesthesia was introduced.  The patient was then re-placed in the dorsal lithotomy position, where the pubis was prepped with Betadine solution and draped in the usual fashion.  HISTORY:  The patient is a 70 year old married white male, who is status post radical prostatectomy for adenocarcinoma of the prostate in 1996.  The patients PSA normalized postoperatively, and he has done well with no urinary incontinence.  He is able to play golf and has no difficulty.  The patient noted that coincidental with his coronary artery bypass graft in 2000, that he had a "traumatic catheterization."  The patient has developed a posterior urethral stricture, status post office dilation, and now is for TUI stricture and possibly UroLume prosthesis.  DESCRIPTION OF PROCEDURE:  Cystourethroscopy was accomplished and shows a dense proximal urethral stricture.  A wire is placed through the stricture into the bladder.  Direct-vision urethrotomy is accomplished at 12 oclock. Cystoscopy then reveals that the patient has good anastomosis proximal to the stricture and no  evidence of any bladder neck contracture.  The bladder is normal with no trabeculation, no cellules, and no bladder, stone, or tumor.  A UroLume prosthesis is placed distal to the prostatic urethral anastomosis, measuring 3 cm.  The patient tolerated the procedure well, and the prosthesis was left in place.  The patient had his bladder drained with an 18 French red Robinson catheter, was awakened and taken to the recovery room in good condition. Dictated by:   Vonzell Schlatter Patsi Sears, M.D. Attending Physician:  Laqueta Jean DD:  01/27/02 TD:  01/27/02 Job: [id number]

## 2011-04-11 NOTE — Discharge Summary (Signed)
NAME:  Caleb Berry, Caleb Berry NO.:  [id number]   MEDICAL RECORD NO.:  [id number]                   PATIENT TYPE:  INP   LOCATION:  4729                                 FACILITY:  [facility]   PHYSICIAN:  Arturo Morton. Riley Kill, M.D. [facility]         DATE OF BIRTH:  04/06/1941   DATE OF ADMISSION:  02/19/2004  DATE OF DISCHARGE:  02/20/2004                                 DISCHARGE SUMMARY   DISCHARGE DIAGNOSES:  1. Malaise for a 48 hour period with chest pain radiating to the right upper     extremity in a patient with known three vessel coronary artery disease,     status post coronary artery bypass graft surgery in 2000.  2. Enzymes cycling negative x3.  Electrocardiogram non-diagnostic showing     sinus rhythm with no ST changes.  3. Protracted diarrhea, gastrointestinal consult obtained with their     recommendation for outpatient colonoscopy.   SECONDARY DIAGNOSES:  1. Three vessel coronary artery disease, status post stents to the proximal     and mid left anterior descending, coronary artery bypass graft surgery in     2000.  The following grafts were placed:  The left internal mammary     artery to the left anterior descending, the reverse saphenous vein graft     to the second diagonal, reverse saphenous vein graft to the first obtuse     marginal, and reverse saphenous vein graft to the posterior descending     artery.  2. Catheterization in September 2003.  Ejection fraction greater than 55%,     all grafts patent.  This study also showed that the left main had mild     irregularities, the left anterior descending had in-stent restenosis of     70% in the proximal and mid left anterior descending, the distal left     anterior descending was filled by the left internal mammary artery.  The     second diagonal fills by way of the reverse saphenous vein graft.  The     left circumflex had a 30% mid point stenosis.  The obtuse marginal had a     100% proximal  stenosis and fills by way of the saphenous vein graft     bypass.  Right coronary artery had a 95% mid point stenosis, 70/80%     tandem distal stenoses.  The posterior descending artery fills by way of     a saphenous vein graft.  3. Statin intolerance.  4. Dyslipidemia.  5. Cardiolite study in September 2001, negative, with left ventricular     ejection fraction of 65%.  6. Extracranial cerebrovascular occlusive disease, status post left carotid     endarterectomy.  7. History of nephrolithiasis, on chronic allopurinol therapy for this.  8. History of prostate cancer.  9. Gastroesophageal reflux disease/esophageal stricture which required     dilatation.   PROCEDURE:  On February 20, 2004, exercise Cardiolite study.  The patient  exercised to a stage IV Bruce, 10 minutes 30 seconds totally, stopped  secondary to dyspnea and leg fatigue.  Had no chest pain at any time.  Maximum heart rate reached was 150.  The target heart rate was 134.  The  results show ejection fraction 67%, no perfusion defects or wall motion  abnormalities with negative cardiac enzymes and a Cardiolite which was  negative for ischemia.  The patient will be discharged on February 20, 2004,  with early followup with Dr. Riley Kill.  The patient will go home on the  following medications:   DISCHARGE MEDICATIONS:  1. Lopressor 50 mg daily.  2. Hydrochlorothiazide 50 mg daily.  3. Potassium chloride 20 mEq daily.  4. Allopurinol 100 mg daily.  5. Prilosec 20 mg daily as needed.  6. Nitroglycerin 0.4 mg one tab under the tongue every five minutes x3 doses     as needed for chest pain.  7. Darvocet-N 100 as needed.   ACTIVITY:  As tolerated.   DIET:  Low sodium, low cholesterol diet.   FOLLOWUP:  1. He has an office visit with Dr. Riley Kill on Friday, March 13, 2004, at     2:30 in the afternoon.  2. He will follow up with [facility].  He was seen by Dr.     Arlyce Dice.  3. He has an appointment on February 27, 2004, at 2:00 in the afternoon for a     preoperative visit, and then has an appointment on March 05, 2004, at     2:00 in the afternoon for a colonoscopy.   HISTORY:  [name] has known coronary artery disease.  He is  followed by Dr. Riley Kill.  He has been doing well.  In the past few days, he  has had chest pressure.  This has persisted.  He has not taken  nitroglycerin.  However, currently today when he got up and tries to get  around he has discomfort in the chest with the question of some radiation to  the right arm.  He has undergone coronary artery bypass graft surgery in the  past.  He also has a history of left carotid endarterectomy, and has a  history of hypercholesterolemia, not currently on statin therapy secondary  to intolerance.  The patient feels that he is aching all over.  He has not  had any significant nausea or vomiting.  He presents to [facility] and is seen there by Dr. Myrtis Ser.   PLAN:  The patient will be admitted, enzymes will be cycled, Cardiolite  study will be done on February 20, 2004.  If the patient has non-ischemic  Cardiolite, more aggressive evaluation is not necessary.  The patient did  have a re-look angiogram done in September 2003, which showed normal left  ventricular function and grafts that were patent.   HOSPITAL COURSE:  The patient was admitted through the emergency room on  February 19, 2004, with complaint of chest pain with radiation to the right  arm.  Cardiac enzymes to rule out myocardial infarction were negative.  The  patient has been chest pain free during his admission to [facility].  He underwent exercise Cardiolite study on February 20, 2004, which  showed no perfusion defects, ejection fraction 67%.  He was seen here by GI  consult, Dr. Arlyce Dice for a recent history of protracted diarrhea and possible __________as a source of chest  pain.  He will have outpatient colonoscopy.      Maple Mirza, P.A.                    Arturo Morton. Riley Kill, M.D. [facility]    GM/MEDQ  D:  02/20/2004  T:  02/21/2004  Job:  [id number]   cc:   Stacie Acres. White, M.D.  [address]  [place]  [place] [address]  Fax: [phone]

## 2011-04-11 NOTE — H&P (Signed)
Caleb Berry, Caleb Berry NO.:  [id number]   MEDICAL RECORD NO.:  [id number]          PATIENT TYPE:  OIB   LOCATION:  2875                         FACILITY:  [facility]   PHYSICIAN:  Arturo Morton. Riley Kill, M.D. [facility] OF BIRTH:  03-18-1941   DATE OF ADMISSION:  07/23/2005  DATE OF DISCHARGE:                                HISTORY & PHYSICAL   CHIEF COMPLAINT:  Chest discomfort.   HISTORY OF PRESENT ILLNESS:  Mr. Haque is a very pleasant 70 year old well  known to me.  He has previously undergone revascularization surgery.  In May  of this year he underwent percutaneous intervention with a drug-eluting  stent to the proximal mid circumflex with the distal circumflex lesions  stented with a non-drug-eluting stent.  His grafts were patent from his  previous bypass surgery.  The patient had followup Cardiolite in June of  this year which was not ischemic.  He has done well, but 3 nights ago  developed substernal chest pain nearly identical to that which he  experienced at the time of balloon inflation.  With this in mind, it was the  feeling that there was a high likelihood of restenosis of the non-drug-  eluting stent location.  The patient's symptoms are identical to what he  previously experienced.  Therefore, it was felt that a restudy was indicated  given his previous experience with recurrent symptoms within days of  discharge from the hospital.  We discussed the various options with the  patient, and he was agreeable to proceed.   PRIOR HISTORY:  1.  Coronary artery disease with prior coronary artery bypass graft surgery      in 2000.  2.  History of peripheral vascular disease with prior carotid      endarterectomy.  3.  Hypercholesterolemia with significant intolerance to STATINS and OTHER      DRUGS.  4.  History of hypokalemia.  5.  History of nephrolithiasis on allopurinol.  6.  History of prostate cancer.  7.  Gastroesophageal reflux.  8.  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   ALLERGIES:  CODEINE and ALEVE.   MEDICATIONS:  1.  Allopurinol 100 mg daily.  2.  Lopressor 50 mg p.o. b.i.d.  3.  Hydrochloride 50 mg daily.  4.  Potassium 20 mEq daily.  5.  Plavix study drug 1 daily.  6.  Prilosec 20 mg daily.  7.  Isosorbide mononitrate 30 mg daily.  8.  Aspirin 325 mg daily.   DRUG INTOLERANCES:  The patient has had drug intolerances, and these include  multiple STATINS, __________, Deniece Ree.   SOCIAL HISTORY:  The patient is a Optician, dispensing.  His children are grown.  He is  married.  He does not smoke or use alcohol or illicit drugs.   REVIEW OF SYSTEMS:  Negative except as described in History of Present  Illness.   PHYSICAL EXAMINATION:  GENERAL:  The patient is an alert and oriented  gentleman in no acute distress.  VITAL SIGNS:  Blood pressure 130/59, weight 172.6, heart rate 65,  temperature 97.2, respiratory rate  16, O2 saturation 96%.  NECK:  Previous carotid endarterectomy scar.  CHEST:  Lung fields are clear to auscultation and percussion.  PMI is  nondisplaced with normal first and second heart sounds.   EKG reveals questionable prior inferior infarct, age indeterminate, with no  acute ST segment abnormalities.   Chest x-ray from Apr 05, 2005, reveals postoperative changes with no acute  abnormalities.   IMPRESSION:  1.  Coronary artery disease with prior coronary artery bypass graft surgery      and subsequent percutaneous intervention with recurrent ischemic-type      chest pain.  2.  Prior coronary artery bypass graft surgery with known patent grafts.  3.  Hypercholesterolemia with STATIN intolerance.  4.  Other problems as described above.   PLAN:  Risks, benefits, and alternatives have been discussed with the  patient.  Catheterization recommended and patient agreeable.      Arturo Morton. Riley Kill, M.D. [facility]  Electronically Signed     TDS/MEDQ  D:  07/23/2005  T:  07/23/2005   Job:  [id number]

## 2011-04-11 NOTE — Cardiovascular Report (Signed)
NAME:  Caleb Berry, Caleb Berry NO.:  [id number]   MEDICAL RECORD NO.:  [id number]          PATIENT TYPE:  INP   LOCATION:  1823                         FACILITY:  [facility]   PHYSICIAN:  Carole Binning, M.D. [facility] OF BIRTH:  May 20, 1941   DATE OF PROCEDURE:  04/02/2005  DATE OF DISCHARGE:                              CARDIAC CATHETERIZATION   PROCEDURES PERFORMED:  1.  Left heart catheterization with coronary angiography, bypass graft      angiography, left ventriculography.  2.  Percutaneous transluminal cardiac angioplasty with placement of a bare      metal stent in the second obtuse marginal branch.  3.  Percutaneous transluminal cardiac angioplasty with placement of a drug-      eluting stent  in the mid left circumflex coronary.   INDICATIONS:  The patient is a 70 year old male with history of previous  coronary artery bypass surgery. He presented to the office today with  symptoms compatible with an acute coronary syndrome that had been  progressive over the past several days. He was referred for urgent cardiac  catheterization.  Of note, he did have an elevated troponin level consistent  with a non-ST-segment-elevation myocardial infarction.   CATHETERIZATION PROCEDURAL NOTE:  A 6-French sheath was placed in the right  femoral artery. The native coronary arteries were imaged with 6-French JL-4  and JR-4 catheters. The saphenous vein grafts were all imaged with a JR-4  catheter.  The internal mammary artery was imaged with a JR-4 catheter. Left  ventriculography was performed with an angled pigtail catheter. Contrast was  Omnipaque. There were no complications.   RESULTS:   HEMODYNAMICS:  Left ventricular pressure 124/8. Aortic pressure 120/74.  There is no aortic valve gradient.   LEFT VENTRICULOGRAM:  Wall motion is normal with ejection fraction estimated  at 60%. There is no mitral regurgitation.   CORONARY ANGIOGRAPHY:  Left main has a 27 stenosis  proximally.   Left anterior descending artery has a stent in the proximal vessel. There is  a diffuse 80% stenosis in the proximal to mid LAD including the stented  segment of vessel. The distal LAD fills via saphenous vein graft and an  internal mammary graft.   Left circumflex has an 80% stenosis in the mid vessel. There is a first  obtuse marginal which is 100% occluded proximally fills via saphenous vein  graft. There is a large second obtuse marginal which has a 99% stenosis in  the mid to distal portion of this branch.   Right coronary artery is diffusely diseased with a diffuse 80% stenosis  throughout proximal to mid vessel and a discrete 95% stenosis in the mid  vessel as well as a 95% stenosis in the distal vessel. The distal vessel  fills via saphenous vein graft.   Left internal mammary artery to the distal LAD is atretic. It is very small  caliber but still patent. However, there is significant competitive flow  filling the distal LAD which is coming via saphenous vein graft.   There is a saphenous vein graft to the diagonal branch arising from mid  LAD.  This graft is widely patent, filling a normal-sized diagonal branch. This  also fills retrograde into the mid and distal LAD.   Saphenous vein graft to the first obtuse marginal branch is patent  throughout its course. This fills a relatively small first obtuse marginal.   There is a sequential saphenous vein graft to the distal right coronary and  posterior descending artery. This is patent, filling a normal size posterior  descending artery and small posterolateral branch more distally.   IMPRESSION:  1.  Normal left ventricular systolic function.  2.  Native three-vessel coronary artery disease.  3.  Status post coronary bypass surgery. All grafts were patent. The      internal mammary artery is atretic and still patent. However, there is      significant competitive flow of the distal LAD filled via the  saphenous      vein graft, and, thus, it does appear to be adequately perfused. There      is a new 99% stenosis in the second obtuse marginal branch as well as      significant disease in the mid left circumflex.   PLAN:  Percutaneous coronary intervention of the native circumflex and  obtuse marginal.   PTCA PROCEDURE NOTE:  Heparin and Integrilin were administered per protocol.  We used a 6-French CLS 3.5 guiding catheter. A Hi-Torque floppy wire was  advanced under fluoroscopic guidance into the distal aspect of the left  circumflex. We then advanced a 2.08 x 15 mm Maverick balloon and positioned  across the lesion. This balloon was inflated to 8 atmospheres. However,  there was still a significant waist in the balloon, and the lesion failed to  yield with this. We, therefore, advanced a 2.0 x 12-mm Quantum balloon and  inflated this to 14 atmospheres. We were able to adequately get the waist of  the balloon out. We did a second inflation to 12 atmospheres. Angiographic  images revealed improvement of vessel lumen. However, there is area of  haziness and possible dissection at the PTCA site. We, therefore, positioned  a 2.0 x 15 mm Mini Vision stent across the diseased segment of vessel in the  second obtuse marginal and deployed the stent at 7 atmospheres. We went back  with a 2.0 x 12-mm Quantum balloon and inflated this to 16 atmospheres in  the proximal aspect of stent and 12 atmospheres in the distal aspect of  stent. Angiographic images were obtained showing patency of the obtuse  marginal with less than 10% residual stenosis at the stent site and TIMI-3  flow.   We then turned our attention the mid left circumflex. We positioned a 2.5 x  23-mm Cypher drug-eluting stent across the diseased segment of vessel. This  stent was deployed at 18 atmospheres. Angiographic images were obtained showing an excellent result at the stent site with 0% residual stenosis and  TIMI-3  flow.   At the at the conclusion of procedure, a 6-French AngioSeal vascular closure  device was placed in the right femoral artery with good hemostasis.   COMPLICATIONS:  None.   RESULTS:  1.  Successful percutaneous transluminal cardiac angioplasty with placement      of a bare metal stent in the second obtuse marginal branch. A 99%      stenosis was reduced to less than 10% residual with TIMI-3 flow.  2.  Successful percutaneous transluminal cardiac angioplasty with placement      of a drug-eluting stent in the  mid left circumflex. An 80% stenosis was      reduced to 0% residual with TIMI-3 flow.   PLAN:  Integrilin will be continued for 18 hours. The patient has been  enrolled in the TRITON study and will be treated with the study drug for 1  year.      MWP/MEDQ  D:  04/02/2005  T:  04/02/2005  Job:  [id number]   cc:   Stacie Acres. White, M.D.  [address]  [place]  [place] [address]  Fax: [phone]   Cayce Quezada. Riley Kill, M.D. [facility]   Cardiac Catheterization Lab

## 2011-04-11 NOTE — Cardiovascular Report (Signed)
NAME:  Caleb Berry, Caleb Berry NO.:  [id number]   MEDICAL RECORD NO.:  [id number]                   PATIENT TYPE:  INP   LOCATION:  3728                                 FACILITY:  [facility]   PHYSICIAN:  Veneda Melter, M.D. [facility]               DATE OF BIRTH:  06-02-1941   DATE OF PROCEDURE:  08/15/2002  DATE OF DISCHARGE:  08/15/2002                              CARDIAC CATHETERIZATION   PROCEDURES PERFORMED:  1. Left heart catheterization.  2. Left ventriculogram.  3. Selective coronary angiography.  4. Selective angiography, saphenous vein and internal mammary bypass grafts.   DIAGNOSES:  1. Native three-vessel coronary artery disease.  2. Patent saphenous vein internal mammary bypass graft.  3. Normal left ventricular systolic function.   HISTORY:  The patient is a 70 year old gentleman with a known history of  coronary artery disease who had previously undergone percutaneous  intervention with stent placed in the LAD as well as coronary artery bypass  graft surgery which was performed in 2000. The patient presents now with  recurrent substernal chest discomfort, which he feels is similar to his  prior cardiac pain.  He was admitted to the hospital and ruled out for acute  myocardial infarction and presents for further assessment.   TECHNIQUE:  Informed consent was obtained, patient brought to the cardiac  catheterization lab, and a 6 French sheath was placed in the right femoral  artery using the modified Seldinger technique. A 6 [place] and JR4  catheters were then used to engage the left and right coronary arteries and  selective angiography performed in various projections using manual  injections of contrast. A JR4 catheter was then used to engage three  saphenous veins and one internal mammary bypass grafts.  Again, selective  angiography was performed using manual injections of contrast.  Subsequently, 6 French pigtail catheter was then advanced to  the left  ventricle and left ventriculogram performed using power injections of  contrast.  At the termination of the case, the catheters and sheaths were  removed and manual pressure applied until adequate hemostasis was achieved.  The patient tolerated the procedure well and was transferred to the ward in  stable condition.   FINDINGS:  1. Left main trunk:  Large caliber vessel with mild irregularities.  2. LAD:  This is a medium caliber vessel that provides two small diagonal     branches in the mid section.  The LAD then extends to the apex.  There     was evidence of a previously placed stent in the proximal and mid section     of the vessel encompassing the first diagonal branch extending up to the     second diagonal branch. There is diffuse in-stent re-stenosis with 70% in     the proximal and mid LAD.  The distal LAD fills via a LIMA graft and     exhibits  brisk flow without critical stenosis. The second diagonal branch     fills via saphenous vein graft and exhibits moderate disease of 30-40%.     Retrograde flow into the distal LAD is also noted.  3. Left circumflex artery:  This is a medium caliber vessel that provides a     first marginal branch in the proximal segment, several small     posterolateral branches distally.  The AV circumflex has moderate disease     of 30% in the mid section. The marginal branch is occluded proximally. It     fills via saphenous vein graft and has mild irregularities.  4. Right coronary artery:  Dominant.  This is a medium caliber vessel that     provides the posterior descending artery and a posterior ventricular     branch in its terminal segment. The right coronary artery has a severe     narrowing of 95% in the mid section that is heavily calcified. There is     then a long tubular narrowing of 70-80% in the distal section prior to     the bifurcation.  The posterior descending artery and posterior     ventricular branch fill via  sequential saphenous vein graft and has mild     irregularities.  5. LIMA to the LAD is patent.  6. Saphenous vein graft to the second diagonal branch of the LAD is patent.  7. Saphenous vein graft to OM-1 in the left circumflex artery is patent with     mild irregularities.  8. Saphenous vein graft to the PDA of the right coronary artery with     continuation to the posterior ventricular branch is patent as well.   LEFT VENTRICULOGRAM:  Normal end-systolic and end-diastolic dimensions.  Overall, left ventricular function is well preserved, ejection fraction of  greater than 55%.  No mitral regurgitation.  LV pressure is 140/5, aortic is  140/80, LVEDP equals 15.   ASSESSMENT AND PLAN:  The patient is a 70 year old gentleman with a history  of coronary artery bypass graft surgery who presents with substernal chest  discomfort.  He has stable coronary artery disease and appears to be well  revascularized.  Continued medical therapy will be pursued in regards to his  cardiovascular disease and other causes of chest pain investigated.                                                 Veneda Melter, M.D. [facility]    NG/MEDQ  D:  08/15/2002  T:  08/16/2002  Job:  [id number]   cc:   Arturo Morton. Riley Kill, M.D. [facility]   Stacie Acres. Cliffton Asters, M.D.

## 2011-04-11 NOTE — Op Note (Signed)
[facility]  Patient:    Caleb Berry, Caleb Berry Visit Number: [id number] MRN: [id number]          Service Type: SUR Location: 3300 [id number] Attending Physician:  Melvenia Needles Dictated by:   Denman George, M.D. Proc. Date: 07/22/01 Admit Date:  07/22/2001   CC:         Maisie Fus D. Riley Kill, M.D. [facility]  Stevphen Rochester, M.D. [facility]   Operative Report  SURGEON:  Denman George, M.D.  ASSISTANT:  Lissa Merlin, P.A.  ANESTHETIC:  General endotracheal.  ANESTHESIOLOGIST:  Cliffton Asters. Ivin Booty, M.D.  PREOPERATIVE DIAGNOSIS:  Severe left internal carotid artery stenosis.  POSTOPERATIVE DIAGNOSIS:  Severe left internal carotid artery stenosis.  PROCEDURE:  Left carotid endarterectomy with Dacron patch angioplasty.  CLINICAL NOTE:  This is a 70 year old male with a history of coronary artery bypass.  He was found to have a moderate left internal carotid artery stenosis at the time of bypass surgery.  This has been followed in the office and has shown progressive stenosis now severe.  It was recommended that the patient undergo left carotid endarterectomy for prevention of stroke risk.  He consented for surgery.  Risks and benefits of the operative procedure were explained to the patient in detail, including the potential risks of MI, CVA, and death of approximately 1-2%.  OPERATIVE PROCEDURE:  The patient was brought to the operating room in stable condition.  General endotracheal anesthesia was induced.  A Foley catheter and arterial line were in place.  He was placed in the supine position.   The left neck was prepped and draped in a sterile fashion.  A curvilinear skin incision was made along the anterior border of the left sternocleidomastoid muscle.  Subcutaneous tissue and platysma divided with electrocautery.  Dissection carried deeply through the subcutaneous tissue to the carotid sheath.  The facial vein was ligated with 2-0 silk and  divided. The carotid bifurcation was exposed.  The carotid system was noted to be generally small in caliber.  There was plaque at the carotid bifurcation extending into the origin of the left internal carotid artery.  The common carotid artery was dissected down to the omohyoid muscle and encircled with a vessel loop.  The vagus nerve was reflected free of harm. Distal dissection carried up to the carotid bifurcation, where the superior thyroid and external carotid were encircled with vessel loops at their origin. The internal carotid then followed distally.  The internal carotid was noted to be quite small in caliber, too small to safely accept a shunt.  The hypoglossal nerve was reflected superiorly.  The distal internal carotid artery was encircled with a vessel loop.  The patient was administered 7000 units of heparin intravenously.  An ACT run, which measured 235.  An additional 3000 units of heparin were administered and the ACT reached 275.  The carotid vessels were then controlled with clamps.  A longitudinal arteriotomy was made in the distal common carotid artery.  The arteriotomy was extended across the carotid bulb and up into the internal carotid artery.  There was a space occupying plaque at the origin of the internal carotid artery with minimal calcification.  An 80% stenosis estimated.  An endarterectomy elevator was used to remove the plaque.  The endarterectomy was carried down into the common carotid artery, where the plaque was divided transversely with Potts scissors.  The plaque then raised up into the bulb, where the superior thyroid and external carotid were  endarterectomized using an eversion technique.  The plaque was then removed from the internal carotid artery and feathered out well distally.  Fragments of plaque were removed with plaque forceps.  The site was irrigated with heparin saline solution.  A Finesse patch was then placed over the endarterectomy  site with running 6-0 Prolene suture.  At completion of this, all vessels were well-flushed.  The clamps were removed directly initial antegrade flow up the external carotid artery, following this the internal carotid was released.  Excellent pulse on Doppler signal in the distal internal carotid artery.  The patient was administered 50 mg of Protamine intravenously.  Adequate hemostasis was obtained.  Sponge, needle and instrument counts were correct.  The sternocleidomastoid fascia closed with running 2-0 Vicryl suture. Platysma closed with running 3-0 Vicryl suture.  Skin closed with 4-0 Monocryl.  Half-inch Steri-Strips were applied.  The patient tolerated the procedure well.  Anesthesia reversed in the operating room, the patient awakened readily, moved all extremities to command.  He was transferred to recovery room in stable condition. Dictated by:   Denman George, M.D. Attending Physician:  Melvenia Needles DD:  07/22/01 TD:  07/22/01 Job: [id number]

## 2011-05-19 ENCOUNTER — Ambulatory Visit (HOSPITAL_BASED_OUTPATIENT_CLINIC_OR_DEPARTMENT_OTHER)
Admission: RE | Admit: 2011-05-19 | Discharge: 2011-05-19 | Disposition: A | Discharge status: Home / Self Care | Payer: Medicare Other | Source: Ambulatory Visit | Attending: Urology | Admitting: Urology

## 2011-05-19 DIAGNOSIS — I2510 Atherosclerotic heart disease of native coronary artery without angina pectoris: Secondary | ICD-10-CM | POA: Insufficient documentation

## 2011-05-19 DIAGNOSIS — M109 Gout, unspecified: Secondary | ICD-10-CM | POA: Insufficient documentation

## 2011-05-19 DIAGNOSIS — Z951 Presence of aortocoronary bypass graft: Secondary | ICD-10-CM | POA: Insufficient documentation

## 2011-05-19 DIAGNOSIS — C61 Malignant neoplasm of prostate: Secondary | ICD-10-CM | POA: Insufficient documentation

## 2011-05-19 DIAGNOSIS — K219 Gastro-esophageal reflux disease without esophagitis: Secondary | ICD-10-CM | POA: Insufficient documentation

## 2011-05-19 DIAGNOSIS — Z9079 Acquired absence of other genital organ(s): Secondary | ICD-10-CM | POA: Insufficient documentation

## 2011-05-19 DIAGNOSIS — N35919 Unspecified urethral stricture, male, unspecified site: Secondary | ICD-10-CM | POA: Insufficient documentation

## 2011-05-19 DIAGNOSIS — I10 Essential (primary) hypertension: Secondary | ICD-10-CM | POA: Insufficient documentation

## 2011-05-19 DIAGNOSIS — K625 Hemorrhage of anus and rectum: Secondary | ICD-10-CM | POA: Insufficient documentation

## 2011-05-19 DIAGNOSIS — Z01812 Encounter for preprocedural laboratory examination: Secondary | ICD-10-CM | POA: Insufficient documentation

## 2011-05-19 HISTORY — PX: OTHER SURGICAL HISTORY: SHX169

## 2011-05-19 LAB — POCT I-STAT, CHEM 8
BUN: 21 mg/dL (ref 6–23)
Calcium, Ion: 1.25 mmol/L (ref 1.12–1.32)
Chloride: 101 mEq/L (ref 96–112)
Creatinine, Ser: 1.3 mg/dL (ref 0.50–1.35)
Glucose, Bld: 116 mg/dL — ABNORMAL HIGH (ref 70–99)
TCO2: 24 mmol/L (ref 0–100)

## 2011-05-22 ENCOUNTER — Telehealth: Payer: Self-pay | Admitting: Cardiology

## 2011-05-22 MED ORDER — METOPROLOL TARTRATE 50 MG PO TABS: 50.0000 mg | ORAL_TABLET | Freq: Two times a day (BID) | ORAL | Status: DC

## 2011-05-22 NOTE — Telephone Encounter (Signed)
[facility] [address] [phone], metoprolol 50 mg needs 1 weeks worth didn't call  mail order in time

## 2011-06-05 NOTE — Op Note (Signed)
  NAME:  Caleb Berry, Caleb Berry                ACCOUNT NO.:  [id number]  MEDICAL RECORD NO.:  [id number]  LOCATION:                                 FACILITY:  PHYSICIAN:  Jurni Cesaro I. Patsi Sears, M.D.DATE OF BIRTH:  06/20/1941  DATE OF PROCEDURE:  05/19/2011 DATE OF DISCHARGE:                              OPERATIVE REPORT   PREOPERATIVE DIAGNOSIS:  Proximal urethral stricture.  POSTOPERATIVE DIAGNOSIS:  Proximal urethral stricture.  OPERATION:  Cystourethroscopy, balloon dilation of the proximal urethral stricture, Hayman dilation of the stricture, cauterization of the stricture site.  SURGEON:  Foye Damron I. Patsi Sears, M.D.  ANESTHESIA:  General LMA.  PREPARATION:  After appropriate preanesthesia, the patient was brought to the operating room, placed on the operating room table in dorsal supine position where general LMA anesthesia was introduced.  He was then replaced in dorsal lithotomy position where the pubis was preppedwith Betadine solution and draped in usual fashion.  REVIEW OF HISTORY:  [name] is a 71 year old male, status post radical prostatectomy, with subsequent carotid endarterectomy with Foley catheter trauma to the urethra.  The patient developed urethral stricture disease and currently self intermittent caths with occasional dilation.  He needs to have balloon dilation today for proximal urethral stricture.  He is unable to self-cath.  Note also that the patient has some bleeding when he strains to have bowel movement and this will be investigated also.  DESCRIPTION OF PROCEDURE:  Cystourethroscopy accomplished and the distal urethra appears normal.  The proximal urethra appears strictured at the level of prostatic fossa and bladder neck.  The sensor wire was passed through the strictured area into the bladder.  Over this, a Cook balloon dilator was placed and balloon dilation was accomplished for 3 minutes at 18 atmospheres pressure.  The balloon was removed and  repeat cystoscopy still showed strictured area in the prostatic urethra. Therefore, I used the [place] dilators to dilate to a size 32-French. Bleeding sites were noted in the strictured area and these were electrocauterized.  Cystoscopy revealed a normal-appearing bladder base with clear efflux of both orifices.  There was no evidence of bladder stone, tumor or diverticular formation.  The bladder was drained of fluid and no catheter was placed.  He was given IV acetaminophen.  He was awakened and taken to the recovery room in good condition.     Gustaf Mccarter I. Patsi Sears, M.D.     SIT/MEDQ  D:  05/19/2011  T:  05/19/2011  Job:  [id number]  Electronically Signed by Jethro Bolus M.D. on 06/05/2011 08:04:06 AM

## 2011-08-14 LAB — I-STAT 8, (EC8 V) (CONVERTED LAB)
BUN: 14
Bicarbonate: 26.5 — ABNORMAL HIGH
Chloride: 104
Glucose, Bld: 110 — ABNORMAL HIGH
HCT: 45
Operator id: 268271
pCO2, Ven: 41.6 — ABNORMAL LOW

## 2011-08-19 LAB — APTT: aPTT: 23 — ABNORMAL LOW

## 2011-08-19 LAB — CROSSMATCH: Antibody Screen: NEGATIVE

## 2011-08-19 LAB — CBC
HCT: 30.6 — ABNORMAL LOW
HCT: 33.8 — ABNORMAL LOW
Hemoglobin: 10.4 — ABNORMAL LOW
Hemoglobin: 13.2
MCHC: 33.1
MCHC: 33.8
MCV: 78.6
MCV: 79.4
Platelets: 274
Platelets: 298
RBC: 3.9 — ABNORMAL LOW
RBC: 5.05
RDW: 15.5
RDW: 15.9 — ABNORMAL HIGH
WBC: 10.7 — ABNORMAL HIGH
WBC: 12.8 — ABNORMAL HIGH

## 2011-08-19 LAB — BASIC METABOLIC PANEL
BUN: 22
CO2: 31
Calcium: 10.1
Calcium: 8.3 — ABNORMAL LOW
Creatinine, Ser: 1.39
GFR calc Af Amer: >60
GFR calc non Af Amer: 51 — ABNORMAL LOW
GFR calc non Af Amer: 59 — ABNORMAL LOW
Glucose, Bld: 143 — ABNORMAL HIGH
Potassium: 4.6
Sodium: 138

## 2011-08-19 LAB — URINALYSIS, ROUTINE W REFLEX MICROSCOPIC
Ketones, ur: NEGATIVE
Leukocytes, UA: NEGATIVE
Nitrite: POSITIVE — AB
Protein, ur: >300 — AB
pH: 7

## 2011-08-19 LAB — DIFFERENTIAL
Basophils Absolute: 0
Lymphocytes Relative: 8 — ABNORMAL LOW
Lymphs Abs: 1.1
Monocytes Absolute: 0.5
Monocytes Relative: 4
Neutro Abs: 11.2 — ABNORMAL HIGH

## 2011-08-19 LAB — URINE CULTURE

## 2011-08-19 LAB — POCT HEMOGLOBIN-HEMACUE: Hemoglobin: 14.7

## 2011-08-19 LAB — URINE MICROSCOPIC-ADD ON

## 2011-08-21 ENCOUNTER — Ambulatory Visit
Admission: RE | Admit: 2011-08-21 | Discharge: 2011-08-21 | Disposition: A | Discharge status: Home / Self Care | Payer: Medicare Other | Source: Ambulatory Visit | Attending: Pulmonary Disease | Admitting: Pulmonary Disease

## 2011-08-21 ENCOUNTER — Other Ambulatory Visit: Payer: Self-pay | Admitting: Pulmonary Disease

## 2011-08-21 DIAGNOSIS — R05 Cough: Secondary | ICD-10-CM

## 2011-09-01 LAB — CBC
HCT: 43.2
HCT: 43.2
Hemoglobin: 13.8
Hemoglobin: 14
Hemoglobin: 14.1
MCHC: 32.8
MCV: 81.1
Platelets: 344
Platelets: 354
RDW: 14.9
RDW: 15.1
WBC: 5.4
WBC: 8.7

## 2011-09-01 LAB — LIPID PANEL
LDL Cholesterol: 195 — ABNORMAL HIGH
Total CHOL/HDL Ratio: 7.8
VLDL: 58 — ABNORMAL HIGH

## 2011-09-01 LAB — CK TOTAL AND CKMB (NOT AT ARMC)
CK, MB: 1.6
CK, MB: 1.7
CK, MB: 1.8
Relative Index: 1.3
Relative Index: 1.3
Relative Index: 1.3
Total CK: 126
Total CK: 132
Total CK: 140

## 2011-09-01 LAB — DIFFERENTIAL
Basophils Absolute: 0
Basophils Relative: 1
Eosinophils Absolute: 0.1 — ABNORMAL LOW
Monocytes Absolute: 0.4
Monocytes Relative: 8
Neutro Abs: 3.4
Neutrophils Relative %: 63

## 2011-09-01 LAB — COMPREHENSIVE METABOLIC PANEL
ALT: 28
Albumin: 4
Alkaline Phosphatase: 50
BUN: 14
Chloride: 99
Glucose, Bld: 113 — ABNORMAL HIGH
Potassium: 4.7
Sodium: 135
Total Bilirubin: 0.6
Total Protein: 6.8

## 2011-09-01 LAB — BASIC METABOLIC PANEL
BUN: 18
Chloride: 100
Glucose, Bld: 97
Potassium: 4.1

## 2011-09-01 LAB — APTT: aPTT: 114 — ABNORMAL HIGH

## 2011-09-01 LAB — POCT CARDIAC MARKERS
CKMB, poc: 1.7
CKMB, poc: 2.1
Myoglobin, poc: 104
Myoglobin, poc: 123
Operator id: 234501
Operator id: 234501
Troponin i, poc: <0.05
Troponin i, poc: <0.05

## 2011-09-01 LAB — PROTIME-INR
INR: 1
Prothrombin Time: 13.3

## 2011-09-01 LAB — TROPONIN I: Troponin I: 0.02

## 2011-09-01 LAB — HEPARIN LEVEL (UNFRACTIONATED): Heparin Unfractionated: 0.43

## 2011-09-01 LAB — HEMOGLOBIN A1C: Mean Plasma Glucose: 151

## 2011-09-08 ENCOUNTER — Ambulatory Visit (INDEPENDENT_AMBULATORY_CARE_PROVIDER_SITE_OTHER): Payer: Medicare Other | Admitting: Cardiology

## 2011-09-08 ENCOUNTER — Encounter: Payer: Self-pay | Admitting: Cardiology

## 2011-09-08 DIAGNOSIS — I10 Essential (primary) hypertension: Secondary | ICD-10-CM

## 2011-09-08 DIAGNOSIS — E7800 Pure hypercholesterolemia, unspecified: Secondary | ICD-10-CM

## 2011-09-08 DIAGNOSIS — I2510 Atherosclerotic heart disease of native coronary artery without angina pectoris: Secondary | ICD-10-CM

## 2011-09-08 DIAGNOSIS — N200 Calculus of kidney: Secondary | ICD-10-CM

## 2011-09-08 LAB — BASIC METABOLIC PANEL
BUN: 20 mg/dL (ref 6–23)
CO2: 31 mEq/L (ref 19–32)
Calcium: 9.8 mg/dL (ref 8.4–10.5)
Creatinine, Ser: 1.5 mg/dL (ref 0.4–1.5)

## 2011-09-08 MED ORDER — NITROGLYCERIN 0.4 MG SL SUBL: 0.4000 mg | SUBLINGUAL_TABLET | SUBLINGUAL | Status: DC | PRN

## 2011-09-08 NOTE — Assessment & Plan Note (Signed)
Cannot take any of the agents, statins or otherwise.

## 2011-09-08 NOTE — Progress Notes (Signed)
HPI:  Caleb Berry is doing pretty well.  Plays golf two times per week.  Denies any chest pain.  Does not tolerate statins or replacements.  Has had bronchitis, and seen Dr. Nira Retort with short course of steroids and antibiotics.  Also, some cramps in both groins, and pain in the hip, and was told by Dr. Ethelene Hal that he might have PVD per patient report.  May get a urethral implant to help with self catheterization.   Current Outpatient Prescriptions  Medication Sig Dispense Refill  . allopurinol (ZYLOPRIM) 100 MG tablet Take 100 mg by mouth daily.      Marland Kitchen aspirin 81 MG tablet Take 81 mg by mouth daily.        . cetirizine (ZYRTEC) 10 MG tablet Take 10 mg by mouth as needed.       . gabapentin (NEURONTIN) 100 MG capsule Take 100 mg by mouth 4 (four) times daily.       . hydrochlorothiazide 50 MG tablet Take 25 mg by mouth 2 (two) times daily.       Marland Kitchen HYDROcodone-acetaminophen (VICODIN) 5-500 MG per tablet Take 1 tablet by mouth every 6 (six) hours as needed.        Marland Kitchen lisinopril (PRINIVIL,ZESTRIL) 2.5 MG tablet Take 1 tablet by mouth daily.      . magnesium oxide (MAG-OX) 400 MG tablet Take 1 tablet (400 mg total) by mouth daily.      . metoprolol (LOPRESSOR) 50 MG tablet Take 1 tablet (50 mg total) by mouth 2 (two) times daily.  14 tablet  1  . mometasone (NASONEX) 50 MCG/ACT nasal spray 2 sprays by Nasal route as needed.       Marland Kitchen omeprazole (PRILOSEC OTC) 20 MG tablet Take 20 mg by mouth daily.        . potassium chloride SA (K-DUR,KLOR-CON) 20 MEQ tablet Take 0.5 tablets by mouth Twice daily.      . ranitidine (ZANTAC) 150 MG capsule Take 150 mg by mouth every evening.          Allergies  Allergen Reactions  . All Day Pain   . Codeine   . Naproxen Sodium     Past Medical History  Diagnosis Date  . CAD (coronary artery disease)   . HTN (hypertension)   . History of prostate cancer   . Nephrolithiasis     Past Surgical History  Procedure Date  . Urethral stricture dilatation      Family History  Problem Relation Age of Onset  . Hypertension      History   Social History  . Marital Status: Married    Spouse Name: N/A    Number of Children: N/A  . Years of Education: N/A   Occupational History  . pastor    Social History Main Topics  . Smoking status: Former Smoker    Types: Cigarettes    Quit date: 11/24/1968  . Smokeless tobacco: Not on file  . Alcohol Use: No  . Drug Use: No  . Sexually Active: Not on file   Other Topics Concern  . Not on file   Social History Narrative  . No narrative on file    ROS: Please see the HPI.  All other systems reviewed and negative.  PHYSICAL EXAM:  BP 148/90  Pulse 62  Resp 18  Ht 5\' 8"  (1.727 m)  Wt 172 lb (78.019 kg)  BMI 26.15 kg/m2  General: Well developed, well nourished, in no acute distress. Head:  Normocephalic and atraumatic. Neck: no JVD Lungs: Clear to auscultation and percussion. Heart: Normal S1 and S2. PMI non displaced.   No murmur Abdomen:  Normal bowel sounds; soft; non tender; no organomegaly Pulses: Pulses normal in all 4 extremities.  Excellent DP and PT demonstrated to patient.  No bruits on exam, and full femoral pulses.   Extremities: No clubbing or cyanosis. No edema. Neurologic: Alert and oriented x 3.  EKG:  NSR.  Cannot exclude old inferior MI.    ASSESSMENT AND PLAN:

## 2011-09-08 NOTE — Assessment & Plan Note (Signed)
Borderline control, but usually better at home

## 2011-09-08 NOTE — Assessment & Plan Note (Signed)
Appears stable at present.  No progression of symptoms.

## 2011-09-08 NOTE — Assessment & Plan Note (Signed)
Uses HCTZ routinely, and is on replacement, but often has hypokalemia, so will recheck.  Also check MG.

## 2011-09-08 NOTE — Patient Instructions (Signed)
Your physician wants you to follow-up in: 6 MONTHS. You will receive a reminder letter in the mail two months in advance. If you don't receive a letter, please call our office to schedule the follow-up appointment.  Your physician recommends that you have lab work today: BMP and Magnesium  Your physician recommends that you continue on your current medications as directed. Please refer to the Current Medication list given to you today.

## 2011-11-26 ENCOUNTER — Encounter (HOSPITAL_BASED_OUTPATIENT_CLINIC_OR_DEPARTMENT_OTHER): Payer: Self-pay | Admitting: *Deleted

## 2011-11-26 ENCOUNTER — Other Ambulatory Visit: Payer: Self-pay | Admitting: Urology

## 2011-11-26 NOTE — Progress Notes (Signed)
NPO AFTER MN. ARRIVES AT 0830. NEEDS ISTAT. CURRENT EKG AND CXR IN EPIC. WILL TAKE METOPROLOL, GABAPENTIN, AND PRILOSEC AM OF SURG. W/ SIPS OF WATER.

## 2011-11-27 ENCOUNTER — Encounter (HOSPITAL_BASED_OUTPATIENT_CLINIC_OR_DEPARTMENT_OTHER): Payer: Self-pay | Admitting: Anesthesiology

## 2011-11-27 ENCOUNTER — Ambulatory Visit (HOSPITAL_BASED_OUTPATIENT_CLINIC_OR_DEPARTMENT_OTHER)
Admission: RE | Admit: 2011-11-27 | Discharge: 2011-11-27 | Disposition: A | Discharge status: Home / Self Care | Payer: Medicare Other | Source: Ambulatory Visit | Attending: Urology | Admitting: Urology

## 2011-11-27 ENCOUNTER — Encounter (HOSPITAL_BASED_OUTPATIENT_CLINIC_OR_DEPARTMENT_OTHER)
Admission: RE | Disposition: A | Discharge status: Home / Self Care | Payer: Self-pay | Source: Ambulatory Visit | Attending: Urology

## 2011-11-27 ENCOUNTER — Ambulatory Visit (HOSPITAL_BASED_OUTPATIENT_CLINIC_OR_DEPARTMENT_OTHER): Payer: Medicare Other | Admitting: Anesthesiology

## 2011-11-27 ENCOUNTER — Encounter (HOSPITAL_BASED_OUTPATIENT_CLINIC_OR_DEPARTMENT_OTHER): Payer: Self-pay | Admitting: *Deleted

## 2011-11-27 DIAGNOSIS — N200 Calculus of kidney: Secondary | ICD-10-CM | POA: Insufficient documentation

## 2011-11-27 DIAGNOSIS — R32 Unspecified urinary incontinence: Secondary | ICD-10-CM | POA: Insufficient documentation

## 2011-11-27 DIAGNOSIS — Z79899 Other long term (current) drug therapy: Secondary | ICD-10-CM | POA: Insufficient documentation

## 2011-11-27 DIAGNOSIS — N35919 Unspecified urethral stricture, male, unspecified site: Secondary | ICD-10-CM | POA: Insufficient documentation

## 2011-11-27 DIAGNOSIS — C61 Malignant neoplasm of prostate: Secondary | ICD-10-CM | POA: Insufficient documentation

## 2011-11-27 DIAGNOSIS — I10 Essential (primary) hypertension: Secondary | ICD-10-CM | POA: Insufficient documentation

## 2011-11-27 DIAGNOSIS — Z7982 Long term (current) use of aspirin: Secondary | ICD-10-CM | POA: Insufficient documentation

## 2011-11-27 HISTORY — DX: Bladder-neck obstruction: N32.0

## 2011-11-27 HISTORY — PX: CYSTOSCOPY WITH URETHRAL DILATATION: SHX5125

## 2011-11-27 HISTORY — DX: Unspecified urethral stricture, male, unspecified site: N35.919

## 2011-11-27 HISTORY — DX: Presence of coronary angioplasty implant and graft: Z95.5

## 2011-11-27 HISTORY — DX: Reserved for inherently not codable concepts without codable children: IMO0001

## 2011-11-27 HISTORY — DX: Gastro-esophageal reflux disease without esophagitis: K21.9

## 2011-11-27 HISTORY — DX: Reserved for concepts with insufficient information to code with codable children: IMO0002

## 2011-11-27 HISTORY — DX: Acute myocardial infarction, unspecified: I21.9

## 2011-11-27 HISTORY — DX: Polyneuropathy, unspecified: G62.9

## 2011-11-27 HISTORY — DX: Other specified postprocedural states: Z98.890

## 2011-11-27 HISTORY — DX: Unspecified hearing loss, unspecified ear: H91.90

## 2011-11-27 HISTORY — DX: Insomnia, unspecified: G47.00

## 2011-11-27 HISTORY — DX: Presence of aortocoronary bypass graft: Z95.1

## 2011-11-27 LAB — POCT I-STAT 4, (NA,K, GLUC, HGB,HCT)
Glucose, Bld: 111 mg/dL — ABNORMAL HIGH (ref 70–99)
HCT: 43 % (ref 39.0–52.0)
Potassium: 3.8 mEq/L (ref 3.5–5.1)

## 2011-11-27 SURGERY — CYSTOSCOPY, WITH URETHRAL DILATION
Anesthesia: General | Site: Urethra | Wound class: Clean Contaminated

## 2011-11-27 MED ORDER — DEXAMETHASONE SODIUM PHOSPHATE 4 MG/ML IJ SOLN
INTRAMUSCULAR | Status: DC | PRN
  Administered 2011-11-27: 8 mg via INTRAVENOUS

## 2011-11-27 MED ORDER — FENTANYL CITRATE 0.05 MG/ML IJ SOLN: 25.0000 ug | INTRAMUSCULAR | Status: DC | PRN

## 2011-11-27 MED ORDER — ONDANSETRON HCL 4 MG/2ML IJ SOLN
INTRAMUSCULAR | Status: DC | PRN
  Administered 2011-11-27: 4 mg via INTRAVENOUS

## 2011-11-27 MED ORDER — PROMETHAZINE HCL 25 MG/ML IJ SOLN: 6.2500 mg | INTRAMUSCULAR | Status: DC | PRN

## 2011-11-27 MED ORDER — STERILE WATER FOR IRRIGATION IR SOLN
Status: DC | PRN
  Administered 2011-11-27: 3000 mL

## 2011-11-27 MED ORDER — GENTAMICIN IN SALINE 1.6-0.9 MG/ML-% IV SOLN
80.0000 mg | INTRAVENOUS | Status: AC
  Administered 2011-11-27: 80 mg via INTRAVENOUS

## 2011-11-27 MED ORDER — ACETAMINOPHEN 10 MG/ML IV SOLN
1000.0000 mg | Freq: Four times a day (QID) | INTRAVENOUS | Status: DC
  Administered 2011-11-27: 1000 mg via INTRAVENOUS

## 2011-11-27 MED ORDER — IOHEXOL 350 MG/ML SOLN
INTRAVENOUS | Status: DC | PRN
  Administered 2011-11-27: 10 mL via INTRAVENOUS

## 2011-11-27 MED ORDER — LACTATED RINGERS IV SOLN
INTRAVENOUS | Status: DC
  Administered 2011-11-27 (×2): via INTRAVENOUS

## 2011-11-27 MED ORDER — FENTANYL CITRATE 0.05 MG/ML IJ SOLN
INTRAMUSCULAR | Status: DC | PRN
  Administered 2011-11-27 (×3): 50 ug via INTRAVENOUS

## 2011-11-27 MED ORDER — LIDOCAINE HCL (CARDIAC) 20 MG/ML IV SOLN
INTRAVENOUS | Status: DC | PRN
  Administered 2011-11-27: 50 mg via INTRAVENOUS

## 2011-11-27 MED ORDER — PROPOFOL 10 MG/ML IV EMUL
INTRAVENOUS | Status: DC | PRN
  Administered 2011-11-27: 200 mg via INTRAVENOUS
  Administered 2011-11-27: 150 mg via INTRAVENOUS

## 2011-11-27 MED ORDER — BELLADONNA ALKALOIDS-OPIUM 16.2-60 MG RE SUPP
RECTAL | Status: DC | PRN
  Administered 2011-11-27: 1 via RECTAL

## 2011-11-27 SURGICAL SUPPLY — 26 items
ADAPTER CATH URET PLST 4-6FR (CATHETERS) IMPLANT
ADPR CATH URET STRL DISP 4-6FR (CATHETERS)
BAG DRAIN URO-CYSTO SKYTR STRL (DRAIN) ×2 IMPLANT
BAG DRN UROCATH (DRAIN) ×1
BALLN NEPHROSTOMY (BALLOONS) ×2
BALLOON NEPHROSTOMY (BALLOONS) IMPLANT
BOOTIES KNEE HIGH SLOAN (MISCELLANEOUS) ×2 IMPLANT
CANISTER SUCT LVC 12 LTR MEDI- (MISCELLANEOUS) ×1 IMPLANT
CATH FOLEY 2W COUNCIL 20FR 5CC (CATHETERS) IMPLANT
CATH INTERMIT  6FR 70CM (CATHETERS) IMPLANT
CATH URET 5FR 28IN CONE TIP (BALLOONS)
CATH URET 5FR 28IN OPEN ENDED (CATHETERS) IMPLANT
CATH URET 5FR 70CM CONE TIP (BALLOONS) IMPLANT
CLOTH BEACON ORANGE TIMEOUT ST (SAFETY) ×2 IMPLANT
DRAPE CAMERA CLOSED 9X96 (DRAPES) ×2 IMPLANT
GLOVE BIO SURGEON STRL SZ7.5 (GLOVE) ×2 IMPLANT
GLOVE INDICATOR 6.5 STRL GRN (GLOVE) ×2 IMPLANT
GOWN STRL REIN XL XLG (GOWN DISPOSABLE) ×2 IMPLANT
GOWN XL W/COTTON TOWEL STD (GOWNS) ×2 IMPLANT
GUIDEWIRE 0.038 PTFE COATED (WIRE) IMPLANT
GUIDEWIRE ANG ZIPWIRE 038X150 (WIRE) IMPLANT
GUIDEWIRE STR DUAL SENSOR (WIRE) ×2 IMPLANT
NS IRRIG 500ML POUR BTL (IV SOLUTION) IMPLANT
PACK CYSTOSCOPY (CUSTOM PROCEDURE TRAY) ×2 IMPLANT
SYRINGE IRR TOOMEY STRL 70CC (SYRINGE) IMPLANT
WATER STERILE IRR 3000ML UROMA (IV SOLUTION) ×2 IMPLANT

## 2011-11-27 NOTE — H&P (Signed)
Active Problems Problems  1. 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 Nephrolithiasis 592.0 3. Prostate Cancer 185 4. 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 He has required recurrent dilations, and self cath, with occasional TUR strictures. He has developed urinary incontinence and has used a Cunningham clamp to remain dry.    Hypogonadism has been treated with  Axiron, with no change in his PSA.  He has now developed recurrent severe restriction of his urinary stream, with cystoscopy unable to dilate the urethra.. He is post dilation of proximal stricture/Heyman dilation of stricture/cauterization of stricture site on 05/19/11.  Patient states that he has a good flow & self-caths once a week to make sure it area stays open.   06/30/11 labs:  PSA - 3.74 and Testosterone - 739.55  03/28/10  PSA - 0.98   Past Medical History Problems  1. History of  Acute Cystitis 595.0 2. History of  Acute Urinary Retention 788.20 3. History of  Cardiac Catheter His Ablation 4. History of  Cath Stent Placement 5. History of  Gross Hematuria 599.71 6. History of  Heart Disease 429.9 7. History of  Hypertension 401.9 8. History of  Hypertension 401.9 9. History of  Hypogonadism 257.2 10. 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 History of  Cystoscopy For Urethral Stricture 2. History of  Cystoscopy For Urethral Stricture 3. History of  Cystoscopy For Urethral Stricture 4. History of  Cystoscopy For Urethral Stricture 5. History of  Cystoscopy With Insertion Of Urethral Stent 6. History of  Cystoscopy With Internal Urethrotomy, Direct Vision 7. History of   Cystoscopy With Internal Urethrotomy, Direct Vision 8. History of  Cystoscopy With Ureteroscopy Left 9. History of  Lithotripsy Left 10. History of  Neurological Surgery Carotid Endarterectomy Left 11. History of  Percutaneous Lithotomy Left 12. History of  Prostatectomy Retropubic Radical With Nerve Sparing 13. History of  Surgery Prostate Incision 14. History of  Transuret Resect Of Regrow Postop Bladder Neck Contracture  Current Meds 1. Aspirin 325 MG Oral Tablet; TAKE 0.5 TABLET Bedtime; Therapy: (Recorded:04Aug2011) to 2. Axiron 30 MG/ACT Transdermal Solution; use 2 pumps, with 1 swipe under the arm each am;  Therapy: 14Aug2012 to (Evaluate:11Jan2013); Last Rx:14Aug2012 3. Ciprofloxacin HCl 500 MG Oral Tablet; TAKE 1 TABLET BID; Therapy: 14Aug2012 to (Last  Rx:14Aug2012)  Requested for: 14Aug2012 4. Clotrimazole-Betamethasone 1-0.05 % External Cream; APPLY A THIN FILM TO AFFECTED  AREAS 3 TIMES A DAY; Therapy: 16Oct2009 to (Evaluate:22Mar2012); Last Rx:23Nov2011 5. Diazepam 5 MG Oral Tablet; TAKE 1 TABLET Bedtime; Therapy: 25May2012 to (Last  Rx:25May2012) 6. Hydrocodone-Acetaminophen 5-325 MG Oral Tablet; Therapy: (Recorded:04Aug2011) to 7. Lidocaine HCl 2 % External Gel; APPLY AS DIRECTED; Therapy: 31Mar2009 to (Last  Rx:23Nov2011) 8. Lisinopril TABS; Therapy: (Recorded:06Aug2009) to 9. Metoprolol Tartrate 100 MG Oral Tablet; Therapy: (Recorded:21Nov2007) to 10. Neurontin 100 MG Oral Capsule; Therapy: (Recorded:21Nov2007) to 11. Potassium Chloride PACK; Therapy: (Recorded:21Nov2007) to 12. PriLOSEC OTC TBEC; Therapy: (Recorded:21Nov2007) to 13. Transderm-Scop 1.5 MG Transdermal Patch 72 Hour; APPLY 1 PATCH EVERY 3 DAYS; Therapy:   14Aug2012 to (Evaluate:02Sep2012)  Requested for: 14Aug2012; Last Rx:14Aug2012 14. Trimix (PGE 40, PAP  30, PHE 0.5); USE AS DIRECTED, BUT NOT MORE THAN 3 X IN A WEEK;   Therapy: 19Jan2009 to (Last Rx:15Aug2012)  Requested for: 15Aug2012 15. Viagra 100  MG Oral Tablet; TAKE 1 TABLET As Directed; Therapy: 05Apr2012 to (Last   Rx:05Apr2012) 16. Zolpidem Tartrate 5 MG Oral Tablet; TAKE 1 TABLET Bedtime; Therapy: 10Sep2012 to (Last   Rx:10Sep2012)  Allergies Medication  1. Aleve TABS 2. Codeine Derivatives  Family History Problems  1. Family history of  Family Health Status Number Of Children 2 sons 2. Family history of  Urologic Disorder V18.7 kidney stones-mother  Social History Problems  1. Marital History - Currently Married 2. Never A Smoker 3. Occupation: [employer]  4. Alcohol Use 5. Tobacco Use V15.82  Review of Systems Genitourinary, constitutional, skin, eye, otolaryngeal, hematologic/lymphatic, cardiovascular, pulmonary, endocrine, musculoskeletal, gastrointestinal, neurological and psychiatric system(s) were reviewed and pertinent findings if present are noted.  Genitourinary: urinary frequency, feelings of urinary urgency, dysuria, incontinence, difficulty starting the urinary stream, weak urinary stream, urinary stream starts and stops, incomplete emptying of bladder and post-void dribbling, but no nocturia, no hematuria, no urethral discharge and urine is not foul-smelling.    Vitals Vital Signs [Data Includes: Last 1 Day]  12Sep2012 12:14PM  Blood Pressure: 164 / 93 Temperature: 98.2 F Heart Rate: 71  Physical Exam Genitourinary: Examination of the penis demonstrates no discharge, no masses, no adherence of the prepuce, no lesions and a normal meatus. The penis is circumcised. The scrotum is without lesions. The right epididymis is palpably normal and non-tender. The left epididymis is palpably normal and non-tender. The right testis is non-tender and without masses. The left testis is non-tender and without masses. No pain to urethral palpation.    Results/Data Urine [Data Includes: Last 1 Day]  12Sep2012  COLOR: ORANGE  Abnormal Reference Range YELLOW APPEARANCE: CLEAR  Reference Range  CLEAR SPECIFIC GRAVITY: 1.020  Reference Range 1.005-1.030 SQUAMOUS EPITHELIAL/HPF: FEW  Reference Range RARE WBC: 0-3 WBC/hpf Reference Range <4 RBC: 0-3 RBC/hpf Reference Range <4 BACTERIA: NONE SEEN  Reference Range RARE CRYSTALS: NONE SEEN  Reference Range NEG CASTS: Hyaline casts noted  Reference Range NEG Other: UNSPUN MICRO; QNS   Procedure  Procedure: Cystoscopy  Indication: Urethral Stricture.  Informed Consent: Risks, benefits, and potential adverse events were discussed and informed consent was obtained from the patient.  Premedication:. Demerol 50mg  & Phenergan 25mg .  Prep: The patient was prepped with betadine.  Anesthesia:. Local anesthesia was administered intraurethrally with 2% lidocaine jelly.  Antibiotic prophylaxis: Ciprofloxacin.  Procedure Note:  Urethral meatus:. No abnormalities.  Anterior urethra: No abnormalities.  Prostatic urethra:. There was visual obstruction of the prostatic urethra. No intravesical median lobe was visualized. A severe stricture was present in the bulbar urethra measuring 1 cm, and was dilated with the [facility] dilator.  Bladder: Visulization was clear. The ureteral orifices were in the normal anatomic position bilaterally. A systematic survey of the bladder demonstrated no bladder tumors or stones. The patient tolerated the procedure well.  Complications: None.    Assessment Assessed  1. 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 Cysto w/Dilation     Quarry manager signed by : Jethro Bolus, M.D.; Aug 06 2011  5:38PM

## 2011-11-27 NOTE — Transfer of Care (Signed)
Immediate Anesthesia Transfer of Care Note  Patient: Caleb Berry  Procedure(s) Performed:  CYSTOSCOPY WITH URETHRAL DILATATION - ROOM # 2 REQUESTED (COOK)   Patient Location: Patient transported to PACU with oxygen via face mask at 6 Liters / Min  Anesthesia Type: General  Level of Consciousness: awake and alert   Airway & Oxygen Therapy: Patient Spontanous Breathing and Patient connected to face mask oxygen Post-op Assessment: Report given to PACU RN and Post -op Vital signs reviewed and stable  Post vital signs: Reviewed and stable  Complications: No apparent anesthesia complications

## 2011-11-27 NOTE — Anesthesia Postprocedure Evaluation (Signed)
  Anesthesia Post-op Note  Patient: Caleb Berry  Procedure(s) Performed:  CYSTOSCOPY WITH URETHRAL DILATATION - ROOM # 2 REQUESTED (COOK)   Patient Location: PACU  Anesthesia Type: General  Level of Consciousness: oriented and sedated  Airway and Oxygen Therapy: Patient Spontanous Breathing  Post-op Pain: mild  Post-op Assessment: Post-op Vital signs reviewed, Patient's Cardiovascular Status Stable, Respiratory Function Stable and Patent Airway  Post-op Vital Signs: stable  Complications: No apparent anesthesia complications

## 2011-11-27 NOTE — Anesthesia Preprocedure Evaluation (Signed)
Anesthesia Evaluation  Patient identified by MRN, date of birth, ID band Patient awake    Reviewed: Allergy & Precautions, H&P , NPO status , Patient's Chart, lab work & pertinent test results, reviewed documented beta blocker date and time   Airway Mallampati: II TM Distance: >3 FB Neck ROM: Full    Dental  (+) Partial Lower and Dental Advisory Given   Pulmonary former smoker clear to auscultation        Cardiovascular hypertension, Pt. on medications + CAD, + Past MI and + Peripheral Vascular Disease Regular Normal+ Systolic Click CAD, s/p CABG 2002 PTCA w/ stent 2003, 2005 Hx MI 2006 Currently asymptomatic. Cards office visit Dec Stable May '12 heart cath, patent grafts. No intervention Carotid endarterectomy-Left, no sequlae   Neuro/Psych Negative Neurological ROS  Negative Psych ROS   GI/Hepatic negative GI ROS, Neg liver ROS,   Endo/Other  Negative Endocrine ROS  Renal/GU negative Renal ROS   Urethral stricture/BNC Hx prostate cancer    Musculoskeletal negative musculoskeletal ROS (+)   Abdominal   Peds negative pediatric ROS (+)  Hematology negative hematology ROS (+)   Anesthesia Other Findings Front upper Caps  Reproductive/Obstetrics negative OB ROS                           Anesthesia Physical Anesthesia Plan  ASA: III  Anesthesia Plan: General   Post-op Pain Management:    Induction: Intravenous  Airway Management Planned: LMA  Additional Equipment:   Intra-op Plan:   Post-operative Plan: Extubation in OR  Informed Consent: I have reviewed the patients History and Physical, chart, labs and discussed the procedure including the risks, benefits and alternatives for the proposed anesthesia with the patient or authorized representative who has indicated his/her understanding and acceptance.     Plan Discussed with: CRNA and Surgeon  Anesthesia Plan Comments:          Anesthesia Quick Evaluation

## 2011-11-27 NOTE — Op Note (Signed)
Pre-operative diagnosis : Recurrent urethral stricture disease  Postoperative diagnosis same  Operation: Cystoscopy ureteroscopy, Cook balloon urethral dilation of urethral stricture disease  Surgeon:  S. Patsi Sears, MD  Anesthesia:  Gen. LMA  Preparation: After appropriate preanesthesia, the patient was brought to the operating room, and placed upon the operating table in the dorsal supine position, where general LMA anesthesia was introduced. He was then replaced in the dorsal lithotomy position where the pubis was prepped with Betadine solution and draped in usual fashion.  Review history: The patient is a 71 year old male, status post radical retropubic prostatectomy for Gleason 9 prostate cancer in 1999, with urethral stricture following traumatic Foley catheterization for carotid surgery. The patient had proximal urethral stent placement, and this had growth of scar tissue through the stent. He has a distal urethral stricture disease as well. He has urinary incontinence treated with self intermittent catheterization. He is not able to catheterize at this time because of stricture disease.  Statement of  Likelihood of Success: Excellent. TIME-OUT observed.:  Procedure: Cystourethroscopy is accomplished, and shows some distal urethral stricture disease. However, unable to pass the 22 cystoscope through the distal urethral strictured area, to the level of the proximal ureteral stricture. Guidewire is passed through the strictured area, and five-minute Cook balloon dilations accomplished to 18 atmospheres pressure. Following this, the [facility] balloon was deflated and removed, and repeat cystoscopy is accomplished and showed good dilation of the proximal stricture. There is some growth of tissue through the intraluminal windows of the UroLume stent. No bleeding is noted. Photo documentation is accomplished.  The patient was awakened and taken to recovery room in good condition.

## 2011-11-27 NOTE — Anesthesia Procedure Notes (Addendum)
Procedure Name: LMA Insertion Performed by: Lorrin Jackson Pre-anesthesia Checklist: Patient identified, Emergency Drugs available, Suction available and Patient being monitored Patient Re-evaluated:Patient Re-evaluated prior to inductionOxygen Delivery Method: Circle System Utilized Preoxygenation: Pre-oxygenation with 100% oxygen Intubation Type: IV induction Ventilation: Mask ventilation without difficulty LMA: LMA with gastric port inserted LMA Size: 4.0 Number of attempts: 1 Placement Confirmation: positive ETCO2 Tube secured with: Tape Dental Injury: Teeth and Oropharynx as per pre-operative assessment

## 2011-11-27 NOTE — Interval H&P Note (Signed)
History and Physical Interval Note:  11/27/2011 9:02 AM  Caleb Berry  has presented today for surgery, with the diagnosis of Urethral Stricture  The various methods of treatment have been discussed with the patient and family. After consideration of risks, benefits and other options for treatment, the patient has consented to  Procedure(s): CYSTOSCOPY WITH URETHRAL DILATATION as a surgical intervention .  The patients' history has been reviewed, patient examined, no change in status, stable for surgery.  I have reviewed the patients' chart and labs.  Questions were answered to the patient's satisfaction.     Jethro Bolus I

## 2011-11-28 ENCOUNTER — Encounter (HOSPITAL_BASED_OUTPATIENT_CLINIC_OR_DEPARTMENT_OTHER): Payer: Self-pay | Admitting: Urology

## 2011-11-28 NOTE — Anesthesia Postprocedure Evaluation (Signed)
  Anesthesia Post-op Note  Patient: Caleb Berry  Procedure(s) Performed:  CYSTOSCOPY WITH URETHRAL DILATATION - ROOM # 2 REQUESTED (COOK)   Patient Location: PACU  Anesthesia Type: General  Level of Consciousness: awake and oriented  Airway and Oxygen Therapy: Patient Spontanous Breathing  Post-op Pain: none  Post-op Assessment: Post-op Vital signs reviewed, Patient's Cardiovascular Status Stable, Respiratory Function Stable and Patent Airway  Post-op Vital Signs: stable  Complications: No apparent anesthesia complications

## 2011-12-01 NOTE — H&P (Signed)
Active Problems  Problems  1. 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 Nephrolithiasis 592.0  3. Prostate Cancer 185  4. 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 He has required recurrent dilations, and self cath, with occasional TUR strictures. He has developed urinary incontinence and has used a Cunningham clamp to remain dry.  Hypogonadism has been treated with Axiron, with no change in his PSA.  He has now developed recurrent severe restriction of his urinary stream, with cystoscopy unable to dilate the urethra.. He is post dilation of proximal stricture/Heyman dilation of stricture/cauterization of stricture site on 05/19/11. Patient states that he has a good flow & self-caths once a week to make sure it area stays open.  06/30/11 labs: PSA - 3.74 and Testosterone - 739.55  03/28/10 PSA - 0.98  Past Medical History  Problems  1. History of Acute Cystitis 595.0  2. History of Acute Urinary Retention 788.20  3. History of Cardiac Catheter His Ablation  4. History of Cath Stent Placement  5. History of Gross Hematuria 599.71  6. History of Heart Disease 429.9  7. History of Hypertension 401.9  8. History of Hypertension 401.9  9. History of Hypogonadism 257.2  10. 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 History of Cystoscopy For Urethral Stricture  2. History of Cystoscopy For Urethral Stricture  3. History of Cystoscopy For Urethral Stricture  4. History of Cystoscopy For Urethral Stricture  5. History of Cystoscopy With Insertion Of Urethral Stent  6. History of Cystoscopy With Internal Urethrotomy, Direct Vision  7. History of Cystoscopy With Internal  Urethrotomy, Direct Vision  8. History of Cystoscopy With Ureteroscopy Left  9. History of Lithotripsy Left  10. History of Neurological Surgery Carotid Endarterectomy Left  11. History of Percutaneous Lithotomy Left  12. History of Prostatectomy Retropubic Radical With Nerve Sparing  13. History of Surgery Prostate Incision  14. History of Transuret Resect Of Regrow Postop Bladder Neck Contracture  Current Meds  1. Aspirin 325 MG Oral Tablet; TAKE 0.5 TABLET Bedtime; Therapy: (Recorded:04Aug2011) to  2. Axiron 30 MG/ACT Transdermal Solution; use 2 pumps, with 1 swipe under the arm each am;  Therapy: 14Aug2012 to (Evaluate:11Jan2013); Last Rx:14Aug2012  3. Ciprofloxacin HCl 500 MG Oral Tablet; TAKE 1 TABLET BID; Therapy: 14Aug2012 to (Last  Rx:14Aug2012) Requested for: 14Aug2012  4. Clotrimazole-Betamethasone 1-0.05 % External Cream; APPLY A THIN FILM TO AFFECTED  AREAS 3 TIMES A DAY; Therapy: 16Oct2009 to (Evaluate:22Mar2012); Last Rx:23Nov2011  5. Diazepam 5 MG Oral Tablet; TAKE 1 TABLET Bedtime; Therapy: 25May2012 to (Last  Rx:25May2012)  6. Hydrocodone-Acetaminophen 5-325 MG Oral Tablet; Therapy: (Recorded:04Aug2011) to  7. Lidocaine HCl 2 % External Gel; APPLY AS DIRECTED; Therapy: 31Mar2009 to (Last  Rx:23Nov2011)  8. Lisinopril TABS; Therapy: (Recorded:06Aug2009) to  9. Metoprolol Tartrate 100 MG Oral Tablet; Therapy: (Recorded:21Nov2007) to  10. Neurontin 100 MG Oral Capsule; Therapy: (Recorded:21Nov2007) to  11. Potassium Chloride PACK; Therapy: (Recorded:21Nov2007) to  12. PriLOSEC OTC TBEC; Therapy: (Recorded:21Nov2007) to  13. Transderm-Scop 1.5 MG Transdermal Patch 72 Hour; APPLY 1 PATCH EVERY 3 DAYS; Therapy:  14Aug2012 to (Evaluate:02Sep2012) Requested for: 14Aug2012; Last Rx:14Aug2012  14. Trimix (PGE 40, PAP 30, PHE  0.5); USE AS DIRECTED, BUT NOT MORE THAN 3 X IN A WEEK;  Therapy: 19Jan2009 to (Last Rx:15Aug2012) Requested for: 15Aug2012  15. Viagra 100 MG Oral Tablet;  TAKE 1 TABLET As Directed; Therapy: 05Apr2012 to (Last  Rx:05Apr2012)  16. Zolpidem Tartrate 5 MG Oral Tablet; TAKE 1 TABLET Bedtime; Therapy: 10Sep2012 to (Last  Rx:10Sep2012)  Allergies  Medication  1. Aleve TABS  2. Codeine Derivatives  Family History  Problems  1. Family history of Family Health Status Number Of Children  2 sons  2. Family history of Urologic Disorder V18.7  kidney stones-mother  Social History  Problems  1. Marital History - Currently Married  2. Never A Smoker  3. Occupation:  [employer]  4. Alcohol Use  5. Tobacco Use V15.82  Review of Systems  Genitourinary, constitutional, skin, eye, otolaryngeal, hematologic/lymphatic, cardiovascular, pulmonary, endocrine, musculoskeletal, gastrointestinal, neurological and psychiatric system(s) were reviewed and pertinent findings if present are noted.  Genitourinary: urinary frequency, feelings of urinary urgency, dysuria, incontinence, difficulty starting the urinary stream, weak urinary stream, urinary stream starts and stops, incomplete emptying of bladder and post-void dribbling, but no nocturia, no hematuria, no urethral discharge and urine is not foul-smelling.  Vitals  Vital Signs [Data Includes: Last 1 Day]  12Sep2012 12:14PM  Blood Pressure: 164 / 93  Temperature: 98.2 F  Heart Rate: 71  Physical Exam  Genitourinary: Examination of the penis demonstrates no discharge, no masses, no adherence of the prepuce, no lesions and a normal meatus. The penis is circumcised. The scrotum is without lesions. The right epididymis is palpably normal and non-tender. The left epididymis is palpably normal and non-tender. The right testis is non-tender and without masses. The left testis is non-tender and without masses. No pain to urethral palpation.  Results/Data  Urine [Data Includes: Last 1 Day]  12Sep2012  COLOR: ORANGE Abnormal Reference Range YELLOW  APPEARANCE: CLEAR Reference Range CLEAR  SPECIFIC  GRAVITY: 1.020 Reference Range 1.005-1.030  SQUAMOUS EPITHELIAL/HPF: FEW Reference Range RARE  WBC: 0-3 WBC/hpf Reference Range <4  RBC: 0-3 RBC/hpf Reference Range <4  BACTERIA: NONE SEEN Reference Range RARE  CRYSTALS: NONE SEEN Reference Range NEG  CASTS: Hyaline casts noted Reference Range NEG  Other: UNSPUN MICRO; QNS  Procedure  Procedure: Cystoscopy  Indication: Urethral Stricture.  Informed Consent: Risks, benefits, and potential adverse events were discussed and informed consent was obtained from the patient.  Premedication:. Demerol 50mg  & Phenergan 25mg .  Prep: The patient was prepped with betadine.  Anesthesia:. Local anesthesia was administered intraurethrally with 2% lidocaine jelly.  Antibiotic prophylaxis: Ciprofloxacin.  Procedure Note:  Urethral meatus:. No abnormalities.  Anterior urethra: No abnormalities.  Prostatic urethra:. There was visual obstruction of the prostatic urethra. No intravesical median lobe was visualized. A severe stricture was present in the bulbar urethra measuring 1 cm, and was dilated with the [facility] dilator.  Bladder: Visulization was clear. The ureteral orifices were in the normal anatomic position bilaterally. A systematic survey of the bladder demonstrated no bladder tumors or stones. The patient tolerated the procedure well.  Complications: None.  Assessment  Assessed  1. 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 Cysto w/Dilation

## 2012-02-14 ENCOUNTER — Encounter (HOSPITAL_COMMUNITY): Payer: Self-pay | Admitting: Emergency Medicine

## 2012-02-14 ENCOUNTER — Emergency Department (HOSPITAL_COMMUNITY)
Admission: EM | Admit: 2012-02-14 | Discharge: 2012-02-14 | Disposition: A | Discharge status: Home / Self Care | Payer: Medicare Other | Attending: Urology | Admitting: Urology

## 2012-02-14 DIAGNOSIS — K219 Gastro-esophageal reflux disease without esophagitis: Secondary | ICD-10-CM | POA: Insufficient documentation

## 2012-02-14 DIAGNOSIS — N501 Vascular disorders of male genital organs: Secondary | ICD-10-CM | POA: Insufficient documentation

## 2012-02-14 DIAGNOSIS — I2510 Atherosclerotic heart disease of native coronary artery without angina pectoris: Secondary | ICD-10-CM | POA: Insufficient documentation

## 2012-02-14 DIAGNOSIS — R10819 Abdominal tenderness, unspecified site: Secondary | ICD-10-CM | POA: Insufficient documentation

## 2012-02-14 DIAGNOSIS — I10 Essential (primary) hypertension: Secondary | ICD-10-CM | POA: Insufficient documentation

## 2012-02-14 DIAGNOSIS — I252 Old myocardial infarction: Secondary | ICD-10-CM | POA: Insufficient documentation

## 2012-02-14 DIAGNOSIS — Z951 Presence of aortocoronary bypass graft: Secondary | ICD-10-CM | POA: Insufficient documentation

## 2012-02-14 DIAGNOSIS — Z79899 Other long term (current) drug therapy: Secondary | ICD-10-CM | POA: Insufficient documentation

## 2012-02-14 DIAGNOSIS — R339 Retention of urine, unspecified: Secondary | ICD-10-CM | POA: Insufficient documentation

## 2012-02-14 DIAGNOSIS — Z7982 Long term (current) use of aspirin: Secondary | ICD-10-CM | POA: Insufficient documentation

## 2012-02-14 DIAGNOSIS — IMO0002 Reserved for concepts with insufficient information to code with codable children: Secondary | ICD-10-CM | POA: Insufficient documentation

## 2012-02-14 DIAGNOSIS — R109 Unspecified abdominal pain: Secondary | ICD-10-CM | POA: Insufficient documentation

## 2012-02-14 DIAGNOSIS — Z8546 Personal history of malignant neoplasm of prostate: Secondary | ICD-10-CM | POA: Insufficient documentation

## 2012-02-14 LAB — BASIC METABOLIC PANEL
Calcium: 9.7 mg/dL (ref 8.4–10.5)
GFR calc Af Amer: 60 mL/min — ABNORMAL LOW (ref >90)
GFR calc non Af Amer: 52 mL/min — ABNORMAL LOW (ref >90)
Potassium: 3.7 mEq/L (ref 3.5–5.1)
Sodium: 134 mEq/L — ABNORMAL LOW (ref 135–145)

## 2012-02-14 LAB — DIFFERENTIAL
Basophils Absolute: 0.1 10*3/uL (ref 0.0–0.1)
Basophils Relative: 1 % (ref 0–1)
Eosinophils Absolute: 0.1 10*3/uL (ref 0.0–0.7)
Eosinophils Relative: 1 % (ref 0–5)
Neutrophils Relative %: 74 % (ref 43–77)

## 2012-02-14 LAB — CBC
MCH: 22.6 pg — ABNORMAL LOW (ref 26.0–34.0)
MCHC: 31 g/dL (ref 30.0–36.0)
MCV: 73 fL — ABNORMAL LOW (ref 78.0–100.0)
Platelets: 459 10*3/uL — ABNORMAL HIGH (ref 150–400)
RBC: 5.74 MIL/uL (ref 4.22–5.81)
RDW: 16.4 % — ABNORMAL HIGH (ref 11.5–15.5)

## 2012-02-14 MED ORDER — LORAZEPAM 2 MG/ML IJ SOLN
INTRAMUSCULAR | Status: AC
  Administered 2012-02-14: 1 mg
  Filled 2012-02-14: qty 1

## 2012-02-14 MED ORDER — ONDANSETRON HCL 4 MG/2ML IJ SOLN
4.0000 mg | Freq: Once | INTRAMUSCULAR | Status: AC
  Administered 2012-02-14: 4 mg via INTRAVENOUS
  Filled 2012-02-14: qty 2

## 2012-02-14 MED ORDER — SODIUM CHLORIDE 0.9 % IV SOLN
INTRAVENOUS | Status: DC
  Administered 2012-02-14: 23:00:00 via INTRAVENOUS

## 2012-02-14 MED ORDER — SODIUM CHLORIDE 0.9 % IV BOLUS (SEPSIS)
500.0000 mL | Freq: Once | INTRAVENOUS | Status: AC
  Administered 2012-02-14: 500 mL via INTRAVENOUS

## 2012-02-14 MED ORDER — LIDOCAINE HCL 2 % EX GEL
CUTANEOUS | Status: AC
  Administered 2012-02-14: 23:00:00
  Filled 2012-02-14: qty 10

## 2012-02-14 MED ORDER — GENTAMICIN IN SALINE 1.6-0.9 MG/ML-% IV SOLN
80.0000 mg | INTRAVENOUS | Status: AC
  Administered 2012-02-14: 80 mg via INTRAVENOUS
  Filled 2012-02-14: qty 50

## 2012-02-14 MED ORDER — HYDROMORPHONE HCL PF 1 MG/ML IJ SOLN
1.0000 mg | Freq: Once | INTRAMUSCULAR | Status: AC
  Administered 2012-02-14: 2 mg via INTRAVENOUS
  Filled 2012-02-14 (×2): qty 1

## 2012-02-14 NOTE — Consult Note (Signed)
Urology Consult   Physician requesting consult: Adriana Simas  Reason for consult: Urinary retention  History of Present Illness: Caleb Berry is a 71 y.o. male with a history of prostate cancer, status post radical prostatectomy and salvage radiotherapy. He has a urethral stent for a stricture. The stent produces obstruction, and quite often has to have anesthetic dilatation/resection. The patient called earlier today with catheter related bleeding following self-catheterization. He was able to get a 14 French red rubber catheter through the stent, but after that was removed, he has been in retention and has been unable to catheterize. He presents to the emergency room significant pain. Dr. Adriana Simas evaluated the patient, and has requested urologic consultation.  The patient follows up with Dr. Patsi Sears a regular basis.    Past Medical History  Diagnosis Date  . HTN (hypertension)   . History of prostate cancer     S/P RADICAL PROSTATECTOMY  . Nephrolithiasis   . Stricture of urethra SELF-CATH AS NEEDED    S/P MULTIPLE DILATION'S   . Bladder neck contracture     POST TUI AND UROLUM STENTS  . Status post carotid endarterectomy LEFT  . Myocardial infarction 2006    S/P PTCA W/ X2 DE STENTS  . Status post primary angioplasty with coronary stent 2006-- MI    X2 DRUG-ELUTING STENTS  . CAD (coronary artery disease) CARDIOLOGIST- DR Riley Kill- VISIT NOTE 09-08-11 IN EPIC    LAST STRESS TEST 2006 ABNORMAL  . DDD (degenerative disc disease)   . History of gout PT STATES STABLE  . GERD (gastroesophageal reflux disease)     CONTROLLED W/ PRILOSEC AND ZANTAC  . Frequency of urination   . Urgency of urination   . Incontinence of urine   . Impaired hearing BILATERAL AIDS  . Insomnia   . S/P coronary artery bypass graft x 3   . Peripheral neuropathy FEET    Past Surgical History  Procedure Date  . Cysto w/ balloon dilation urethral stricture 05-19-2011  . Transurethral vaporization bladder neck  contracture / dilatation urethral stricture 12-26-2010  . Coronary artery bypass graft 2000    X3  . Prostatectomy 1996  . Cystoscopy w/ internal urethrotomy 2003      X2--   W/ UROLUME PROSTHESIS  . Percutaneous nephrotomy 1989  . Extracorporeal shock wave lithotripsy X5  . Urethral dilatation's MULTIPLE   . Coronary angioplasty 2006    X2 DRUG-ELUTING STENTS  . Cardiac catheterization 12-18-2010 AND 2008  . Carotid endarterectomy 2002    LEFT  . Cystoscopy with urethral dilatation 11/27/2011    Procedure: CYSTOSCOPY WITH URETHRAL DILATATION;  Surgeon: Kathi Ludwig, MD;  Location: [facility];  Service: Urology;  Laterality: N/A;  ROOM # 2 REQUESTED (COOK)     Medications: Scheduled Meds:   . gentamicin  80 mg Intravenous Once  .  HYDROmorphone (DILAUDID) injection  1 mg Intravenous Once  . lidocaine      . LORazepam      . ondansetron  4 mg Intravenous Once  . sodium chloride  500 mL Intravenous Once   Continuous Infusions:   . sodium chloride     PRN Meds:.  Allergies:  Allergies  Allergen Reactions  . Aleve Other (See Comments)    CHEST TIGHTNESS  . Codeine Other (See Comments)    SEVERE HEADACHES IF TAKEN ON A DAILY BASIS  . Statins Other (See Comments)    SEVERE MUSCLE ACHES/ JOINT PAIN    Family History  Problem  Relation Age of Onset  . Hypertension      Social History:  reports that he quit smoking about 43 years ago. His smoking use included Cigarettes. He has never used smokeless tobacco. He reports that he does not drink alcohol or use illicit drugs.  ROS: A complete review of systems was performed.  All systems are negative except for pertinent findings as noted.  Physical Exam:  Vital signs in last 24 hours: Pulse Rate:  [85-109] 108  (03/23 2000) Resp:  [20] 20  (03/23 1816) BP: (131-180)/(77-114) 158/81 mmHg (03/23 2000) SpO2:  [96 %-99 %] 99 % (03/23 2000) General:  Alert and oriented, in significant  distress  Abdomen was firm with suprapubic tenderness. No rebound or guarding was noted. Phallus was uncircumcised. There was blood at the meatus. Testicles cord epididymal structures were normal.  Rectal exam was deferred. Extremities: No edema Neurologic: Grossly intact  Laboratory Data:   [facility] 02/14/12 1950  WBC 13.4*  HGB 13.0  HCT 41.9  PLT 459*     Basename 02/14/12 1950  NA 134*  K 3.7  CL 96  GLUCOSE 104*  BUN 17  CALCIUM 9.7  CREATININE 1.35     Results for orders placed during the hospital encounter of 02/14/12 (from the past 24 hour(s))  CBC     Status: Abnormal   Collection Time   02/14/12  7:50 PM      Component Value Range   WBC 13.4 (*) 4.0 - 10.5 (K/uL)   RBC 5.74  4.22 - 5.81 (MIL/uL)   Hemoglobin 13.0  13.0 - 17.0 (g/dL)   HCT 40.9  81.1 - 91.4 (%)   MCV 73.0 (*) 78.0 - 100.0 (fL)   MCH 22.6 (*) 26.0 - 34.0 (pg)   MCHC 31.0  30.0 - 36.0 (g/dL)   RDW 78.2 (*) 95.6 - 15.5 (%)   Platelets 459 (*) 150 - 400 (K/uL)  DIFFERENTIAL     Status: Abnormal   Collection Time   02/14/12  7:50 PM      Component Value Range   Neutrophils Relative 74  43 - 77 (%)   Neutro Abs 9.9 (*) 1.7 - 7.7 (K/uL)   Lymphocytes Relative 18  12 - 46 (%)   Lymphs Abs 2.4  0.7 - 4.0 (K/uL)   Monocytes Relative 7  3 - 12 (%)   Monocytes Absolute 0.9  0.1 - 1.0 (K/uL)   Eosinophils Relative 1  0 - 5 (%)   Eosinophils Absolute 0.1  0.0 - 0.7 (K/uL)   Basophils Relative 1  0 - 1 (%)   Basophils Absolute 0.1  0.0 - 0.1 (K/uL)  BASIC METABOLIC PANEL     Status: Abnormal   Collection Time   02/14/12  7:50 PM      Component Value Range   Sodium 134 (*) 135 - 145 (mEq/L)   Potassium 3.7  3.5 - 5.1 (mEq/L)   Chloride 96  96 - 112 (mEq/L)   CO2 25  19 - 32 (mEq/L)   Glucose, Bld 104 (*) 70 - 99 (mg/dL)   BUN 17  6 - 23 (mg/dL)   Creatinine, Ser 2.13  0.50 - 1.35 (mg/dL)   Calcium 9.7  8.4 - 08.6 (mg/dL)   GFR calc non Af Amer 52 (*) >90 (mL/min)   GFR calc Af Amer 60 (*)  >90 (mL/min)   No results found for this or any previous visit (from the past 240 hour(s)).  Renal Function:  [employer]  02/14/12 1950  CREATININE 1.35   The CrCl is unknown because both a height and weight (above a minimum accepted value) are required for this calculation.  Radiologic Imaging: No results found.  I independently reviewed the above imaging studies. I attempted to place an 25 [place] coud-tip catheter after sterile prep and drape, and instilling 30 cc of 2% lidocaine jelly and the patient's urethra. This was unsuccessful. I then passed a flexible cystoscope. There was a mild degree of trauma at the distal end of the ureteral stent. I was able to navigate the scope up to the stent, and then placed a guidewire through the cystoscope and through the stent/stricture. I then dilated him to 20 [place] with [employer]. I then was able to finally navigate a 14 French Foley catheter over top the guidewire. A 16 French coud-tip catheter would not pass. Clear urine with peridium stained was produced. Impression/Assessment:  1. Urethral stricture, with ureteral stent in place, recurrent. Dilated today.  2. History of prostate cancer, with a persistent PSA, last checked approximately 8 months ago.  3. Urinary retention, treated with ureteral dilation and catheter placement. Plan:  1. The patient received 80 mg of gentamicin  2. He will call the office early in the week for followup with Dr. Patsi Sears and possible scheduling of repeat anesthetic cystoscopy and stricture treatment.  Chelsea Aus 02/14/2012, 8:54 PM    Bertram Millard. Stein Windhorst MD

## 2012-02-14 NOTE — Discharge Instructions (Signed)
Discharge instructions your urologist.  Return if worse in any way

## 2012-02-14 NOTE — ED Provider Notes (Addendum)
History     CSN: [id number]  Arrival date & time 02/14/12  1811   First MD Initiated Contact with Patient 02/14/12 1828      Chief Complaint  Patient presents with  . Male GU Problem    cannot catheterize    (Consider location/radiation/quality/duration/timing/severity/associated sxs/prior treatment) HPI:  Level V caveat for urgent need for intervention. Patient has had long-standing urethral strictures leading to urethral stents.  He self caths at home.  Today he noticed urinary retention problem.  He was able to self cath with a 12 Foley followed by a 14 Foley;  however a 16 Foley kinked in his urethra.  He is having suprapubic pain and bleeding.  He has spoken to the urologist on call was recommended that he come to the emergency department.   Past Medical History  Diagnosis Date  . HTN (hypertension)   . History of prostate cancer     S/P RADICAL PROSTATECTOMY  . Nephrolithiasis   . Stricture of urethra SELF-CATH AS NEEDED    S/P MULTIPLE DILATION'S   . Bladder neck contracture     POST TUI AND UROLUM STENTS  . Status post carotid endarterectomy LEFT  . Myocardial infarction 2006    S/P PTCA W/ X2 DE STENTS  . Status post primary angioplasty with coronary stent 2006-- MI    X2 DRUG-ELUTING STENTS  . CAD (coronary artery disease) CARDIOLOGIST- DR Riley Kill- VISIT NOTE 09-08-11 IN EPIC    LAST STRESS TEST 2006 ABNORMAL  . DDD (degenerative disc disease)   . History of gout PT STATES STABLE  . GERD (gastroesophageal reflux disease)     CONTROLLED W/ PRILOSEC AND ZANTAC  . Frequency of urination   . Urgency of urination   . Incontinence of urine   . Impaired hearing BILATERAL AIDS  . Insomnia   . S/P coronary artery bypass graft x 3   . Peripheral neuropathy FEET    Past Surgical History  Procedure Date  . Cysto w/ balloon dilation urethral stricture 05-19-2011  . Transurethral vaporization bladder neck contracture / dilatation urethral stricture 12-26-2010  .  Coronary artery bypass graft 2000    X3  . Prostatectomy 1996  . Cystoscopy w/ internal urethrotomy 2003      X2--   W/ UROLUME PROSTHESIS  . Percutaneous nephrotomy 1989  . Extracorporeal shock wave lithotripsy X5  . Urethral dilatation's MULTIPLE   . Coronary angioplasty 2006    X2 DRUG-ELUTING STENTS  . Cardiac catheterization 12-18-2010 AND 2008  . Carotid endarterectomy 2002    LEFT  . Cystoscopy with urethral dilatation 11/27/2011    Procedure: CYSTOSCOPY WITH URETHRAL DILATATION;  Surgeon: Kathi Ludwig, MD;  Location: [facility];  Service: Urology;  Laterality: N/A;  ROOM # 2 REQUESTED (Karnell Vanderloop)     Family History  Problem Relation Age of Onset  . Hypertension      History  Substance Use Topics  . Smoking status: Former Smoker    Types: Cigarettes    Quit date: 11/24/1968  . Smokeless tobacco: Never Used  . Alcohol Use: No      Review of Systems  Unable to perform ROS: Other    Allergies  Aleve; Codeine; and Statins  Home Medications   Current Outpatient Rx  Name Route Sig Dispense Refill  . ALLOPURINOL 100 MG PO TABS Oral Take 100 mg by mouth daily.    . ASPIRIN 81 MG PO TABS Oral Take 81 mg by mouth daily.     Marland Kitchen  VITAMIN B COMPLEX PO Oral Take 1 capsule by mouth daily.    Marland Kitchen CETIRIZINE HCL 10 MG PO TABS Oral Take 10 mg by mouth as needed.     Marland Kitchen GABAPENTIN 100 MG PO CAPS Oral Take 100 mg by mouth 5 (five) times daily. 1 tab in the morning, 1 tab at lunch, 1 tab in the evening, and 2 tabs at bedtime    . HYDROCHLOROTHIAZIDE 25 MG PO TABS Oral Take 12.5 mg by mouth 2 (two) times daily. Take one-half by mouth twice a day    . HYDROCODONE-ACETAMINOPHEN 5-500 MG PO TABS Oral Take 1 tablet by mouth every 6 (six) hours as needed.     Marland Kitchen LISINOPRIL 2.5 MG PO TABS Oral Take 1 tablet by mouth daily.    Marland Kitchen MAGNESIUM OXIDE 400 MG PO TABS Oral Take 1 tablet (400 mg total) by mouth daily.    Marland Kitchen METOPROLOL TARTRATE 50 MG PO TABS Oral Take 1 tablet (50 mg  total) by mouth 2 (two) times daily. 14 tablet 1  . NITROGLYCERIN 0.4 MG SL SUBL Sublingual Place 1 tablet (0.4 mg total) under the tongue every 5 (five) minutes as needed for chest pain. 25 tablet 4  . OMEPRAZOLE 20 MG PO CPDR Oral Take 20 mg by mouth every morning.      Marland Kitchen OVER THE COUNTER MEDICATION Oral Take 1 capsule by mouth daily. Green miracle    . POTASSIUM CHLORIDE CRYS ER 20 MEQ PO TBCR Oral Take 0.5 tablets by mouth Twice daily.    Marland Kitchen RANITIDINE HCL 150 MG PO CAPS Oral Take 150 mg by mouth every evening.     Marland Kitchen ZOLPIDEM TARTRATE 10 MG PO TABS Oral Take 10 mg by mouth at bedtime as needed.      BP 180/88  Pulse 93  Resp 20  SpO2 98%  Physical Exam  Nursing note and vitals reviewed. Constitutional: He is oriented to person, place, and time. He appears well-developed and well-nourished.  HENT:  Head: Normocephalic and atraumatic.  Eyes: Conjunctivae and EOM are normal. Pupils are equal, round, and reactive to light.  Neck: Normal range of motion. Neck supple.  Cardiovascular: Normal rate and regular rhythm.   Pulmonary/Chest: Effort normal and breath sounds normal.  Abdominal: Soft. Bowel sounds are normal.  Genitourinary:       Slightly tender in suprapubic area. There is minimal bleeding at the tip of the urethra  Musculoskeletal: Normal range of motion.  Neurological: He is alert and oriented to person, place, and time.  Skin: Skin is warm and dry.  Psychiatric: He has a normal mood and affect.    ED Course  Procedures (including critical care time)   Labs Reviewed  CBC  DIFFERENTIAL  BASIC METABOLIC PANEL   No results found.   No diagnosis found.    MDM  I discussed the case with the urologist on call. He'll come and see the patient. Intravenous pain medicine given.        Donnetta Hutching, MD 02/14/12 1943  Donnetta Hutching, MD 02/14/12 2239

## 2012-02-14 NOTE — ED Notes (Signed)
Dr Retta Diones (urologist) at bedside with pt,  Urology cart at bedside from OR per his request.

## 2012-02-14 NOTE — ED Notes (Signed)
Per Pt: history of self catheterization with strictures/stent with numerous revisions by MD Tannenbaum. Pt was able to catheterize 30 minutes ago, but "got a lot of blood." Pt states "I won't let anyone cath me except myself or Dr. Patsi Sears."

## 2012-02-16 ENCOUNTER — Other Ambulatory Visit: Payer: Self-pay | Admitting: Urology

## 2012-02-17 ENCOUNTER — Encounter (HOSPITAL_BASED_OUTPATIENT_CLINIC_OR_DEPARTMENT_OTHER): Payer: Self-pay | Admitting: *Deleted

## 2012-02-17 NOTE — Progress Notes (Signed)
NPO AFTER MN. ARRIVES AT 0945. CURRENT LABS 02-14-12 IN EPIC ALSO EKG AND CXR. WILL TAKE METOPROLOL AND PRILOSEC AM OF SURG.  W/ SIP OF WATER.

## 2012-02-26 ENCOUNTER — Ambulatory Visit (HOSPITAL_BASED_OUTPATIENT_CLINIC_OR_DEPARTMENT_OTHER)
Admission: RE | Admit: 2012-02-26 | Discharge: 2012-02-26 | Disposition: A | Discharge status: Home / Self Care | Payer: Medicare Other | Source: Ambulatory Visit | Attending: Urology | Admitting: Urology

## 2012-02-26 ENCOUNTER — Encounter (HOSPITAL_BASED_OUTPATIENT_CLINIC_OR_DEPARTMENT_OTHER): Payer: Self-pay | Admitting: Anesthesiology

## 2012-02-26 ENCOUNTER — Ambulatory Visit (HOSPITAL_BASED_OUTPATIENT_CLINIC_OR_DEPARTMENT_OTHER): Payer: Medicare Other | Admitting: Anesthesiology

## 2012-02-26 ENCOUNTER — Encounter (HOSPITAL_BASED_OUTPATIENT_CLINIC_OR_DEPARTMENT_OTHER): Payer: Self-pay | Admitting: *Deleted

## 2012-02-26 ENCOUNTER — Encounter (HOSPITAL_BASED_OUTPATIENT_CLINIC_OR_DEPARTMENT_OTHER)
Admission: RE | Disposition: A | Discharge status: Home / Self Care | Payer: Self-pay | Source: Ambulatory Visit | Attending: Urology

## 2012-02-26 DIAGNOSIS — Z79899 Other long term (current) drug therapy: Secondary | ICD-10-CM | POA: Insufficient documentation

## 2012-02-26 DIAGNOSIS — N35919 Unspecified urethral stricture, male, unspecified site: Secondary | ICD-10-CM | POA: Insufficient documentation

## 2012-02-26 DIAGNOSIS — N529 Male erectile dysfunction, unspecified: Secondary | ICD-10-CM | POA: Insufficient documentation

## 2012-02-26 DIAGNOSIS — I10 Essential (primary) hypertension: Secondary | ICD-10-CM | POA: Insufficient documentation

## 2012-02-26 DIAGNOSIS — N3000 Acute cystitis without hematuria: Secondary | ICD-10-CM | POA: Insufficient documentation

## 2012-02-26 DIAGNOSIS — C61 Malignant neoplasm of prostate: Secondary | ICD-10-CM | POA: Insufficient documentation

## 2012-02-26 DIAGNOSIS — R32 Unspecified urinary incontinence: Secondary | ICD-10-CM | POA: Insufficient documentation

## 2012-02-26 DIAGNOSIS — Z7982 Long term (current) use of aspirin: Secondary | ICD-10-CM | POA: Insufficient documentation

## 2012-02-26 HISTORY — PX: CYSTOSCOPY WITH URETHRAL DILATATION: SHX5125

## 2012-02-26 HISTORY — PX: TRANSURETHRAL RESECTION OF PROSTATE: SHX73

## 2012-02-26 HISTORY — DX: Retention of urine, unspecified: R33.9

## 2012-02-26 LAB — POCT I-STAT 4, (NA,K, GLUC, HGB,HCT)
Potassium: 4.3 mEq/L (ref 3.5–5.1)
Sodium: 137 mEq/L (ref 135–145)

## 2012-02-26 SURGERY — CYSTOSCOPY, WITH URETHRAL DILATION
Anesthesia: General | Site: Prostate | Wound class: Clean Contaminated

## 2012-02-26 MED ORDER — PHENAZOPYRIDINE HCL 200 MG PO TABS: 200.0000 mg | ORAL_TABLET | Freq: Three times a day (TID) | ORAL | Status: AC | PRN

## 2012-02-26 MED ORDER — LACTATED RINGERS IV SOLN: INTRAVENOUS | Status: DC

## 2012-02-26 MED ORDER — OXYBUTYNIN CHLORIDE 5 MG PO TABS
10.0000 mg | ORAL_TABLET | Freq: Three times a day (TID) | ORAL | Status: AC
  Administered 2012-02-26: 10 mg via ORAL

## 2012-02-26 MED ORDER — FENTANYL CITRATE 0.05 MG/ML IJ SOLN
25.0000 ug | INTRAMUSCULAR | Status: DC | PRN
  Administered 2012-02-26 (×3): 50 ug via INTRAVENOUS

## 2012-02-26 MED ORDER — LIDOCAINE HCL (CARDIAC) 20 MG/ML IV SOLN
INTRAVENOUS | Status: DC | PRN
  Administered 2012-02-26: 60 mg via INTRAVENOUS

## 2012-02-26 MED ORDER — MIDAZOLAM HCL 5 MG/5ML IJ SOLN
INTRAMUSCULAR | Status: DC | PRN
  Administered 2012-02-26: 2 mg via INTRAVENOUS

## 2012-02-26 MED ORDER — FENTANYL CITRATE 0.05 MG/ML IJ SOLN
INTRAMUSCULAR | Status: DC | PRN
  Administered 2012-02-26 (×2): 50 ug via INTRAVENOUS
  Administered 2012-02-26 (×3): 25 ug via INTRAVENOUS

## 2012-02-26 MED ORDER — HYDROCODONE-ACETAMINOPHEN 7.5-325 MG PO TABS
1.0000 | ORAL_TABLET | Freq: Four times a day (QID) | ORAL | Status: DC | PRN
  Administered 2012-02-26: 1 via ORAL

## 2012-02-26 MED ORDER — STERILE WATER FOR IRRIGATION IR SOLN
Status: DC | PRN
  Administered 2012-02-26: 3000 mL via INTRAVESICAL

## 2012-02-26 MED ORDER — PROMETHAZINE HCL 25 MG/ML IJ SOLN: 6.2500 mg | INTRAMUSCULAR | Status: DC | PRN

## 2012-02-26 MED ORDER — IOHEXOL 350 MG/ML SOLN
INTRAVENOUS | Status: DC | PRN
  Administered 2012-02-26: 10 mL via INTRAVENOUS

## 2012-02-26 MED ORDER — ONDANSETRON HCL 4 MG/2ML IJ SOLN
INTRAMUSCULAR | Status: DC | PRN
  Administered 2012-02-26: 4 mg via INTRAVENOUS

## 2012-02-26 MED ORDER — ACETAMINOPHEN 10 MG/ML IV SOLN
INTRAVENOUS | Status: DC | PRN
  Administered 2012-02-26: 1000 mg via INTRAVENOUS

## 2012-02-26 MED ORDER — HYDROCODONE-ACETAMINOPHEN 7.5-650 MG PO TABS: 1.0000 | ORAL_TABLET | Freq: Four times a day (QID) | ORAL | Status: AC | PRN

## 2012-02-26 MED ORDER — DEXAMETHASONE SODIUM PHOSPHATE 4 MG/ML IJ SOLN
INTRAMUSCULAR | Status: DC | PRN
  Administered 2012-02-26: 8 mg via INTRAVENOUS

## 2012-02-26 MED ORDER — MEPERIDINE HCL 25 MG/ML IJ SOLN: 6.2500 mg | INTRAMUSCULAR | Status: DC | PRN

## 2012-02-26 MED ORDER — SODIUM CHLORIDE 0.9 % IR SOLN
Status: DC | PRN
  Administered 2012-02-26: 3000 mL

## 2012-02-26 MED ORDER — CEFAZOLIN SODIUM 1-5 GM-% IV SOLN
1.0000 g | INTRAVENOUS | Status: AC
  Administered 2012-02-26: 1 g via INTRAVENOUS

## 2012-02-26 MED ORDER — LACTATED RINGERS IV SOLN
INTRAVENOUS | Status: DC
  Administered 2012-02-26 (×2): via INTRAVENOUS

## 2012-02-26 MED ORDER — PROPOFOL 10 MG/ML IV EMUL
INTRAVENOUS | Status: DC | PRN
  Administered 2012-02-26: 170 mg via INTRAVENOUS

## 2012-02-26 MED ORDER — PHENAZOPYRIDINE HCL 200 MG PO TABS
200.0000 mg | ORAL_TABLET | Freq: Three times a day (TID) | ORAL | Status: AC
  Administered 2012-02-26: 200 mg via ORAL

## 2012-02-26 SURGICAL SUPPLY — 38 items
ADAPTER CATH URET PLST 4-6FR (CATHETERS) IMPLANT
ADPR CATH URET STRL DISP 4-6FR (CATHETERS)
BAG DRAIN URO-CYSTO SKYTR STRL (DRAIN) ×3 IMPLANT
BAG DRN ANRFLXCHMBR STRAP LEK (BAG) ×2
BAG DRN UROCATH (DRAIN) ×2
BAG URINE LEG 19OZ MD ST LTX (BAG) ×3 IMPLANT
BALLN NEPHROSTOMY (BALLOONS)
BALLOON NEPHROSTOMY (BALLOONS) IMPLANT
BOOTIES KNEE HIGH SLOAN (MISCELLANEOUS) ×3 IMPLANT
CANISTER SUCT LVC 12 LTR MEDI- (MISCELLANEOUS) ×2 IMPLANT
CATH FOLEY 2W COUNCIL 20FR 5CC (CATHETERS) IMPLANT
CATH FOLEY 2WAY SLVR  5CC 18FR (CATHETERS) ×1
CATH FOLEY 2WAY SLVR 5CC 18FR (CATHETERS) IMPLANT
CATH INTERMIT  6FR 70CM (CATHETERS) IMPLANT
CATH URET 5FR 28IN CONE TIP (BALLOONS)
CATH URET 5FR 28IN OPEN ENDED (CATHETERS) IMPLANT
CATH URET 5FR 70CM CONE TIP (BALLOONS) IMPLANT
CLOTH BEACON ORANGE TIMEOUT ST (SAFETY) ×3 IMPLANT
DRAPE CAMERA CLOSED 9X96 (DRAPES) ×3 IMPLANT
ELECT RESECT VAPORIZE 12D CBL (ELECTRODE) ×3 IMPLANT
GLOVE BIO SURGEON STRL SZ 6.5 (GLOVE) ×1 IMPLANT
GLOVE BIO SURGEON STRL SZ7 (GLOVE) ×3 IMPLANT
GLOVE BIO SURGEON STRL SZ7.5 (GLOVE) ×3 IMPLANT
GLOVE ECLIPSE 6.0 STRL STRAW (GLOVE) ×1 IMPLANT
GLOVE INDICATOR 7.0 STRL GRN (GLOVE) ×1 IMPLANT
GOWN STRL REIN XL XLG (GOWN DISPOSABLE) ×3 IMPLANT
GOWN SURGICAL LARGE (GOWNS) ×2 IMPLANT
GOWN XL W/COTTON TOWEL STD (GOWNS) ×3 IMPLANT
GUIDEWIRE 0.038 PTFE COATED (WIRE) IMPLANT
GUIDEWIRE ANG ZIPWIRE 038X150 (WIRE) IMPLANT
GUIDEWIRE STR DUAL SENSOR (WIRE) ×3 IMPLANT
LASER FIBER DISP (UROLOGICAL SUPPLIES) ×4 IMPLANT
LOOP CUTTING 24FR OLYMPUS (CUTTING LOOP) ×1 IMPLANT
NS IRRIG 500ML POUR BTL (IV SOLUTION) ×2 IMPLANT
PACK CYSTOSCOPY (CUSTOM PROCEDURE TRAY) ×3 IMPLANT
SET ASPIRATION TUBING (TUBING) ×1 IMPLANT
SYRINGE IRR TOOMEY STRL 70CC (SYRINGE) IMPLANT
WATER STERILE IRR 3000ML UROMA (IV SOLUTION) ×9 IMPLANT

## 2012-02-26 NOTE — H&P (Signed)
Active Problems Problems  1. Acute Cystitis 595.0 2. Dysuria 788.1 3. Fever (Symptom) 780.60 4. Insomnia 780.52 5. 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 Male Stress Incontinence 788.32 7. Nephrolithiasis 592.0 8. Penis Swelling Edema 608.86 9. 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 Prostate Cancer 185 11. Scrotal Pain 12. 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 Urethralgia 788.99 14. Urinary Incontinence 788.30  History of Present Illness         Caleb Berry is a 71 YO male patient  with a hx of prostate cancer, incontinence, hypogonadism, and urethral stricture.  He is s/p cystoscopy/Cook dilation on 11/27/11. Hx of urethral stricture (post foley trauma) and hx of RRP for high grade CaP.  IPSS = 13/7.  06/30/11 labs:  PSA - 3.74 and Testosterone - 739.55  03/28/10  PSA - 0.98   Past Medical History Problems  1. History of  Acute Cystitis 595.0 2. History of  Acute Urinary Retention 788.20 3. History of  Cardiac Catheter His Ablation 4. History of  Cath Stent Placement 5. History of  Gross Hematuria 599.71 6. History of  Heart Disease 429.9 7. History of  Hypertension 401.9 8. History of  Hypertension 401.9 9. History of  Hypogonadism 257.2 10. 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 History of  Cystoscopy For Urethral Stricture 2. History of  Cystoscopy For Urethral Stricture 3. History of  Cystoscopy For Urethral Stricture 4. History of  Cystoscopy For Urethral Stricture 5. History of  Cystoscopy For Urethral Stricture 6. History of  Cystoscopy With Insertion Of Urethral Stent 7. History of  Cystoscopy With Internal Urethrotomy, Direct Vision 8. History of  Cystoscopy With Internal Urethrotomy, Direct Vision 9. History of  Cystoscopy With Ureteroscopy Left 10. History of  Lithotripsy Left 11. History of  Neurological Surgery Carotid Endarterectomy Left 12. History of  Percutaneous Lithotomy Left 13. History of  Prostatectomy Retropubic Radical  With Nerve Sparing 14. History of  Surgery Prostate Incision 15. History of  Transuret Resect Of Regrow Postop Bladder Neck Contracture  Current Meds 1. Amoxicillin 500 MG Oral Capsule; Therapy: 20Feb2013 to 2. Aspirin 81 MG Oral Tablet; Therapy: (Recorded:27Feb2013) to 3. Axiron 30 MG/ACT Transdermal Solution; use 2 pumps, with 1 swipe under the arm each am;  Therapy: 14Aug2012 to (Evaluate:20Aug2013); Last Rx:21Feb2013 4. Clotrimazole-Betamethasone 1-0.05 % External Cream; APPLY A THIN FILM TO AFFECTED  AREAS 3 TIMES A DAY; Therapy: 16Oct2009 to (Evaluate:22Mar2012); Last Rx:23Nov2011 5. Hydrocodone-Acetaminophen 5-325 MG Oral Tablet; Therapy: (Recorded:04Aug2011) to 6. Lidocaine HCl 2 % External Gel; APPLY AS DIRECTED; Therapy: 31Mar2009 to  (Evaluate:02Jun2013)  Requested for: 05Dec2012; Last Rx:04Dec2012 7. Lisinopril TABS; Therapy: (Recorded:06Aug2009) to 8. Metoprolol Tartrate 100 MG Oral Tablet; Therapy: (Recorded:21Nov2007) to 9. Neurontin 100 MG Oral Capsule; Therapy: (Recorded:21Nov2007) to 10. Potassium Chloride PACK; Therapy: (Recorded:21Nov2007) to 11. PriLOSEC OTC TBEC; Therapy: (Recorded:21Nov2007) to 12. Trimix (PGE 40, PAP 30, PHE 0.5); USE AS DIRECTED, BUT NOT MORE THAN 3 X IN A WEEK;   Therapy: 19Jan2009 to (Last Rx:15Aug2012)  Requested for: 15Aug2012 13. Viagra 100 MG Oral Tablet; TAKE 1 TABLET As Directed; Therapy: 05Apr2012 to (Last   Rx:05Apr2012)  Allergies Medication  1. Aleve TABS 2. Codeine Derivatives  Family History Problems  1. Family history of  Family Health Status Number Of Children 2 sons 2. Family history of  Urologic Disorder V18.7 kidney stones-mother  Social History Problems  1. Marital History - Currently Married 2. Never A Smoker 3. Occupation: [employer]  4. Alcohol Use 5.  Tobacco Use V15.82  Review of Systems Constitutional, skin, eye, otolaryngeal, cardiovascular, endocrine, gastrointestinal, neurological and  psychiatric system(s) were reviewed and pertinent findings if present are noted.  Genitourinary: urinary frequency, feelings of urinary urgency, nocturia, incontinence, weak urinary stream, urinary stream starts and stops, incomplete emptying of bladder and erectile dysfunction.  Hematologic/Lymphatic: a tendency to easily bruise.  Respiratory: cough.  Musculoskeletal: back pain.    Vitals Vital Signs [Data Includes: Last 1 Day]  27Feb2013 04:04PM  Blood Pressure: 162 / 99 Temperature: 98.2 F Heart Rate: 86  Results/Data Urine [Data Includes: Last 1 Day]   27Feb2013  COLOR YELLOW   APPEARANCE CLEAR   SPECIFIC GRAVITY 1.015   pH 6.0   GLUCOSE NEG mg/dL  BILIRUBIN NEG   KETONE NEG mg/dL  BLOOD NEG   PROTEIN NEG mg/dL  UROBILINOGEN 0.2 mg/dL  NITRITE NEG   LEUKOCYTE ESTERASE NEG    Assessment Assessed  1. Urinary Incontinence 788.30 2. 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 His last psa is 3.74.   Plan Dysuria (788.1)  1. Transderm-Scop 1.5 MG Transdermal Patch 72 Hour; APPLY 1 PATCH EVERY 3 DAYS; Therapy:  14Aug2012 to 27Feb2013; Last Rx:14Aug2012; Status: DISCONTINUED Health Maintenance (V70.0)  2. UA With REFLEX  Done: 27Feb2013 03:08PM Insomnia (780.52)  3. 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 Follow-up Month x 4 Office  Follow-up  Requested for: 27Feb2013 Nephrolithiasis (592.0)  5. Allopurinol 100 MG Oral Tablet; Take 1 tablet every day; Therapy: 01Apr2008 to  (Evaluate:22Feb2014); Last Rx:27Feb2013 6. Hydrochlorothiazide 50 MG Oral Tablet; Take 1 tablet every day; Therapy: 07Apr2008 to  (Evaluate:22Feb2014); Last Rx:27Feb2013 Urethral Stricture (598.9)  7.  Diazepam 5 MG Oral Tablet; TAKE 1 TABLET Bedtime; Therapy: 25May2012 to 27Feb2013; Last  Rx:25May2012; Status: DISCONTINUED   Arrange cysto and possible laser of stricture.   Signatures

## 2012-02-26 NOTE — Anesthesia Postprocedure Evaluation (Signed)
  Anesthesia Post-op Note  Patient: Caleb Berry  Procedure(s) Performed: Procedure(s) (LRB): CYSTOSCOPY WITH URETHRAL DILATATION (N/A) TRANSURETHRAL RESECTION OF THE PROSTATE (TURP) ()  Patient Location: PACU  Anesthesia Type: General  Level of Consciousness: awake and alert   Airway and Oxygen Therapy: Patient Spontanous Breathing  Post-op Pain: mild  Post-op Assessment: Post-op Vital signs reviewed, Patient's Cardiovascular Status Stable, Respiratory Function Stable, Patent Airway and No signs of Nausea or vomiting  Post-op Vital Signs: stable  Complications: No apparent anesthesia complications

## 2012-02-26 NOTE — Interval H&P Note (Signed)
History and Physical Interval Note:  02/26/2012 11:02 AM  Caleb Berry  has presented today for surgery, with the diagnosis of urethral stricture  The various methods of treatment have been discussed with the patient and family. After consideration of risks, benefits and other options for treatment, the patient has consented to  Procedure(s) (LRB): CYSTOSCOPY WITH URETHRAL DILATATION (N/A) as a surgical intervention .  The patients' history has been reviewed, patient examined, no change in status, stable for surgery.  I have reviewed the patients' chart and labs.  Questions were answered to the patient's satisfaction.     Jethro Bolus I

## 2012-02-26 NOTE — Op Note (Signed)
Pre-operative diagnosis : Posterior urethral stricture  Postoperative diagnosis: Same  Operation: Cystourethroscopy, urethral dilation, attempted laser vaporization of post urethral stricture, transurethral resection of posterior urethral stricture  Surgeon:  S. Patsi Sears, MD  First assistant: None  [id number]}  Preparation: After appropriate preanesthesia, the patient was brought to the operating room and placed on the operating table in the dorsal supine position, where general LMA anesthesia was introduced. The arm and was rechecked. The patient then underwent prep and drape in the usual fashion.  Review history: The patient is a 71 year old male, status post radical retropubic prostatectomy greater than 15 years ago, for Gleason 9 prostate cancer. He developed bladder neck contracture postoperatively, with urinary control. However, the patient had subsequent urethral, during catheterization for carotid surgery, and then developed posterior urethral stricture and bladder neck contracture, requiring transurethral incision all multiplications. A prostatic stent was placed in order to try to keep the bladder neck open. The patient became incontinent, and is used a penile clamp, with self intermittent catheterization for a chronic hypotonic bladder, which is developed of the last several years. He has developed intermittent urethral stricture disease, requiring incision, balloon dilation and occasional transurethral resection of scar tissue growing through the interstices of the posterior ureteral stent. He is now for cystoscopy and treatment of his urethral stricture disease.  Statement of  Likelihood of Success: Excellent. TIME-OUT observed.:  Procedure: Cystourethroscopy was accomplished, shows dense proximal ureteral stricture the perineum unable to get the scope through the stricture, although it appears to be strictured in the open position. The patient reports that he is unable to  perform self catheterization, even with the coud catheter through the strictured area. Therefore, dilated the stricture from a 16 to a 50 [place] with [employer]. A placed the continuous flow cystoscope into the proximal urethra, and was able to manipulated into the bladder. Using continuous flow, then resected the bladder neck and proximal urethral stricture, down to the wire loops of the proximal ureteral stent. Chips were evacuated, and sent to laboratory for examination because the patient's history of prostate cancer. I placed an 76 French Foley catheter, and will remove this as an outpatient.  The patient received IV Tylenol, and was awakened, taken to recovery room in excellent condition.

## 2012-02-26 NOTE — Anesthesia Procedure Notes (Signed)
Procedure Name: LMA Insertion Date/Time: 02/26/2012 11:13 AM Performed by: Renella Cunas D Pre-anesthesia Checklist: Patient identified, Emergency Drugs available, Suction available and Patient being monitored Patient Re-evaluated:Patient Re-evaluated prior to inductionOxygen Delivery Method: Circle System Utilized Preoxygenation: Pre-oxygenation with 100% oxygen Intubation Type: IV induction Ventilation: Mask ventilation without difficulty LMA: LMA inserted LMA Size: 4.0 Number of attempts: 1 Airway Equipment and Method: bite block Placement Confirmation: positive ETCO2 Tube secured with: Tape Dental Injury: Teeth and Oropharynx as per pre-operative assessment

## 2012-02-26 NOTE — Anesthesia Preprocedure Evaluation (Signed)
Anesthesia Evaluation  Patient identified by MRN, date of birth, ID band Patient awake    Reviewed: Allergy & Precautions, H&P , NPO status , Patient's Chart, lab work & pertinent test results, reviewed documented beta blocker date and time   Airway Mallampati: II TM Distance: >3 FB Neck ROM: Full    Dental  (+) Partial Lower and Dental Advisory Given   Pulmonary former smoker breath sounds clear to auscultation        Cardiovascular hypertension, Pt. on medications + CAD, + Past MI and + Peripheral Vascular Disease Rhythm:Regular Rate:Normal + Systolic Click CAD, s/p CABG 2002 PTCA w/ stent 2003, 2005 Hx MI 2006 Currently asymptomatic. Cards office visit Dec Stable May '12 heart cath, patent grafts. No intervention Carotid endarterectomy-Left, no sequlae   Neuro/Psych negative neurological ROS  negative psych ROS   GI/Hepatic negative GI ROS, Neg liver ROS,   Endo/Other  negative endocrine ROS  Renal/GU negative Renal ROS   Urethral stricture/BNC Hx prostate cancer    Musculoskeletal negative musculoskeletal ROS (+)   Abdominal   Peds negative pediatric ROS (+)  Hematology negative hematology ROS (+)   Anesthesia Other Findings Front upper Caps  Reproductive/Obstetrics negative OB ROS                           Anesthesia Physical  Anesthesia Plan  ASA: III  Anesthesia Plan: General   Post-op Pain Management:    Induction: Intravenous  Airway Management Planned: LMA  Additional Equipment:   Intra-op Plan:   Post-operative Plan: Extubation in OR  Informed Consent: I have reviewed the patients History and Physical, chart, labs and discussed the procedure including the risks, benefits and alternatives for the proposed anesthesia with the patient or authorized representative who has indicated his/her understanding and acceptance.     Plan Discussed with: CRNA and  Surgeon  Anesthesia Plan Comments:         Anesthesia Quick Evaluation

## 2012-02-26 NOTE — Progress Notes (Signed)
Caleb Haymaker RN notified Dr Patsi Sears of pt leaking around foley cath. New order noted to give pt ditropan ER 10mg  now & OK to d/c pt home. Pt informed to go to Dr Imelda Pillow office 03/01/12 for nurse visit.

## 2012-02-26 NOTE — Transfer of Care (Signed)
Immediate Anesthesia Transfer of Care Note  Patient: Caleb Berry  Procedure(s) Performed: Procedure(s) (LRB): CYSTOSCOPY WITH URETHRAL DILATATION (N/A) TRANSURETHRAL RESECTION OF THE PROSTATE (TURP) ()  Patient Location: PACU  Anesthesia Type: General  Level of Consciousness: awake, oriented, sedated and patient cooperative  Airway & Oxygen Therapy: Patient Spontanous Breathing and Patient connected to face mask oxygen  Post-op Assessment: Report given to PACU RN and Post -op Vital signs reviewed and stable  Post vital signs: Reviewed and stable  Complications: No apparent anesthesia complications

## 2012-03-01 ENCOUNTER — Encounter (HOSPITAL_BASED_OUTPATIENT_CLINIC_OR_DEPARTMENT_OTHER): Payer: Self-pay | Admitting: Urology

## 2012-03-03 ENCOUNTER — Encounter (HOSPITAL_BASED_OUTPATIENT_CLINIC_OR_DEPARTMENT_OTHER): Payer: Self-pay

## 2012-03-10 ENCOUNTER — Other Ambulatory Visit: Payer: Self-pay

## 2012-03-10 ENCOUNTER — Ambulatory Visit: Payer: Medicare Other | Admitting: Cardiology

## 2012-03-10 MED ORDER — LISINOPRIL 2.5 MG PO TABS: 2.5000 mg | ORAL_TABLET | Freq: Every day | ORAL | Status: DC

## 2012-03-10 MED ORDER — POTASSIUM CHLORIDE CRYS ER 20 MEQ PO TBCR: 10.0000 meq | EXTENDED_RELEASE_TABLET | Freq: Two times a day (BID) | ORAL | Status: DC

## 2012-03-10 MED ORDER — METOPROLOL TARTRATE 50 MG PO TABS: 50.0000 mg | ORAL_TABLET | Freq: Two times a day (BID) | ORAL | Status: DC

## 2012-03-17 ENCOUNTER — Ambulatory Visit (INDEPENDENT_AMBULATORY_CARE_PROVIDER_SITE_OTHER): Payer: Medicare Other | Admitting: Cardiology

## 2012-03-17 ENCOUNTER — Encounter: Payer: Self-pay | Admitting: Cardiology

## 2012-03-17 VITALS — BP 128/78 | HR 68 | Ht 68.5 in | Wt 174.1 lb

## 2012-03-17 DIAGNOSIS — I2510 Atherosclerotic heart disease of native coronary artery without angina pectoris: Secondary | ICD-10-CM

## 2012-03-17 DIAGNOSIS — Z79899 Other long term (current) drug therapy: Secondary | ICD-10-CM

## 2012-03-17 DIAGNOSIS — E7800 Pure hypercholesterolemia, unspecified: Secondary | ICD-10-CM

## 2012-03-17 DIAGNOSIS — I10 Essential (primary) hypertension: Secondary | ICD-10-CM

## 2012-03-17 LAB — BASIC METABOLIC PANEL
BUN: 18 mg/dL (ref 6–23)
Calcium: 9.5 mg/dL (ref 8.4–10.5)
Creatinine, Ser: 1.4 mg/dL (ref 0.4–1.5)
GFR: 51.44 mL/min — ABNORMAL LOW (ref >60.00)
Glucose, Bld: 108 mg/dL — ABNORMAL HIGH (ref 70–99)

## 2012-03-17 LAB — MAGNESIUM: Magnesium: 1.5 mg/dL (ref 1.5–2.5)

## 2012-03-17 MED ORDER — METOPROLOL TARTRATE 50 MG PO TABS: 50.0000 mg | ORAL_TABLET | Freq: Two times a day (BID) | ORAL | Status: DC

## 2012-03-17 NOTE — Patient Instructions (Signed)
Your physician wants you to follow-up in:  6 MONTHS WITH DR  Fredrik Cove will receive a reminder letter in the mail two months in advance. If you don't receive a letter, please call our office to schedule the follow-up appointment. Your physician recommends that you continue on your current medications as directed. Please refer to the Current Medication list given to you today. Your physician recommends that you return for lab work in: TODAY   BMET  MAG  DX 401.1 V58.69

## 2012-03-21 NOTE — Progress Notes (Signed)
HPI:  Caleb Berry is in for follow up.  He intimated to me that he may be thinking about retiring in the near future.  No chest pain at the present time.  He continues to use up to 3 Vicodins per day for vertebral arthritis.  He feels like he continues to have at times so low grade infections due to continued problems with his bladder.  His tongue will split and he will get some irritation in the corners of his mouth.  However from our standpoint he continues to remain quite stable.    Current Outpatient Prescriptions  Medication Sig Dispense Refill  . allopurinol (ZYLOPRIM) 100 MG tablet Take 100 mg by mouth daily.      . B Complex Vitamins (VITAMIN B COMPLEX PO) Take 1 capsule by mouth daily.      . cetirizine (ZYRTEC) 10 MG tablet Take 10 mg by mouth as needed.       . doxycycline (VIBRA-TABS) 100 MG tablet Take 100 mg by mouth 2 (two) times daily.      Marland Kitchen gabapentin (NEURONTIN) 100 MG capsule Take 100 mg by mouth 5 (five) times daily. 1 tab in the morning, 1 tab at lunch, 1 tab in the evening, and 2 tabs at bedtime      . hydrochlorothiazide (HYDRODIURIL) 25 MG tablet Take 12.5 mg by mouth 2 (two) times daily. Take one-half by mouth twice a day      . HYDROcodone-acetaminophen (VICODIN) 5-500 MG per tablet Take 1 tablet by mouth every 6 (six) hours as needed.       Marland Kitchen lisinopril (PRINIVIL,ZESTRIL) 2.5 MG tablet Take 1 tablet (2.5 mg total) by mouth daily.  90 tablet  3  . magnesium oxide (MAG-OX) 400 MG tablet Take 1 tablet (400 mg total) by mouth daily.      . metoprolol (LOPRESSOR) 50 MG tablet Take 1 tablet (50 mg total) by mouth 2 (two) times daily.  12 tablet  1  . nitroGLYCERIN (NITROSTAT) 0.4 MG SL tablet Place 1 tablet (0.4 mg total) under the tongue every 5 (five) minutes as needed for chest pain.  25 tablet  4  . omeprazole (PRILOSEC) 20 MG capsule Take 20 mg by mouth every morning.       Marland Kitchen OVER THE COUNTER MEDICATION Take 1 capsule by mouth daily. Green miracle      . potassium  chloride SA (K-DUR,KLOR-CON) 20 MEQ tablet Take 0.5 tablets (10 mEq total) by mouth 2 (two) times daily.  90 tablet  3  . ranitidine (ZANTAC) 150 MG capsule Take 150 mg by mouth every evening.       . Testosterone (AXIRON TD) Apply 1.5 mg topically daily.      Marland Kitchen zolpidem (AMBIEN) 10 MG tablet Take 10 mg by mouth at bedtime as needed.      Marland Kitchen DISCONTD: mometasone (NASONEX) 50 MCG/ACT nasal spray 2 sprays by Nasal route as needed.         Allergies  Allergen Reactions  . Aleve Other (See Comments)    CHEST TIGHTNESS  . Codeine Other (See Comments)    SEVERE HEADACHES IF TAKEN ON A DAILY BASIS  . Statins Other (See Comments)    SEVERE MUSCLE ACHES/ JOINT PAIN    Past Medical History  Diagnosis Date  . HTN (hypertension)   . History of prostate cancer     S/P RADICAL PROSTATECTOMY  . Nephrolithiasis   . Stricture of urethra SELF-CATH AS NEEDED    S/P  MULTIPLE DILATION'S   . Bladder neck contracture     POST TUI AND UROLUM STENTS  . Status post carotid endarterectomy LEFT  . Myocardial infarction 2006    S/P PTCA W/ X2 DE STENTS  . Status post primary angioplasty with coronary stent 2006-- MI    X2 DRUG-ELUTING STENTS  . CAD (coronary artery disease) CARDIOLOGIST- DR Riley Kill- VISIT NOTE 09-08-11 IN EPIC    LAST STRESS TEST 2006 ABNORMAL  . DDD (degenerative disc disease)   . History of gout PT STATES STABLE  . GERD (gastroesophageal reflux disease)     CONTROLLED W/ PRILOSEC AND ZANTAC  . Impaired hearing BILATERAL AIDS  . Insomnia   . S/P coronary artery bypass graft x 3   . Peripheral neuropathy FEET  . Foley catheter in place   . Urinary retention     Past Surgical History  Procedure Date  . Cysto w/ balloon dilation urethral stricture 05-19-2011  . Transurethral vaporization bladder neck contracture / dilatation urethral stricture 12-26-2010  . Coronary artery bypass graft 2000    X3  . Prostatectomy 1996  . Cystoscopy w/ internal urethrotomy 2003      X2--   W/  UROLUME PROSTHESIS  . Percutaneous nephrotomy 1989  . Extracorporeal shock wave lithotripsy X5  . Urethral dilatation's MULTIPLE   . Coronary angioplasty 2006    X2 DRUG-ELUTING STENTS  . Cardiac catheterization 12-18-2010 AND 2008  . Carotid endarterectomy 2002    LEFT  . Cystoscopy with urethral dilatation 11/27/2011    Procedure: CYSTOSCOPY WITH URETHRAL DILATATION;  Surgeon: Kathi Ludwig, MD;  Location: [facility];  Service: Urology;  Laterality: N/A;  ROOM # 2 REQUESTED (COOK)   . Cystoscopy with urethral dilatation 02/26/2012    Procedure: CYSTOSCOPY WITH URETHRAL DILATATION;  Surgeon: Kathi Ludwig, MD;  Location: [facility];  Service: Urology;  Laterality: N/A;  cysto, urethral dilation and possible holmium laser of stricture c arm, laser  . Transurethral resection of prostate 02/26/2012    Procedure: TRANSURETHRAL RESECTION OF THE PROSTATE (TURP);  Surgeon: Kathi Ludwig, MD;  Location: [facility];  Service: Urology;;    Family History  Problem Relation Age of Onset  . Hypertension      History   Social History  . Marital Status: Married    Spouse Name: N/A    Number of Children: N/A  . Years of Education: N/A   Occupational History  . pastor    Social History Main Topics  . Smoking status: Former Smoker    Types: Cigarettes    Quit date: 11/24/1968  . Smokeless tobacco: Never Used  . Alcohol Use: No  . Drug Use: No  . Sexually Active: Not on file   Other Topics Concern  . Not on file   Social History Narrative  . No narrative on file    ROS: Please see the HPI.  All other systems reviewed and negative.  PHYSICAL EXAM:  BP 128/78  Pulse 68  Ht 5' 8.5" (1.74 m)  Wt 174 lb 1.9 oz (78.98 kg)  BMI 26.09 kg/m2  General: Well developed, well nourished, in no acute distress. Head:  Normocephalic and atraumatic. Neck: no JVD Lungs: Clear to auscultation and percussion. Heart: Normal S1  and S2.  No murmur, rubs or gallops.  Abdomen:  Normal bowel sounds; soft; non tender; no organomegaly Pulses: Pulses normal in all 4 extremities. Extremities: No clubbing or cyanosis. No edema. Neurologic: Alert and  oriented x 3.  EKG:  NSR.  Normal ECG.    ASSESSMENT AND PLAN:

## 2012-04-08 NOTE — Assessment & Plan Note (Signed)
No current chest pain.  Continue to monitor.

## 2012-04-08 NOTE — Assessment & Plan Note (Signed)
Has never been able to take statin drugs.

## 2012-04-08 NOTE — Assessment & Plan Note (Addendum)
Well controlled.  We will check BMET since he remains on multiple drugs, including ACE, diuretic, and K.

## 2012-06-08 ENCOUNTER — Encounter: Payer: Self-pay | Admitting: Gastroenterology

## 2012-07-25 DIAGNOSIS — I82409 Acute embolism and thrombosis of unspecified deep veins of unspecified lower extremity: Secondary | ICD-10-CM

## 2012-07-25 HISTORY — DX: Acute embolism and thrombosis of unspecified deep veins of unspecified lower extremity: I82.409

## 2012-08-10 ENCOUNTER — Inpatient Hospital Stay (HOSPITAL_COMMUNITY)
Admission: EM | Admit: 2012-08-10 | Discharge: 2012-08-12 | DRG: 872 | Disposition: A | Discharge status: Home Health | Payer: Medicare Other | Attending: Family Medicine | Admitting: Family Medicine

## 2012-08-10 ENCOUNTER — Encounter (HOSPITAL_COMMUNITY): Payer: Self-pay | Admitting: *Deleted

## 2012-08-10 DIAGNOSIS — N390 Urinary tract infection, site not specified: Secondary | ICD-10-CM | POA: Diagnosis present

## 2012-08-10 DIAGNOSIS — IMO0002 Reserved for concepts with insufficient information to code with codable children: Secondary | ICD-10-CM | POA: Diagnosis present

## 2012-08-10 DIAGNOSIS — I25810 Atherosclerosis of coronary artery bypass graft(s) without angina pectoris: Secondary | ICD-10-CM

## 2012-08-10 DIAGNOSIS — M109 Gout, unspecified: Secondary | ICD-10-CM | POA: Diagnosis present

## 2012-08-10 DIAGNOSIS — R7401 Elevation of levels of liver transaminase levels: Secondary | ICD-10-CM | POA: Diagnosis present

## 2012-08-10 DIAGNOSIS — N35919 Unspecified urethral stricture, male, unspecified site: Secondary | ICD-10-CM | POA: Diagnosis present

## 2012-08-10 DIAGNOSIS — R000 Tachycardia, unspecified: Secondary | ICD-10-CM | POA: Diagnosis present

## 2012-08-10 DIAGNOSIS — Z951 Presence of aortocoronary bypass graft: Secondary | ICD-10-CM

## 2012-08-10 DIAGNOSIS — G5790 Unspecified mononeuropathy of unspecified lower limb: Secondary | ICD-10-CM | POA: Diagnosis present

## 2012-08-10 DIAGNOSIS — I10 Essential (primary) hypertension: Secondary | ICD-10-CM | POA: Diagnosis present

## 2012-08-10 DIAGNOSIS — I2510 Atherosclerotic heart disease of native coronary artery without angina pectoris: Secondary | ICD-10-CM | POA: Diagnosis present

## 2012-08-10 DIAGNOSIS — R319 Hematuria, unspecified: Secondary | ICD-10-CM | POA: Diagnosis present

## 2012-08-10 DIAGNOSIS — E7800 Pure hypercholesterolemia, unspecified: Secondary | ICD-10-CM | POA: Diagnosis present

## 2012-08-10 DIAGNOSIS — Z9861 Coronary angioplasty status: Secondary | ICD-10-CM

## 2012-08-10 DIAGNOSIS — I252 Old myocardial infarction: Secondary | ICD-10-CM

## 2012-08-10 DIAGNOSIS — Z87891 Personal history of nicotine dependence: Secondary | ICD-10-CM

## 2012-08-10 DIAGNOSIS — A419 Sepsis, unspecified organism: Secondary | ICD-10-CM | POA: Diagnosis present

## 2012-08-10 DIAGNOSIS — R7402 Elevation of levels of lactic acid dehydrogenase (LDH): Secondary | ICD-10-CM | POA: Diagnosis present

## 2012-08-10 DIAGNOSIS — E785 Hyperlipidemia, unspecified: Secondary | ICD-10-CM | POA: Diagnosis present

## 2012-08-10 DIAGNOSIS — Z79899 Other long term (current) drug therapy: Secondary | ICD-10-CM

## 2012-08-10 DIAGNOSIS — Z7982 Long term (current) use of aspirin: Secondary | ICD-10-CM

## 2012-08-10 DIAGNOSIS — Z8546 Personal history of malignant neoplasm of prostate: Secondary | ICD-10-CM

## 2012-08-10 DIAGNOSIS — N200 Calculus of kidney: Secondary | ICD-10-CM

## 2012-08-10 DIAGNOSIS — K219 Gastro-esophageal reflux disease without esophagitis: Secondary | ICD-10-CM | POA: Diagnosis present

## 2012-08-10 LAB — BASIC METABOLIC PANEL
BUN: 18 mg/dL (ref 6–23)
Calcium: 9.4 mg/dL (ref 8.4–10.5)
GFR calc Af Amer: 67 mL/min — ABNORMAL LOW (ref >90)
GFR calc non Af Amer: 58 mL/min — ABNORMAL LOW (ref >90)
Glucose, Bld: 130 mg/dL — ABNORMAL HIGH (ref 70–99)
Potassium: 3.8 mEq/L (ref 3.5–5.1)
Sodium: 133 mEq/L — ABNORMAL LOW (ref 135–145)

## 2012-08-10 LAB — URINALYSIS, ROUTINE W REFLEX MICROSCOPIC
Bilirubin Urine: NEGATIVE
Glucose, UA: NEGATIVE mg/dL
Ketones, ur: NEGATIVE mg/dL
Nitrite: NEGATIVE
Protein, ur: NEGATIVE mg/dL
Specific Gravity, Urine: 1.018 (ref 1.005–1.030)
Urobilinogen, UA: 0.2 mg/dL (ref 0.0–1.0)
pH: 7 (ref 5.0–8.0)

## 2012-08-10 LAB — CBC WITH DIFFERENTIAL/PLATELET
Basophils Relative: 0 % (ref 0–1)
Eosinophils Absolute: 0.1 10*3/uL (ref 0.0–0.7)
Eosinophils Relative: 1 % (ref 0–5)
Lymphs Abs: 0.8 10*3/uL (ref 0.7–4.0)
MCH: 22.4 pg — ABNORMAL LOW (ref 26.0–34.0)
MCHC: 30.9 g/dL (ref 30.0–36.0)
MCV: 72.6 fL — ABNORMAL LOW (ref 78.0–100.0)
Monocytes Relative: 5 % (ref 3–12)
Neutrophils Relative %: 88 % — ABNORMAL HIGH (ref 43–77)
Platelets: 267 10*3/uL (ref 150–400)

## 2012-08-10 LAB — URINE MICROSCOPIC-ADD ON

## 2012-08-10 LAB — PROCALCITONIN: Procalcitonin: 0.73 ng/mL

## 2012-08-10 LAB — LACTIC ACID, PLASMA: Lactic Acid, Venous: 3.3 mmol/L — ABNORMAL HIGH (ref 0.5–2.2)

## 2012-08-10 LAB — MAGNESIUM: Magnesium: 1.4 mg/dL — ABNORMAL LOW (ref 1.5–2.5)

## 2012-08-10 MED ORDER — FAMOTIDINE 20 MG PO TABS
20.0000 mg | ORAL_TABLET | Freq: Every day | ORAL | Status: DC
  Administered 2012-08-10 – 2012-08-11 (×2): 20 mg via ORAL
  Filled 2012-08-10 (×3): qty 1

## 2012-08-10 MED ORDER — ALUM & MAG HYDROXIDE-SIMETH 200-200-20 MG/5ML PO SUSP: 30.0000 mL | Freq: Four times a day (QID) | ORAL | Status: DC | PRN

## 2012-08-10 MED ORDER — FLEET ENEMA 7-19 GM/118ML RE ENEM: 1.0000 | ENEMA | Freq: Once | RECTAL | Status: AC | PRN

## 2012-08-10 MED ORDER — SODIUM CHLORIDE 0.9 % IV SOLN
INTRAVENOUS | Status: AC
  Administered 2012-08-10 – 2012-08-11 (×2): via INTRAVENOUS

## 2012-08-10 MED ORDER — BISACODYL 5 MG PO TBEC: 10.0000 mg | DELAYED_RELEASE_TABLET | Freq: Every day | ORAL | Status: DC | PRN

## 2012-08-10 MED ORDER — GABAPENTIN 100 MG PO CAPS
200.0000 mg | ORAL_CAPSULE | Freq: Every day | ORAL | Status: DC
  Administered 2012-08-10 – 2012-08-11 (×2): 200 mg via ORAL
  Filled 2012-08-10 (×3): qty 2

## 2012-08-10 MED ORDER — ACETAMINOPHEN 325 MG PO TABS
650.0000 mg | ORAL_TABLET | Freq: Four times a day (QID) | ORAL | Status: DC | PRN
  Administered 2012-08-10 – 2012-08-11 (×2): 650 mg via ORAL
  Filled 2012-08-10 (×2): qty 2

## 2012-08-10 MED ORDER — SODIUM CHLORIDE 0.9 % IV BOLUS (SEPSIS): 1000.0000 mL | Freq: Once | INTRAVENOUS | Status: DC

## 2012-08-10 MED ORDER — HYDROCODONE-ACETAMINOPHEN 5-325 MG PO TABS
1.0000 | ORAL_TABLET | Freq: Four times a day (QID) | ORAL | Status: DC | PRN
  Administered 2012-08-10 – 2012-08-12 (×5): 1 via ORAL
  Filled 2012-08-10 (×5): qty 1

## 2012-08-10 MED ORDER — LISINOPRIL 2.5 MG PO TABS
2.5000 mg | ORAL_TABLET | Freq: Every day | ORAL | Status: DC
  Administered 2012-08-10 – 2012-08-12 (×3): 2.5 mg via ORAL
  Filled 2012-08-10 (×3): qty 1

## 2012-08-10 MED ORDER — ONDANSETRON HCL 4 MG/2ML IJ SOLN: 4.0000 mg | Freq: Three times a day (TID) | INTRAMUSCULAR | Status: AC | PRN

## 2012-08-10 MED ORDER — DEXTROSE 5 % IV SOLN
1.0000 g | Freq: Once | INTRAVENOUS | Status: AC
  Administered 2012-08-10: 1 g via INTRAVENOUS
  Filled 2012-08-10: qty 10

## 2012-08-10 MED ORDER — LIDOCAINE HCL 2 % EX GEL
CUTANEOUS | Status: AC
  Administered 2012-08-10: 10
  Filled 2012-08-10: qty 10

## 2012-08-10 MED ORDER — PNEUMOCOCCAL VAC POLYVALENT 25 MCG/0.5ML IJ INJ
0.5000 mL | INJECTION | INTRAMUSCULAR | Status: AC
  Filled 2012-08-10: qty 0.5

## 2012-08-10 MED ORDER — SODIUM CHLORIDE 0.9 % IV SOLN: INTRAVENOUS | Status: AC

## 2012-08-10 MED ORDER — DIAZEPAM 5 MG PO TABS
5.0000 mg | ORAL_TABLET | Freq: Four times a day (QID) | ORAL | Status: DC | PRN
  Administered 2012-08-10 – 2012-08-11 (×2): 5 mg via ORAL
  Filled 2012-08-10 (×2): qty 1

## 2012-08-10 MED ORDER — LORATADINE 10 MG PO TABS
10.0000 mg | ORAL_TABLET | Freq: Every day | ORAL | Status: DC
  Administered 2012-08-10 – 2012-08-12 (×3): 10 mg via ORAL
  Filled 2012-08-10 (×3): qty 1

## 2012-08-10 MED ORDER — NITROGLYCERIN 0.4 MG SL SUBL: 0.4000 mg | SUBLINGUAL_TABLET | SUBLINGUAL | Status: DC | PRN

## 2012-08-10 MED ORDER — ONDANSETRON HCL 4 MG PO TABS: 4.0000 mg | ORAL_TABLET | Freq: Four times a day (QID) | ORAL | Status: DC | PRN

## 2012-08-10 MED ORDER — SENNOSIDES-DOCUSATE SODIUM 8.6-50 MG PO TABS
1.0000 | ORAL_TABLET | Freq: Every evening | ORAL | Status: DC | PRN
  Filled 2012-08-10: qty 1

## 2012-08-10 MED ORDER — ONDANSETRON HCL 4 MG/2ML IJ SOLN
4.0000 mg | Freq: Four times a day (QID) | INTRAMUSCULAR | Status: DC | PRN
  Filled 2012-08-10: qty 2

## 2012-08-10 MED ORDER — SODIUM CHLORIDE 0.9 % IV BOLUS (SEPSIS)
1000.0000 mL | Freq: Once | INTRAVENOUS | Status: AC
  Administered 2012-08-10: 1000 mL via INTRAVENOUS

## 2012-08-10 MED ORDER — SODIUM CHLORIDE 0.9 % IJ SOLN
3.0000 mL | Freq: Two times a day (BID) | INTRAMUSCULAR | Status: DC
  Administered 2012-08-11 – 2012-08-12 (×3): 3 mL via INTRAVENOUS

## 2012-08-10 MED ORDER — GABAPENTIN 100 MG PO CAPS
100.0000 mg | ORAL_CAPSULE | Freq: Three times a day (TID) | ORAL | Status: DC
  Administered 2012-08-10 – 2012-08-12 (×7): 100 mg via ORAL
  Filled 2012-08-10 (×9): qty 1

## 2012-08-10 MED ORDER — ASPIRIN EC 81 MG PO TBEC
81.0000 mg | DELAYED_RELEASE_TABLET | Freq: Every morning | ORAL | Status: DC
  Administered 2012-08-10 – 2012-08-12 (×3): 81 mg via ORAL
  Filled 2012-08-10 (×3): qty 1

## 2012-08-10 MED ORDER — PANTOPRAZOLE SODIUM 40 MG PO TBEC
40.0000 mg | DELAYED_RELEASE_TABLET | Freq: Every day | ORAL | Status: DC
  Administered 2012-08-10 – 2012-08-12 (×3): 40 mg via ORAL
  Filled 2012-08-10 (×3): qty 1

## 2012-08-10 MED ORDER — LISINOPRIL 2.5 MG PO TABS: 2.5000 mg | ORAL_TABLET | Freq: Every day | ORAL | Status: DC

## 2012-08-10 MED ORDER — ONDANSETRON HCL 4 MG/2ML IJ SOLN
4.0000 mg | Freq: Once | INTRAMUSCULAR | Status: AC
  Administered 2012-08-10: 4 mg via INTRAVENOUS
  Filled 2012-08-10: qty 2

## 2012-08-10 MED ORDER — ACETAMINOPHEN 325 MG PO TABS
650.0000 mg | ORAL_TABLET | Freq: Once | ORAL | Status: AC
  Administered 2012-08-10: 650 mg via ORAL
  Filled 2012-08-10: qty 2

## 2012-08-10 MED ORDER — DEXTROSE 5 % IV SOLN
1.0000 g | INTRAVENOUS | Status: DC
  Administered 2012-08-11 – 2012-08-12 (×2): 1 g via INTRAVENOUS
  Filled 2012-08-10 (×2): qty 10

## 2012-08-10 MED ORDER — MAGNESIUM OXIDE 400 MG PO TABS: 400.0000 mg | ORAL_TABLET | Freq: Every day | ORAL | Status: DC

## 2012-08-10 MED ORDER — ACETAMINOPHEN 650 MG RE SUPP: 650.0000 mg | Freq: Four times a day (QID) | RECTAL | Status: DC | PRN

## 2012-08-10 MED ORDER — ALLOPURINOL 100 MG PO TABS
100.0000 mg | ORAL_TABLET | Freq: Every day | ORAL | Status: DC
  Administered 2012-08-10 – 2012-08-12 (×3): 100 mg via ORAL
  Filled 2012-08-10 (×3): qty 1

## 2012-08-10 MED ORDER — METOPROLOL TARTRATE 50 MG PO TABS
50.0000 mg | ORAL_TABLET | Freq: Two times a day (BID) | ORAL | Status: DC
  Administered 2012-08-10 – 2012-08-12 (×5): 50 mg via ORAL
  Filled 2012-08-10 (×6): qty 1

## 2012-08-10 MED ORDER — MAGNESIUM OXIDE 400 (241.3 MG) MG PO TABS
400.0000 mg | ORAL_TABLET | Freq: Every day | ORAL | Status: DC
  Administered 2012-08-10 – 2012-08-12 (×3): 400 mg via ORAL
  Filled 2012-08-10 (×3): qty 1

## 2012-08-10 MED ORDER — HYDROMORPHONE HCL PF 1 MG/ML IJ SOLN
1.0000 mg | Freq: Once | INTRAMUSCULAR | Status: AC
  Administered 2012-08-10: 1 mg via INTRAVENOUS
  Filled 2012-08-10: qty 1

## 2012-08-10 MED ORDER — MORPHINE SULFATE 2 MG/ML IJ SOLN
1.0000 mg | INTRAMUSCULAR | Status: DC | PRN
  Administered 2012-08-10: 2 mg via INTRAVENOUS
  Filled 2012-08-10: qty 1

## 2012-08-10 NOTE — ED Provider Notes (Addendum)
History    71 year old male with fever and general malaise since last night. Patient has a history of posterior uretheral stricture and intermittently self caths himself. Noticed some bleeding when he catheterized himself last night.  Subjective fever and chills since yesterday evening. Feels generally weak and tired. No chest pain or shortness of breath. No nausea or vomiting. No sick contacts.  CSN: [id number]  Arrival date & time 08/10/12  0443   First MD Initiated Contact with Patient 08/10/12 [id number]      Chief Complaint  Patient presents with  . Fever  . Chills    (Consider location/radiation/quality/duration/timing/severity/associated sxs/prior treatment) HPI  Past Medical History  Diagnosis Date  . HTN (hypertension)   . History of prostate cancer     S/P RADICAL PROSTATECTOMY  . Nephrolithiasis   . Stricture of urethra SELF-CATH AS NEEDED    S/P MULTIPLE DILATION'S   . Bladder neck contracture     POST TUI AND UROLUM STENTS  . Status post carotid endarterectomy LEFT  . Myocardial infarction 2006    S/P PTCA W/ X2 DE STENTS  . Status post primary angioplasty with coronary stent 2006-- MI    X2 DRUG-ELUTING STENTS  . CAD (coronary artery disease) CARDIOLOGIST- DR Riley Kill- VISIT NOTE 09-08-11 IN EPIC    LAST STRESS TEST 2006 ABNORMAL  . DDD (degenerative disc disease)   . History of gout PT STATES STABLE  . GERD (gastroesophageal reflux disease)     CONTROLLED W/ PRILOSEC AND ZANTAC  . Impaired hearing BILATERAL AIDS  . Insomnia   . S/P coronary artery bypass graft x 3   . Peripheral neuropathy FEET  . Foley catheter in place   . Urinary retention     Past Surgical History  Procedure Date  . Cysto w/ balloon dilation urethral stricture 05-19-2011  . Transurethral vaporization bladder neck contracture / dilatation urethral stricture 12-26-2010  . Coronary artery bypass graft 2000    X3  . Prostatectomy 1996  . Cystoscopy w/ internal urethrotomy 2003      X2--    W/ UROLUME PROSTHESIS  . Percutaneous nephrotomy 1989  . Extracorporeal shock wave lithotripsy X5  . Urethral dilatation's MULTIPLE   . Coronary angioplasty 2006    X2 DRUG-ELUTING STENTS  . Cardiac catheterization 12-18-2010 AND 2008  . Carotid endarterectomy 2002    LEFT  . Cystoscopy with urethral dilatation 11/27/2011    Procedure: CYSTOSCOPY WITH URETHRAL DILATATION;  Surgeon: Kathi Ludwig, MD;  Location: [facility];  Service: Urology;  Laterality: N/A;  ROOM # 2 REQUESTED (COOK)   . Cystoscopy with urethral dilatation 02/26/2012    Procedure: CYSTOSCOPY WITH URETHRAL DILATATION;  Surgeon: Kathi Ludwig, MD;  Location: [facility];  Service: Urology;  Laterality: N/A;  cysto, urethral dilation and possible holmium laser of stricture c arm, laser  . Transurethral resection of prostate 02/26/2012    Procedure: TRANSURETHRAL RESECTION OF THE PROSTATE (TURP);  Surgeon: Kathi Ludwig, MD;  Location: [facility];  Service: Urology;;    Family History  Problem Relation Age of Onset  . Hypertension      History  Substance Use Topics  . Smoking status: Former Smoker    Types: Cigarettes    Quit date: 11/24/1968  . Smokeless tobacco: Never Used  . Alcohol Use: No      Review of Systems   Review of symptoms negative unless otherwise noted in HPI.   Allergies  Naproxen sodium and Statins  Home Medications   Current Outpatient Rx  Name Route Sig Dispense Refill  . ALLOPURINOL 100 MG PO TABS Oral Take 100 mg by mouth daily at 12 noon.     . ASPIRIN EC 81 MG PO TBEC Oral Take 81 mg by mouth every morning.    Marland Kitchen CETIRIZINE HCL 10 MG PO TABS Oral Take 10 mg by mouth daily as needed. For allergies    . DIAZEPAM 5 MG PO TABS Oral Take 5 mg by mouth every 6 (six) hours as needed. For anxiety and procedures    . GABAPENTIN 100 MG PO CAPS Oral Take 100-200 mg by mouth 4 (four) times daily. 1 tab in the morning, 1 tab at  lunch, 1 tab in the evening, and 2 tabs at bedtime    . HYDROCHLOROTHIAZIDE 25 MG PO TABS Oral Take 12.5 mg by mouth 2 (two) times daily. Take one-half by mouth twice a day    . HYDROCODONE-ACETAMINOPHEN 5-500 MG PO TABS Oral Take 1 tablet by mouth every 6 (six) hours as needed. For pain    . LISINOPRIL 2.5 MG PO TABS Oral Take 2.5 mg by mouth daily at 12 noon.    Marland Kitchen MAGNESIUM OXIDE 400 MG PO TABS Oral Take 400 mg by mouth daily at 12 noon.     Marland Kitchen METOPROLOL TARTRATE 50 MG PO TABS Oral Take 1 tablet (50 mg total) by mouth 2 (two) times daily. 12 tablet 1  . NITROGLYCERIN 0.4 MG SL SUBL Sublingual Place 0.4 mg under the tongue every 5 (five) minutes x 3 doses as needed. For chest pain    . OMEPRAZOLE 20 MG PO CPDR Oral Take 20 mg by mouth every morning.     Marland Kitchen OVER THE COUNTER MEDICATION Oral Take 2 tablets by mouth daily. Green miracle supplement    . POTASSIUM CHLORIDE CRYS ER 20 MEQ PO TBCR Oral Take 0.5 tablets (10 mEq total) by mouth 2 (two) times daily. 90 tablet 3  . RANITIDINE HCL 150 MG PO CAPS Oral Take 150 mg by mouth at bedtime.       BP 118/69  Pulse 107  Temp 100.7 F (38.2 C) (Oral)  Resp 26  SpO2 95%  Physical Exam  Nursing note and vitals reviewed. Constitutional: He appears well-developed and well-nourished.       Tired appearing  HENT:  Head: Normocephalic and atraumatic.  Eyes: Conjunctivae normal are normal. Right eye exhibits no discharge. Left eye exhibits no discharge.  Neck: Neck supple.  Cardiovascular: Normal rate, regular rhythm and normal heart sounds.  Exam reveals no gallop and no friction rub.   No murmur heard.      Tachycardic with a regular rhythm  Pulmonary/Chest: Effort normal and breath sounds normal. No respiratory distress.  Abdominal: Soft. He exhibits no distension. There is no tenderness.  Genitourinary:       No CVA tenderness  Musculoskeletal: He exhibits no edema and no tenderness.  Neurological: He is alert.  Skin: Skin is warm. He is  diaphoretic.  Psychiatric: He has a normal mood and affect. His behavior is normal. Thought content normal.    ED Course  Procedures (including critical care time)  Labs Reviewed  URINALYSIS, ROUTINE W REFLEX MICROSCOPIC - Abnormal; Notable for the following:    APPearance CLOUDY (*)     Hgb urine dipstick LARGE (*)     Leukocytes, UA LARGE (*)     All other components within normal limits  CBC WITH DIFFERENTIAL - Abnormal;  Notable for the following:    WBC 13.3 (*)     Hemoglobin 11.7 (*)     HCT 37.9 (*)     MCV 72.6 (*)     MCH 22.4 (*)     RDW 19.1 (*)     Neutrophils Relative 88 (*)     Neutro Abs 11.7 (*)     Lymphocytes Relative 6 (*)     All other components within normal limits  BASIC METABOLIC PANEL - Abnormal; Notable for the following:    Sodium 133 (*)     Chloride 95 (*)     Glucose, Bld 130 (*)     GFR calc non Af Amer 58 (*)     GFR calc Af Amer 67 (*)     All other components within normal limits  URINE MICROSCOPIC-ADD ON - Abnormal; Notable for the following:    Bacteria, UA MANY (*)     All other components within normal limits  URINE CULTURE  CULTURE, BLOOD (ROUTINE X 2)  CULTURE, BLOOD (ROUTINE X 2)  PROCALCITONIN  LACTIC ACID, PLASMA   No results found.   1. Sepsis   2. UTI (urinary tract infection)       MDM  71 year old male with fever and general malaise. Urinalysis is consistent with infection. Hx of obstructive GU pathology. Mildy tachycardic and tachypneic, but BP ok. Dose of ceftriaxone was administered. Will admit.        Raeford Razor, MD 08/10/12 1914  Raeford Razor, MD 08/10/12 [phone]

## 2012-08-10 NOTE — Progress Notes (Signed)
   CARE MANAGEMENT NOTE 08/10/2012  Patient:  Caleb Berry, Caleb Berry   Account Number:  [id number]  Date Initiated:  08/10/2012  Documentation initiated by:  Jiles Crocker  Subjective/Objective Assessment:   ADMITTED WITH SEPSIS, UTI     Action/Plan:   PCP: [name], MD  LIVES AT HOME WITH SPOUSE; AWAITING FOR PT/OT EVALS   Anticipated DC Date:  08/17/2012   Anticipated DC Plan:  HOME W HOME HEALTH SERVICES      DC Planning Services  CM consult            Status of service:  In process, will continue to follow Medicare Important Message given?  NA - LOS <3 / Initial given by admissions (If response is "NO", the following Medicare IM given date fields will be blank)  Per UR Regulation:  Reviewed for med. necessity/level of care/duration of stay   Comments:  08/10/2012- B Chaka Boyson RN, BSN, MHA

## 2012-08-10 NOTE — ED Notes (Signed)
Pt c/o fever, and chills. Pt has a history of urinary problems. Pt was passing clots, pt was recommended to see [facility] in the morning. Pt states he could not wait because of his chills. Skin is warm and dry

## 2012-08-10 NOTE — Consult Note (Signed)
Urology Consult  Referring physician: Dr. 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 He then developed urethral stricture,and has had multiple dilations, and incisions since. He has abnormal urodynamics, wit poor voiding pattern, and has been successful in learning SIC technique so he can empty bladder. He has had recurrent UTI, however, and has a hx of multi-drug resistant uti in the past. He has been doing well with prn urethral office dilations, until returning from a recent trip to [place], [place], when he stopped cathing-because he was doing so well, but developed recurrent stricture, wth passage of tissue and clot. He then developed fever  And chills, and was admitted for IV Rocephin and c/s.   Past Medical History  Diagnosis Date  . HTN (hypertension)   . History of prostate cancer     S/P RADICAL PROSTATECTOMY  . Nephrolithiasis   . Stricture of urethra SELF-CATH AS NEEDED    S/P MULTIPLE DILATION'S   . Bladder neck contracture     POST TUI AND UROLUM STENTS  . Status post carotid endarterectomy LEFT  . Myocardial infarction 2006    S/P PTCA W/ X2 DE STENTS  . Status post primary angioplasty with coronary stent 2006-- MI    X2 DRUG-ELUTING STENTS  . CAD (coronary artery disease) CARDIOLOGIST- DR Riley Kill- VISIT NOTE 09-08-11 IN EPIC    LAST STRESS TEST 2006 ABNORMAL  . DDD (degenerative disc disease)   . History of gout PT STATES STABLE  . GERD (gastroesophageal reflux disease)     CONTROLLED W/ PRILOSEC AND ZANTAC  . Impaired hearing BILATERAL AIDS  . Insomnia   . S/P coronary artery bypass graft x 3   . Peripheral neuropathy FEET  . Foley catheter in  place   . Urinary retention    Past Surgical History  Procedure Date  . Cysto w/ balloon dilation urethral stricture 05-19-2011  . Transurethral vaporization bladder neck contracture / dilatation urethral stricture 12-26-2010  . Coronary artery bypass graft 2000    X3  . Prostatectomy 1996  . Cystoscopy w/ internal urethrotomy 2003      X2--   W/ UROLUME PROSTHESIS  . Percutaneous nephrotomy 1989  . Extracorporeal shock wave lithotripsy X5  . Urethral dilatation's MULTIPLE   . Coronary angioplasty 2006    X2 DRUG-ELUTING STENTS  . Cardiac catheterization 12-18-2010 AND 2008  . Carotid endarterectomy 2002    LEFT  . Cystoscopy with urethral dilatation 11/27/2011    Procedure: CYSTOSCOPY WITH URETHRAL DILATATION;  Surgeon: Kathi Ludwig, MD;  Location: [facility];  Service: Urology;  Laterality: N/A;  ROOM # 2 REQUESTED ([facility])   . Cystoscopy with urethral dilatation 02/26/2012    Procedure: CYSTOSCOPY WITH URETHRAL DILATATION;  Surgeon: Kathi Ludwig, MD;  Location: [facility];  Service: Urology;  Laterality: N/A;  cysto, urethral dilation and possible holmium laser of stricture c arm, laser  . Transurethral resection of prostate 02/26/2012    Procedure: TRANSURETHRAL RESECTION OF THE PROSTATE (TURP);  Surgeon: Kathi Ludwig, MD;  Location: [facility];  Service: Urology;;    Medications: I have reviewed the patient's current medications. Allergies:  Allergies  Allergen Reactions  . Naproxen Sodium Anaphylaxis and Shortness Of Breath  Reaction: Heart attack symptoms  . Statins Other (See Comments)    SEVERE MUSCLE ACHES/ JOINT PAIN    Family History  Problem Relation Age of Onset  . Hypertension     Social History:  reports that he quit smoking about 43 years ago. His smoking use included Cigarettes. He has never used smokeless tobacco. He reports that he does not drink alcohol or use illicit drugs.  ROS: All  systems are reviewed and negative except as noted. Chills. No nausea/vomiting. Voiding spontaneously.   Physical Exam:  Vital signs in last 24 hours: Temp:  [98 F (36.7 C)-100.7 F (38.2 C)] 98.6 F (37 C) (09/17 1448) Pulse Rate:  [81-122] 81  (09/17 1414) Resp:  [16-26] 18  (09/17 1414) BP: (117-134)/(60-82) 117/62 mmHg (09/17 1414) SpO2:  [95 %-99 %] 99 % (09/17 1414) Weight:  [78.9 kg (173 lb 15.1 oz)] 78.9 kg (173 lb 15.1 oz) (09/17 1045)  Cardiovascular: Skin warm;  Flushed. Respiratory: Breaths quiet; no shortness of breath Abdomen: No masses Neurological: Normal sensation to touch Musculoskeletal: Normal motor function arms and legs Lymphatics: No inguinal adenopathy Skin: No rashes Genitourinary:normal appearing penis. No catheter.   Laboratory Data:  Results for orders placed during the hospital encounter of 08/10/12 (from the past 72 hour(s))  CBC WITH DIFFERENTIAL     Status: Abnormal   Collection Time   08/10/12  6:05 AM      Component Value Range Comment   WBC 13.3 (*) 4.0 - 10.5 K/uL    RBC 5.22  4.22 - 5.81 MIL/uL    Hemoglobin 11.7 (*) 13.0 - 17.0 g/dL    HCT 16.1 (*) 09.6 - 52.0 %    MCV 72.6 (*) 78.0 - 100.0 fL    MCH 22.4 (*) 26.0 - 34.0 pg    MCHC 30.9  30.0 - 36.0 g/dL    RDW 04.5 (*) 40.9 - 15.5 %    Platelets 267  150 - 400 K/uL    Neutrophils Relative 88 (*) 43 - 77 %    Neutro Abs 11.7 (*) 1.7 - 7.7 K/uL    Lymphocytes Relative 6 (*) 12 - 46 %    Lymphs Abs 0.8  0.7 - 4.0 K/uL    Monocytes Relative 5  3 - 12 %    Monocytes Absolute 0.7  0.1 - 1.0 K/uL    Eosinophils Relative 1  0 - 5 %    Eosinophils Absolute 0.1  0.0 - 0.7 K/uL    Basophils Relative 0  0 - 1 %    Basophils Absolute 0.0  0.0 - 0.1 K/uL   BASIC METABOLIC PANEL     Status: Abnormal   Collection Time   08/10/12  6:05 AM      Component Value Range Comment   Sodium 133 (*) 135 - 145 mEq/L    Potassium 3.8  3.5 - 5.1 mEq/L    Chloride 95 (*) 96 - 112 mEq/L    CO2 25  19 - 32  mEq/L    Glucose, Bld 130 (*) 70 - 99 mg/dL    BUN 18  6 - 23 mg/dL    Creatinine, Ser 8.11  0.50 - 1.35 mg/dL    Calcium 9.4  8.4 - 91.4 mg/dL    GFR calc non Af Amer 58 (*) >90 mL/min    GFR calc Af Amer 67 (*) >90 mL/min   MAGNESIUM     Status: Abnormal   Collection Time   08/10/12  6:05 AM      Component Value Range Comment   Magnesium 1.4 (*) 1.5 - 2.5 mg/dL   URINALYSIS, ROUTINE W REFLEX MICROSCOPIC     Status: Abnormal   Collection Time   08/10/12  6:14 AM      Component Value Range Comment   Color, Urine YELLOW  YELLOW    APPearance CLOUDY (*) CLEAR    Specific Gravity, Urine 1.018  1.005 - 1.030    pH 7.0  5.0 - 8.0    Glucose, UA NEGATIVE  NEGATIVE mg/dL    Hgb urine dipstick LARGE (*) NEGATIVE    Bilirubin Urine NEGATIVE  NEGATIVE    Ketones, ur NEGATIVE  NEGATIVE mg/dL    Protein, ur NEGATIVE  NEGATIVE mg/dL    Urobilinogen, UA 0.2  0.0 - 1.0 mg/dL    Nitrite NEGATIVE  NEGATIVE    Leukocytes, UA LARGE (*) NEGATIVE   URINE MICROSCOPIC-ADD ON     Status: Abnormal   Collection Time   08/10/12  6:14 AM      Component Value Range Comment   Squamous Epithelial / LPF RARE  RARE    WBC, UA TOO NUMEROUS TO COUNT  <3 WBC/hpf    RBC / HPF TOO NUMEROUS TO COUNT  <3 RBC/hpf    Bacteria, UA MANY (*) RARE   PROCALCITONIN     Status: Normal   Collection Time   08/10/12  8:35 AM      Component Value Range Comment   Procalcitonin 0.73     LACTIC ACID, PLASMA     Status: Abnormal   Collection Time   08/10/12  8:35 AM      Component Value Range Comment   Lactic Acid, Venous 3.3 (*) 0.5 - 2.2 mmol/L    No results found for this or any previous visit (from the past 240 hour(s)). Creatinine:  Basename 08/10/12 0605  CREATININE 1.22    Xrays: See report/chart   Impression/Assessment:  Recurrent proximal urethral stricture, and UTI, and urosepsis. He is on IV Rocephin, as before, while cultures are obtained. He may need ID consult. He refuses foley catheter now, because he is  abel to spontaneously void. He is able to self cath if necessary. If not then he may need 14 F foley cath.   Plan:  Will follow with [facility]. Await cultures. May need ID consult. Possible foley cath id he is unable to void or self cath.   Caleb Berry I 08/10/2012, 5:36 PM

## 2012-08-10 NOTE — Progress Notes (Signed)
71 year old male with fever and general malaise since last night. Patient has a history of posterior uretheral stricture and intermittently self caths himself. Has had some hematuria. Has also had Subjective fever and chills since yesterday evening. Feels generally weak and tired. No chest pain or shortness of breath. No nausea or vomiting. No sick contacts.  Found to have a urinary tract infection Mildly tachycardic and therefore being admitted to telemetry

## 2012-08-10 NOTE — ED Notes (Signed)
Pt was able to void 200 cc of cloudy urine in urinal  Pt states he did not have burning or pain as he had before

## 2012-08-10 NOTE — ED Notes (Signed)
Pt has history of urethral dilatation in august; requires urethral stent changes every few months with last one in June; self caths as needed; tonight used a 16 fr catheter that he thinks may have gotten kinked and damaged his urethra; started passing clots after kinked the 16 fr catheter around 1900 last night; developed chills at 2 am; passed large "chunk" this morning; states has been able to pass a 14 fr catheter with pain requiring self application of xylocaine after cath/urinating; c/o arthritis pain in his back

## 2012-08-10 NOTE — H&P (Signed)
[facility] History and Physical  Caleb Berry ZOX:[id number] DOB: Feb 28, 1941 DOA: 08/10/2012  Referring physician: Dr Juleen China PCP: [name], MD   Chief Complaint: Fever/ chills  HPI: Caleb Berry is a 71 y.o. male with a complex urological history which includes history of prostate cancer status post radical prostatectomy, nephrolithiasis, bladder neck contracture, history of urethral strictures with dilatation of the urethra every 3 months with some cauterized patient of tissue per patient, history of coronary artery disease status post drug-eluting stents x2, history of coronary or CABG who presents to the ED with a several hour history of fever and chills. Patient states that he had a cystourethroscopy with urethral dilatation and transurethral resection of posterior urethral stricture done 02/26/2012 per Dr. Patsi Sears. Patient stated that he had his urethra dilated again in 05/23/2012. Patient stated that he went to [facility] 07/20/2012 however week prior to that had another dilatation done. Patient states that on his trip to [facility] he catheterized himself daily however over the past weekend prior to admission he started catheterizing himself every other day using the 14-gauge right. Patient stated that the night prior to admission around 7 PM he tried a 16 8 French catheter and felt like it went all the way through however he felt it may have curled as when he was pulling it out he noticed some blood clots and had some hematuria that lasted about 5-10 minutes. Patient stated that an hour and a half later he tried to put in a 14 8 French catheter and noted a clot as well as a possible scar tissue. Patient said that he called urology on-call the night prior to admission and was told that if he had some persistent bleeding to present to the office. Patient stated that however at 2 AM on the day of admission he had a fever chills and what he describes as a model riders with a temp up as  high as 101 and subsequently presented to the ED. Patient does endorse some nausea. Patient denied any chest pain, no shortness of breath, no abdominal pain, no diarrhea, no constipation, no headaches, patient does endorse a chronic cough and generalized weakness. Patient stated that he was seen in the ED given IV antibiotics and told he had a urinary superinfection. We were called to admit the patient for further evaluation and management.   Review of Systems: The patient denies anorexia, fever, weight loss,, vision loss, decreased hearing, hoarseness, chest pain, syncope, dyspnea on exertion, peripheral edema, balance deficits, hemoptysis, abdominal pain, melena, hematochezia, severe indigestion/heartburn, hematuria, incontinence, genital sores, muscle weakness, suspicious skin lesions, transient blindness, difficulty walking, depression, unusual weight change, abnormal bleeding, enlarged lymph nodes, angioedema, and breast masses.   Past Medical History  Diagnosis Date  . HTN (hypertension)   . History of prostate cancer     S/P RADICAL PROSTATECTOMY  . Nephrolithiasis   . Stricture of urethra SELF-CATH AS NEEDED    S/P MULTIPLE DILATION'S   . Bladder neck contracture     POST TUI AND UROLUM STENTS  . Status post carotid endarterectomy LEFT  . Myocardial infarction 2006    S/P PTCA W/ X2 DE STENTS  . Status post primary angioplasty with coronary stent 2006-- MI    X2 DRUG-ELUTING STENTS  . CAD (coronary artery disease) CARDIOLOGIST- DR Riley Kill- VISIT NOTE 09-08-11 IN EPIC    LAST STRESS TEST 2006 ABNORMAL  . DDD (degenerative disc disease)   . History of gout PT STATES STABLE  . GERD (  gastroesophageal reflux disease)     CONTROLLED W/ PRILOSEC AND ZANTAC  . Impaired hearing BILATERAL AIDS  . Insomnia   . S/P coronary artery bypass graft x 3   . Peripheral neuropathy FEET  . Foley catheter in place   . Urinary retention    Past Surgical History  Procedure Date  . Cysto w/ balloon  dilation urethral stricture 05-19-2011  . Transurethral vaporization bladder neck contracture / dilatation urethral stricture 12-26-2010  . Coronary artery bypass graft 2000    X3  . Prostatectomy 1996  . Cystoscopy w/ internal urethrotomy 2003      X2--   W/ UROLUME PROSTHESIS  . Percutaneous nephrotomy 1989  . Extracorporeal shock wave lithotripsy X5  . Urethral dilatation's MULTIPLE   . Coronary angioplasty 2006    X2 DRUG-ELUTING STENTS  . Cardiac catheterization 12-18-2010 AND 2008  . Carotid endarterectomy 2002    LEFT  . Cystoscopy with urethral dilatation 11/27/2011    Procedure: CYSTOSCOPY WITH URETHRAL DILATATION;  Surgeon: Kathi Ludwig, MD;  Location: [facility];  Service: Urology;  Laterality: N/A;  ROOM # 2 REQUESTED (COOK)   . Cystoscopy with urethral dilatation 02/26/2012    Procedure: CYSTOSCOPY WITH URETHRAL DILATATION;  Surgeon: Kathi Ludwig, MD;  Location: [facility];  Service: Urology;  Laterality: N/A;  cysto, urethral dilation and possible holmium laser of stricture c arm, laser  . Transurethral resection of prostate 02/26/2012    Procedure: TRANSURETHRAL RESECTION OF THE PROSTATE (TURP);  Surgeon: Kathi Ludwig, MD;  Location: [facility];  Service: Urology;;   Social History:  reports that he quit smoking about 43 years ago. His smoking use included Cigarettes. He has never used smokeless tobacco. He reports that he does not drink alcohol or use illicit drugs.  Allergies  Allergen Reactions  . Naproxen Sodium Anaphylaxis and Shortness Of Breath    Reaction: Heart attack symptoms  . Statins Other (See Comments)    SEVERE MUSCLE ACHES/ JOINT PAIN    Family History  Problem Relation Age of Onset  . Hypertension      Prior to Admission medications   Medication Sig Start Date End Date Taking? Authorizing Provider  allopurinol (ZYLOPRIM) 100 MG tablet Take 100 mg by mouth daily at 12 noon.   12/31/10  Yes Historical Provider, MD  aspirin EC 81 MG tablet Take 81 mg by mouth every morning.   Yes Historical Provider, MD  cetirizine (ZYRTEC) 10 MG tablet Take 10 mg by mouth daily as needed. For allergies   Yes Historical Provider, MD  diazepam (VALIUM) 5 MG tablet Take 5 mg by mouth every 6 (six) hours as needed. For anxiety and procedures   Yes Historical Provider, MD  gabapentin (NEURONTIN) 100 MG capsule Take 100-200 mg by mouth 4 (four) times daily. 1 tab in the morning, 1 tab at lunch, 1 tab in the evening, and 2 tabs at bedtime 12/31/10  Yes Historical Provider, MD  hydrochlorothiazide (HYDRODIURIL) 25 MG tablet Take 12.5 mg by mouth 2 (two) times daily. Take one-half by mouth twice a day 09/08/11  Yes Herby Abraham, MD  HYDROcodone-acetaminophen (VICODIN) 5-500 MG per tablet Take 1 tablet by mouth every 6 (six) hours as needed. For pain   Yes Historical Provider, MD  lisinopril (PRINIVIL,ZESTRIL) 2.5 MG tablet Take 2.5 mg by mouth daily at 12 noon. 03/10/12  Yes Herby Abraham, MD  magnesium oxide (MAG-OX) 400 MG tablet Take 400 mg by  mouth daily at 12 noon.  03/07/11  Yes Herby Abraham, MD  metoprolol (LOPRESSOR) 50 MG tablet Take 1 tablet (50 mg total) by mouth 2 (two) times daily. 03/17/12  Yes Herby Abraham, MD  nitroGLYCERIN (NITROSTAT) 0.4 MG SL tablet Place 0.4 mg under the tongue every 5 (five) minutes x 3 doses as needed. For chest pain 09/08/11 09/07/12 Yes Herby Abraham, MD  omeprazole (PRILOSEC) 20 MG capsule Take 20 mg by mouth every morning.    Yes Historical Provider, MD  OVER THE COUNTER MEDICATION Take 2 tablets by mouth daily. Green miracle supplement   Yes Historical Provider, MD  potassium chloride SA (K-DUR,KLOR-CON) 20 MEQ tablet Take 0.5 tablets (10 mEq total) by mouth 2 (two) times daily. 03/10/12  Yes Herby Abraham, MD  ranitidine (ZANTAC) 150 MG capsule Take 150 mg by mouth at bedtime.    Yes Historical Provider, MD   Physical Exam: Filed Vitals:    08/10/12 0513 08/10/12 0547 08/10/12 1010 08/10/12 1044  BP: 118/69  117/60 134/70  Pulse: 107  88 91  Temp:  100.7 F (38.2 C) 98 F (36.7 C) 98.4 F (36.9 C)  TempSrc:   Oral Oral  Resp: 26  16 18   SpO2: 95%  97% 99%     General:  NAD. Well-developed well-nourished in no acute cardiopulmonary distress.  Eyes: Pupils equal round and reactive to light and accommodation. Extraocular movements intact.  ENT: Oropharynx is clear, no lesions, no exudates.  Neck: Supple with no lymphadenopathy. No JVD.  Cardiovascular: Regular rate rhythm no murmurs rubs or gallops. No lower extremity edema.  Respiratory: Clear to auscultation bilaterally. No wheezes, no crackles, no rhonchi.  Abdomen: Soft, nontender, nondistended, positive bowel sounds.  Skin: No lesions or rashes noted.  Musculoskeletal: 5 out of 5 bilateral upper extremity strength. 5 out of 5 bilateral lower extremity strength.  Psychiatric: Jovial. Normal mood normal affect good insight good judgment  Neurologic: Alert and oriented x3. Cranial nerves II through XII are grossly intact. No focal deficits.  Labs on Admission:  Basic Metabolic Panel:  Lab 08/10/12 1610  NA 133*  K 3.8  CL 95*  CO2 25  GLUCOSE 130*  BUN 18  CREATININE 1.22  CALCIUM 9.4  MG --  PHOS --   Liver Function Tests: No results found for this basename: AST:5,ALT:5,ALKPHOS:5,BILITOT:5,PROT:5,ALBUMIN:5 in the last 168 hours No results found for this basename: LIPASE:5,AMYLASE:5 in the last 168 hours No results found for this basename: AMMONIA:5 in the last 168 hours CBC:  Lab 08/10/12 0605  WBC 13.3*  NEUTROABS 11.7*  HGB 11.7*  HCT 37.9*  MCV 72.6*  PLT 267   Cardiac Enzymes: No results found for this basename: CKTOTAL:5,CKMB:5,CKMBINDEX:5,TROPONINI:5 in the last 168 hours  BNP (last 3 results) No results found for this basename: PROBNP:3 in the last 8760 hours CBG: No results found for this basename: GLUCAP:5 in the last 168  hours  Radiological Exams on Admission: No results found.  EKG: None  Assessment/Plan Principal Problem:  *Sepsis Active Problems:  HYPERCHOLESTEROLEMIA  IIA  HYPERTENSION  CAD  UTI (urinary tract infection)  GERD (gastroesophageal reflux disease)  Hematuria  #1 sepsis Likely secondary to UTI. Patient does have a complicated urological history does get urethral dilatations done about every 3 months. Patient did have an elevated lactic acid level. And was tachycardic on admission. With a white count greater than 12,000. Patient's tachycardia has somewhat improved and his blood pressure is stable. Blood cultures pending.  Urine cultures are pending. We'll continue IV fluids, and supportive care. We'll place on IV Rocephin. We'll consult with urology for further evaluation and management.  #2 urinary tract infection Urine cultures are pending. Will place empirically on IV Rocephin. Due to patient's complicated urological history will consult with urology for further evaluation and management.  #3 hypertension Will resume home regimen of lisinopril, metoprolol. Will hold patient's HCTZ for now and resume in the next one to 2 days.  #4 hematuria Likely secondary to trauma as the patient stated that he tried a [place] and he thought he may have coiled on him. We'll monitor for now. Urology consultation is pending.  #5 gastroesophageal reflux disease PPI.  #6 coronary artery disease Stable. Will continue patient's aspirin, lisinopril, metoprolol. Will hold patient's HCTZ and resume in about one to 2 days.  #6 hyperlipidemia Patient is very intolerant to the statins.  #7 prophylaxis PPI for GI prophylaxis, SCDs for DVT prophylaxis.  Code Status: Full Family Communication: Updated patient and wife at bedside. Disposition Plan: Admit to telemetry  Time spent: 60 mins  [facility] Pager [phone]  If 7PM-7AM, please contact  night-coverage [email or web address] Password [facility] 08/10/2012, 12:15 PM

## 2012-08-11 LAB — CBC
Platelets: 225 10*3/uL (ref 150–400)
RDW: 19.7 % — ABNORMAL HIGH (ref 11.5–15.5)
WBC: 6.6 10*3/uL (ref 4.0–10.5)

## 2012-08-11 LAB — DIFFERENTIAL
Basophils Absolute: 0 10*3/uL (ref 0.0–0.1)
Basophils Relative: 1 % (ref 0–1)
Eosinophils Absolute: 0 10*3/uL (ref 0.0–0.7)
Monocytes Relative: 5 % (ref 3–12)
Neutrophils Relative %: 82 % — ABNORMAL HIGH (ref 43–77)

## 2012-08-11 LAB — COMPREHENSIVE METABOLIC PANEL
Alkaline Phosphatase: 48 U/L (ref 39–117)
BUN: 15 mg/dL (ref 6–23)
Chloride: 99 mEq/L (ref 96–112)
GFR calc Af Amer: 65 mL/min — ABNORMAL LOW (ref >90)
GFR calc non Af Amer: 56 mL/min — ABNORMAL LOW (ref >90)
Glucose, Bld: 127 mg/dL — ABNORMAL HIGH (ref 70–99)
Potassium: 3.6 mEq/L (ref 3.5–5.1)
Total Bilirubin: 0.2 mg/dL — ABNORMAL LOW (ref 0.3–1.2)

## 2012-08-11 LAB — PROCALCITONIN: Procalcitonin: 1.58 ng/mL

## 2012-08-11 LAB — URINE CULTURE
Colony Count: NO GROWTH
Culture: NO GROWTH

## 2012-08-11 LAB — LACTIC ACID, PLASMA: Lactic Acid, Venous: 1.4 mmol/L (ref 0.5–2.2)

## 2012-08-11 NOTE — Progress Notes (Signed)
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 He then developed urethral stricture,and has had multiple dilations, and incisions since. He has abnormal urodynamics, wit poor voiding pattern, and has been successful in learning SIC technique so he can empty bladder. He has had recurrent UTI, however, and has a hx of multi-drug resistant uti in the past.  Past medical history-As per Problem list  Consultants:  Dr Meda Klinefelter  Antibiotics:  Rocephin 9/17   Subjective  Feels well. Wife at bedside. No further issues. No chills no fever no dysuria at present.   Objective    Interim History: Nursing reports a good day  Telemetry: Some rune of PSVT this am 0940  Objective: Filed Vitals:   08/11/12 0210 08/11/12 0547 08/11/12 1038 08/11/12 1423  BP:  132/69 115/74 104/51  Pulse:  77  68  Temp: 98.3 F (36.8 C) 98.6 F (37 C)  98 F (36.7 C)  TempSrc:  Oral  Oral  Resp:  17  18  Height:      Weight:  76.885 kg (169 lb 8 oz)    SpO2:  98%  98%    Intake/Output Summary (Last 24 hours) at 08/11/12 1826 Last data filed at 08/11/12 1500  Gross per 24 hour  Intake 2148.75 ml  Output   1300 ml  Net 848.75 ml    Exam:  General: NAD. Well-developed well-nourished in no acute cardiopulmonary distress. Eyes: Pupils equal round and reactive to light and accommodation. Extraocular movements intact. ENT: Oropharynx is clear, no lesions, no exudates. Neck: Supple with no lymphadenopathy. No JVD. Cardiovascular: Regular rate rhythm no murmurs rubs or gallops. No lower extremity edema. Respiratory: Clear to auscultation bilaterally. No wheezes, no crackles, no rhonchi. Abdomen: Soft, nontender, nondistended,  positive bowel sounds.   Data Reviewed: Basic Metabolic Panel:  Lab 08/11/12 9562 08/10/12 0605  NA 135 133*  K 3.6 3.8  CL 99 95*  CO2 25 25  GLUCOSE 127* 130*  BUN 15 18  CREATININE 1.25 1.22  CALCIUM 8.7 9.4  MG -- 1.4*  PHOS -- --   Liver Function Tests:  Lab 08/11/12 0443  AST 17  ALT 20  ALKPHOS 48  BILITOT 0.2*  PROT 5.9*  ALBUMIN 3.0*   No results found for this basename: LIPASE:5,AMYLASE:5 in the last 168 hours No results found for this basename: AMMONIA:5 in the last 168 hours CBC:  Lab 08/11/12 0443 08/10/12 0605  WBC 6.6 13.3*  NEUTROABS 5.4 11.7*  HGB 10.5* 11.7*  HCT 33.8* 37.9*  MCV 73.2* 72.6*  PLT 225 267   Cardiac Enzymes: No results found for this basename: CKTOTAL:5,CKMB:5,CKMBINDEX:5,TROPONINI:5 in the last 168 hours BNP: No components found with this basename: POCBNP:5 CBG: No results found for this basename: GLUCAP:5 in the last 168 hours  Recent Results (from the past 240 hour(s))  URINE CULTURE     Status: Normal   Collection Time   08/10/12  6:14 AM      Component Value Range Status Comment   Specimen Description URINE, RANDOM   Final    Special Requests NONE   Final    Culture  Setup Time 08/10/2012 12:50   Final    Colony Count NO GROWTH   Final    Culture NO GROWTH  Final    Report Status 08/11/2012 FINAL   Final   CULTURE, BLOOD (ROUTINE X 2)     Status: Normal (Preliminary result)   Collection Time   08/10/12  8:45 AM      Component Value Range Status Comment   Specimen Description BLOOD RIGHT ARM   Final    Special Requests BOTTLES DRAWN AEROBIC AND ANAEROBIC 5 CC EACH   Final    Culture  Setup Time 08/10/2012 12:47   Final    Culture     Final    Value:        BLOOD CULTURE RECEIVED NO GROWTH TO DATE CULTURE WILL BE HELD FOR 5 DAYS BEFORE ISSUING A FINAL NEGATIVE REPORT   Report Status PENDING   Incomplete   CULTURE, BLOOD (ROUTINE X 2)     Status: Normal (Preliminary result)   Collection Time   08/10/12  8:45 AM       Component Value Range Status Comment   Specimen Description BLOOD RIGHT ARM   Final    Special Requests BOTTLES DRAWN AEROBIC ONLY 5 CC   Final    Culture  Setup Time 08/10/2012 12:47   Final    Culture     Final    Value:        BLOOD CULTURE RECEIVED NO GROWTH TO DATE CULTURE WILL BE HELD FOR 5 DAYS BEFORE ISSUING A FINAL NEGATIVE REPORT   Report Status PENDING   Incomplete      Studies:              All Imaging reviewed and is as per above notation   Scheduled Meds:   . sodium chloride   Intravenous STAT  . allopurinol  100 mg Oral Q1200  . aspirin EC  81 mg Oral q morning - 10a  . cefTRIAXone (ROCEPHIN)  IV  1 g Intravenous Q24H  . famotidine  20 mg Oral QHS  . gabapentin  100 mg Oral TID WC  . gabapentin  200 mg Oral QHS  . lisinopril  2.5 mg Oral Daily  . loratadine  10 mg Oral Daily  . magnesium oxide  400 mg Oral Daily  . metoprolol  50 mg Oral BID  . pantoprazole  40 mg Oral Q1200  . pneumococcal 23 valent vaccine  0.5 mL Intramuscular Tomorrow-1000  . sodium chloride  1,000 mL Intravenous Once  . sodium chloride  3 mL Intravenous Q12H   Continuous Infusions:   . sodium chloride 75 mL/hr at 08/11/12 0246     Assessment/Plan: #1 sepsis  Likely secondary to UTI. Patient does have a complicated urological history does get urethral dilatations done about every 3 months. Patient did have an elevated lactic acid level. And was tachycardic on admission. With a white count greater than 12,000. Patient's tachycardia has somewhat improved await blood culture and urine cultures. Continue ceftriaxone monotherapy until urine cultures resolve #2 urinary tract infection Urine cultures are pending. Will place empirically on IV Rocephin. Appreciate neurology input-no need at present for infectious disease followup #3 hypertension  Will resume home regimen of lisinopril, metoprolol. Will hold patient's HCTZ for now and resume in the next one to 2 days.  #4 hematuria  Likely  secondary to trauma as the patient stated that he tried a 70 [place] and he thought he may have coiled on him. We'll monitor for now. Urology consultation is pending.  #5 gastroesophageal reflux disease  PPI.  #6 coronary artery disease  Stable. Will continue  patient's aspirin, lisinopril, metoprolol. Will hold patient's HCTZ and resume in about one to 2 days.  #6 hyperlipidemia  Patient is very intolerant to the statins.  #7 prophylaxis  PPI for GI prophylaxis, SCDs for DVT prophylaxis.   Code Status: Full Family Communication: Discussed with patient and wife directly at bedside Disposition Plan: Await cultures and then potential discharge home   Pleas Koch, MD  [facility] Pager [phone] 08/11/2012, 6:26 PM    LOS: 1 day

## 2012-08-11 NOTE — Progress Notes (Signed)
Agree with PA note. Imelda Pillow MD

## 2012-08-11 NOTE — Progress Notes (Signed)
Subjective: Patient reports feeling well.  Denies hematuria and is voiding easily.  Denies fevers and chills.  Objective: Vital signs in last 24 hours: Temp:  [98 F (36.7 C)-100 F (37.8 C)] 98 F (36.7 C) (09/18 1423) Pulse Rate:  [68-100] 68  (09/18 1423) Resp:  [16-18] 18  (09/18 1423) BP: (104-132)/(50-74) 104/51 mmHg (09/18 1423) SpO2:  [98 %] 98 % (09/18 1423) Weight:  [76.885 kg (169 lb 8 oz)] 76.885 kg (169 lb 8 oz) (09/18 0547)  Intake/Output from previous day: 09/17 0701 - 09/18 0700 In: 2058.8 [P.O.:720; I.V.:1288.8; IV Piggyback:50] Out: 776 [Urine:775; Stool:1] Intake/Output this shift: Total I/O In: 480 [P.O.:480] Out: 1000 [Urine:1000]  Physical Exam:  General:alert, cooperative and no distress GI: soft, non tender, normal bowel sounds   Lab Results:  Basename 08/11/12 0443 08/10/12 0605  HGB 10.5* 11.7*  HCT 33.8* 37.9*   BMET  Basename 08/11/12 0443 08/10/12 0605  NA 135 133*  K 3.6 3.8  CL 99 95*  CO2 25 25  GLUCOSE 127* 130*  BUN 15 18  CREATININE 1.25 1.22  CALCIUM 8.7 9.4   No results found for this basename: LABPT:3,INR:3 in the last 72 hours No results found for this basename: LABURIN:1 in the last 72 hours Results for orders placed during the hospital encounter of 08/10/12  URINE CULTURE     Status: Normal   Collection Time   08/10/12  6:14 AM      Component Value Range Status Comment   Specimen Description URINE, RANDOM   Final    Special Requests NONE   Final    Culture  Setup Time 08/10/2012 12:50   Final    Colony Count NO GROWTH   Final    Culture NO GROWTH   Final    Report Status 08/11/2012 FINAL   Final   CULTURE, BLOOD (ROUTINE X 2)     Status: Normal (Preliminary result)   Collection Time   08/10/12  8:45 AM      Component Value Range Status Comment   Specimen Description BLOOD RIGHT ARM   Final    Special Requests BOTTLES DRAWN AEROBIC AND ANAEROBIC 5 CC EACH   Final    Culture  Setup Time 08/10/2012 12:47   Final     Culture     Final    Value:        BLOOD CULTURE RECEIVED NO GROWTH TO DATE CULTURE WILL BE HELD FOR 5 DAYS BEFORE ISSUING A FINAL NEGATIVE REPORT   Report Status PENDING   Incomplete   CULTURE, BLOOD (ROUTINE X 2)     Status: Normal (Preliminary result)   Collection Time   08/10/12  8:45 AM      Component Value Range Status Comment   Specimen Description BLOOD RIGHT ARM   Final    Special Requests BOTTLES DRAWN AEROBIC ONLY 5 CC   Final    Culture  Setup Time 08/10/2012 12:47   Final    Culture     Final    Value:        BLOOD CULTURE RECEIVED NO GROWTH TO DATE CULTURE WILL BE HELD FOR 5 DAYS BEFORE ISSUING A FINAL NEGATIVE REPORT   Report Status PENDING   Incomplete     Studies/Results: No results found.  Assessment/Plan:  F/u urine culture.  Continue Rocephin until results avail.  No need for foley at this time.  Pt can self cath if necessary.   LOS: 1 day   Silas Flood.  08/11/2012, 2:50 PM

## 2012-08-12 LAB — URINE CULTURE

## 2012-08-12 MED ORDER — DEXTROSE 5 % IV SOLN: 1.0000 g | INTRAVENOUS | Status: DC

## 2012-08-12 MED ORDER — SODIUM CHLORIDE 0.9 % IJ SOLN: 10.0000 mL | INTRAMUSCULAR | Status: DC | PRN

## 2012-08-12 NOTE — Progress Notes (Signed)
Urology Progress Note : Recurrent UTI, with urosepsis. Cultures neg to date, but much improved on IV Rocephin.  Subjective:     No acute urologic events overnight. Ambulation:   positive Flatus:    positive Bowel movement  positive  Pain: complete resolution  Objective:  Blood pressure 138/66, pulse 55, temperature 97.7 F (36.5 C), temperature source Oral, resp. rate 16, height 5\' 8"  (1.727 m), weight 76.93 kg (169 lb 9.6 oz), SpO2 99.00%.  Physical Exam:  General:  No acute distress, awake Extremities: extremities normal, atraumatic, no cyanosis or edema Genitourinary:  Normal external genitalia.  Foley:none    I/O last 3 completed shifts: In: 2702.8 [P.O.:1764; I.V.:838.8; IV Piggyback:100] Out: 2505 [Urine:2505]  Recent Labs  [facility] 08/11/12 0443 08/10/12 0605   HGB 10.5* 11.7*   WBC 6.6 13.3*   PLT 225 267    Recent Labs  Basename 08/11/12 0443 08/10/12 0605   NA 135 133*   K 3.6 3.8   CL 99 95*   CO2 25 25   BUN 15 18   CREATININE 1.25 1.22   CALCIUM 8.7 9.4   GFRNONAA 56* 58*   GFRAA 65* 67*     No results found for this basename: PT:2,INR:2,APTT:2 in the last 72 hours   No components found with this basename: ABG:2  Assessment/Plan:  Pt is to increase activities as tolerated.   Pt doing well, with negative cultures to date. Voiding well without hematuria.  OK from Urology standpoint for D/c with home health IV Rocephin for full course, or pending change per positive culture.

## 2012-08-12 NOTE — Discharge Summary (Signed)
Physician Discharge Summary  Caleb Berry UJW:[id number] DOB: August 07, 1941 DOA: 08/10/2012  PCP: Caleb Bradford, MD  Admit date: 08/10/2012 Discharge date: 08/12/2012  Recommendations for Outpatient Follow-up:  1. Will need 12 days of IV Rocephin arranged by home health 2. PICC line will be placed this admission-will need removed after followup with primary care physician 3. Will need a CBC in about 5 days at primary followup with primary care physician 4. Please followup urine culture from 08/09/2012 and 08/10/2012   5. 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 He then developed urethral stricture,and has had multiple dilations, and incisions since. He has abnormal urodynamics, with poor voiding pattern, and has been successful in learning SIC technique so he can empty bladder. He has had recurrent UTI, however, and has a hx of multi-drug resistant uti in the past. He has been doing well with prn urethral office dilations, until returning from a recent trip to [place], [place], when he stopped cathing-because he was doing so well, but developed recurrent stricture, wth passage of tissue and clot. He then developed fever and chills, and was admitted for IV Rocephin  On 9/16 /13 Please see below note    Hospital Course:  #1 sepsis of urinary origin-culture negative Likely secondary to UTI. Patient does  have a complicated urological history does get urethral dilatations done about every 3 months. Patient did have an elevated lactic acid level. And was tachycardic on admission. With a white count greater than 12,000. Patient's tachycardia proved to admission. Patient's urine cultures both have been negative and he has clinically responded well to IV Rocephin-patient will need IV Rocephin at least for 12 days as I would consider him as a complicated illness Vitus and we will arrange PICC line placement as well as IV Rocephin till 08/24/2012  #2 urinary tract infection  Urine cultures are pending still. Will place empirically on IV Rocephin. Discussed the patient's case with urology who concurs that patient needs long-term antibiotics as per above-no need at present for infectious disease followup   #3 hypertension  Will resume home regimen of lisinopril, metoprolol. His HCTZ was transiently held and he now currently will be replaced on it on discharge  #4 hematuria  Likely secondary to trauma as the patient stated that he tried a 22 [place] and he thought he may have coiled on him. We'll monitor for now.  #5 gastroesophageal reflux disease  PPI.   #6 coronary artery disease  Stable. Will continue patient's aspirin, lisinopril, metoprolol.   #6 hyperlipidemia  Patient is very intolerant to the statins-recommend a fibrin as an outpatient for some mortality benefit given he is 71 years old   Procedures:  None  Consultations:  Urology  Discharge Exam: Filed Vitals:   08/11/12 1038 08/11/12 1423 08/11/12 2311 08/12/12 0613  BP: 115/74 104/51 135/82 138/66  Pulse:  68 80 55  Temp:  98 F (36.7 C) 97.6 F (36.4  C) 97.7 F (36.5 C)  TempSrc:  Oral Axillary Oral  Resp:  18 14 16   Height:      Weight:    76.93 kg (169 lb 9.6 oz)  SpO2:  98% 99% 99%    General: alert oriented, pleasant in no distress Cardiovascular: s1 s2 no m/r/g Respiratory: CTA B  Discharge Instructions       Medication List     As of 08/12/2012  6:42 PM    STOP taking these medications         omeprazole 20 MG capsule   Commonly known as: PRILOSEC      TAKE these medications         allopurinol 100 MG tablet   Commonly known as: ZYLOPRIM   Take 100 mg by mouth daily at 12 noon.      aspirin EC 81 MG tablet   Take 81 mg by mouth every morning.      cetirizine 10 MG tablet   Commonly known as: ZYRTEC   Take 10 mg by mouth daily as needed. For allergies      dextrose 5 % SOLN 50 mL with cefTRIAXone 1 G SOLR 1 g   Inject 1 g into the vein daily.      diazepam 5 MG tablet   Commonly known as: VALIUM   Take 5 mg by mouth every 6 (six) hours as needed. For anxiety and procedures      gabapentin 100 MG capsule   Commonly known as: NEURONTIN   Take 100-200 mg by mouth 4 (four) times daily. 1 tab in the morning, 1 tab at lunch, 1 tab in the evening, and 2 tabs at bedtime      HYDROcodone-acetaminophen 5-500 MG per tablet   Commonly known as: VICODIN   Take 1 tablet by mouth every 6 (six) hours as needed. For pain      lisinopril 2.5 MG tablet   Commonly known as: PRINIVIL,ZESTRIL   Take 2.5 mg by mouth daily at 12 noon.      magnesium oxide 400 MG tablet   Commonly known as: MAG-OX   Take 400 mg by mouth daily at 12 noon.      metoprolol 50 MG tablet   Commonly known as: LOPRESSOR   Take 1 tablet (50 mg total) by mouth 2 (two) times daily.      nitroGLYCERIN 0.4 MG SL tablet   Commonly known as: NITROSTAT   Place 0.4 mg under the tongue every 5 (five) minutes x 3 doses as needed. For chest pain      OVER THE COUNTER MEDICATION   Take 2 tablets by mouth daily. Green miracle supplement      potassium chloride SA 20 MEQ tablet   Commonly known as: K-DUR,KLOR-CON   Take 0.5 tablets (10 mEq total) by mouth 2 (two) times daily.      ranitidine 150 MG capsule   Commonly known as: ZANTAC   Take 150 mg by mouth at bedtime.      ASK your doctor about these medications          hydrochlorothiazide 25 MG tablet   Commonly known as: HYDRODIURIL   Take 12.5 mg by mouth 2 (two) times daily. Take one-half by mouth twice a day            The results of significant diagnostics from this hospitalization (including imaging, microbiology, ancillary and laboratory) are listed below for reference.    Significant Diagnostic Studies: No  results found.  Microbiology: Recent Results (from the past 240 hour(s))  URINE CULTURE     Status: Normal   Collection Time   08/10/12  6:14 AM      Component Value Range Status Comment   Specimen Description URINE, RANDOM   Final    Special Requests NONE   Final    Culture  Setup Time 08/10/2012 12:50   Final    Colony Count NO GROWTH   Final    Culture NO GROWTH   Final    Report Status 08/11/2012 FINAL   Final   CULTURE, BLOOD (ROUTINE X 2)     Status: Normal (Preliminary result)   Collection Time   08/10/12  8:45 AM      Component Value Range Status Comment   Specimen Description BLOOD RIGHT ARM   Final    Special Requests BOTTLES DRAWN AEROBIC AND ANAEROBIC 5 CC EACH   Final    Culture  Setup Time 08/10/2012 12:47   Final    Culture     Final    Value:        BLOOD CULTURE RECEIVED NO GROWTH TO DATE CULTURE WILL BE HELD FOR 5 DAYS BEFORE ISSUING A FINAL NEGATIVE REPORT   Report Status PENDING   Incomplete   CULTURE, BLOOD (ROUTINE X 2)     Status: Normal (Preliminary result)   Collection Time   08/10/12  8:45 AM      Component Value Range Status Comment   Specimen Description BLOOD RIGHT ARM   Final    Special Requests BOTTLES DRAWN AEROBIC ONLY 5 CC   Final    Culture  Setup Time 08/10/2012 12:47   Final    Culture     Final    Value:        BLOOD CULTURE RECEIVED NO GROWTH TO DATE CULTURE WILL BE HELD FOR 5 DAYS BEFORE ISSUING A FINAL NEGATIVE REPORT   Report Status PENDING   Incomplete   URINE CULTURE     Status: Normal   Collection Time   08/10/12  1:07 PM      Component Value Range Status Comment   Specimen  Description URINE, CLEAN CATCH   Final    Special Requests NONE   Final    Culture  Setup Time 08/11/2012 01:17   Final    Colony Count NO GROWTH   Final    Culture NO GROWTH   Final    Report Status 08/12/2012 FINAL   Final      Labs: Basic Metabolic Panel:  Lab 08/11/12 1610 08/10/12 0605  NA 135 133*  K 3.6 3.8  CL 99 95*  CO2 25 25  GLUCOSE 127* 130*  BUN 15 18  CREATININE 1.25 1.22  CALCIUM 8.7 9.4  MG -- 1.4*  PHOS -- --   Liver Function Tests:  Lab 08/11/12 0443  AST 17  ALT 20  ALKPHOS 48  BILITOT 0.2*  PROT 5.9*  ALBUMIN 3.0*   No results found for this basename: LIPASE:5,AMYLASE:5 in the last 168 hours No results found for this basename: AMMONIA:5 in the last 168 hours CBC:  Lab 08/11/12 0443 08/10/12 0605  WBC 6.6 13.3*  NEUTROABS 5.4 11.7*  HGB 10.5* 11.7*  HCT 33.8* 37.9*  MCV 73.2* 72.6*  PLT 225 267   Cardiac Enzymes: No results found for this basename: CKTOTAL:5,CKMB:5,CKMBINDEX:5,TROPONINI:5 in the last 168 hours BNP: BNP (last 3 results) No results found for this basename: PROBNP:3 in the last  8760 hours CBG: No results found for this basename: GLUCAP:5 in the last 168 hours  Time coordinating discharge: *  Signed:  Rhetta Mura  [facility] 08/12/2012, 12:14 PM

## 2012-08-12 NOTE — Progress Notes (Signed)
Peripherally Inserted Central Catheter/Midline Placement  The IV Nurse has discussed with the patient and/or persons authorized to consent for the patient, the purpose of this procedure and the potential benefits and risks involved with this procedure.  The benefits include less needle sticks, lab draws from the catheter and patient may be discharged home with the catheter.  Risks include, but not limited to, infection, bleeding, blood clot (thrombus formation), and puncture of an artery; nerve damage and irregular heat beat.  Alternatives to this procedure were also discussed.  PICC/Midline Placement Documentation        Caleb Berry 08/12/2012, 1:25 PM

## 2012-08-12 NOTE — Progress Notes (Signed)
Received call for Attending MD - patient needs IV antibiotics for home/ IV rocephin x 12 days; Talked to the patient about DCP/HHC, options given, patient chose [facility]; Norberta Keens RN with [facility] called for arrangements; B Ave Filter RN,BSN,MHA

## 2012-08-16 LAB — CULTURE, BLOOD (ROUTINE X 2)
Culture: NO GROWTH
Culture: NO GROWTH

## 2012-08-20 ENCOUNTER — Encounter (HOSPITAL_COMMUNITY): Payer: Self-pay | Admitting: Emergency Medicine

## 2012-08-20 ENCOUNTER — Inpatient Hospital Stay (HOSPITAL_COMMUNITY)
Admission: EM | Admit: 2012-08-20 | Discharge: 2012-08-22 | DRG: 300 | Disposition: A | Discharge status: Home Health | Payer: Medicare Other | Attending: Family Medicine | Admitting: Family Medicine

## 2012-08-20 DIAGNOSIS — I2510 Atherosclerotic heart disease of native coronary artery without angina pectoris: Secondary | ICD-10-CM

## 2012-08-20 DIAGNOSIS — E785 Hyperlipidemia, unspecified: Secondary | ICD-10-CM | POA: Diagnosis present

## 2012-08-20 DIAGNOSIS — Z87891 Personal history of nicotine dependence: Secondary | ICD-10-CM

## 2012-08-20 DIAGNOSIS — I82629 Acute embolism and thrombosis of deep veins of unspecified upper extremity: Secondary | ICD-10-CM

## 2012-08-20 DIAGNOSIS — N390 Urinary tract infection, site not specified: Secondary | ICD-10-CM

## 2012-08-20 DIAGNOSIS — N200 Calculus of kidney: Secondary | ICD-10-CM

## 2012-08-20 DIAGNOSIS — I25810 Atherosclerosis of coronary artery bypass graft(s) without angina pectoris: Secondary | ICD-10-CM

## 2012-08-20 DIAGNOSIS — E7800 Pure hypercholesterolemia, unspecified: Secondary | ICD-10-CM | POA: Diagnosis present

## 2012-08-20 DIAGNOSIS — R319 Hematuria, unspecified: Secondary | ICD-10-CM

## 2012-08-20 DIAGNOSIS — K219 Gastro-esophageal reflux disease without esophagitis: Secondary | ICD-10-CM

## 2012-08-20 DIAGNOSIS — I10 Essential (primary) hypertension: Secondary | ICD-10-CM

## 2012-08-20 DIAGNOSIS — I999 Unspecified disorder of circulatory system: Principal | ICD-10-CM | POA: Diagnosis present

## 2012-08-20 DIAGNOSIS — R339 Retention of urine, unspecified: Secondary | ICD-10-CM

## 2012-08-20 DIAGNOSIS — A419 Sepsis, unspecified organism: Secondary | ICD-10-CM

## 2012-08-20 DIAGNOSIS — H9190 Unspecified hearing loss, unspecified ear: Secondary | ICD-10-CM

## 2012-08-20 DIAGNOSIS — N35919 Unspecified urethral stricture, male, unspecified site: Secondary | ICD-10-CM

## 2012-08-20 DIAGNOSIS — Y849 Medical procedure, unspecified as the cause of abnormal reaction of the patient, or of later complication, without mention of misadventure at the time of the procedure: Secondary | ICD-10-CM | POA: Diagnosis present

## 2012-08-20 DIAGNOSIS — Z951 Presence of aortocoronary bypass graft: Secondary | ICD-10-CM

## 2012-08-20 DIAGNOSIS — M7989 Other specified soft tissue disorders: Secondary | ICD-10-CM

## 2012-08-20 DIAGNOSIS — Z9861 Coronary angioplasty status: Secondary | ICD-10-CM

## 2012-08-20 DIAGNOSIS — Y92009 Unspecified place in unspecified non-institutional (private) residence as the place of occurrence of the external cause: Secondary | ICD-10-CM

## 2012-08-20 DIAGNOSIS — M79609 Pain in unspecified limb: Secondary | ICD-10-CM

## 2012-08-20 DIAGNOSIS — Z8546 Personal history of malignant neoplasm of prostate: Secondary | ICD-10-CM

## 2012-08-20 LAB — CBC WITH DIFFERENTIAL/PLATELET
Eosinophils Relative: 3 % (ref 0–5)
Lymphocytes Relative: 22 % (ref 12–46)
Lymphs Abs: 1.6 10*3/uL (ref 0.7–4.0)
MCH: 22.8 pg — ABNORMAL LOW (ref 26.0–34.0)
MCHC: 31.1 g/dL (ref 30.0–36.0)
Monocytes Absolute: 0.7 10*3/uL (ref 0.1–1.0)
Monocytes Relative: 9 % (ref 3–12)
Neutro Abs: 4.7 10*3/uL (ref 1.7–7.7)
Platelets: 355 10*3/uL (ref 150–400)

## 2012-08-20 LAB — POCT I-STAT, CHEM 8
BUN: 19 mg/dL (ref 6–23)
Chloride: 100 mEq/L (ref 96–112)
Creatinine, Ser: 1.3 mg/dL (ref 0.50–1.35)
Glucose, Bld: 113 mg/dL — ABNORMAL HIGH (ref 70–99)
Potassium: 4 mEq/L (ref 3.5–5.1)
Sodium: 136 mEq/L (ref 135–145)

## 2012-08-20 MED ORDER — METOPROLOL TARTRATE 50 MG PO TABS
50.0000 mg | ORAL_TABLET | Freq: Two times a day (BID) | ORAL | Status: DC
  Administered 2012-08-20 – 2012-08-22 (×4): 50 mg via ORAL
  Filled 2012-08-20 (×5): qty 1

## 2012-08-20 MED ORDER — POTASSIUM CHLORIDE CRYS ER 10 MEQ PO TBCR
10.0000 meq | EXTENDED_RELEASE_TABLET | Freq: Two times a day (BID) | ORAL | Status: DC
  Administered 2012-08-20 – 2012-08-22 (×4): 10 meq via ORAL
  Filled 2012-08-20 (×5): qty 1

## 2012-08-20 MED ORDER — GABAPENTIN 100 MG PO CAPS: 100.0000 mg | ORAL_CAPSULE | Freq: Four times a day (QID) | ORAL | Status: DC

## 2012-08-20 MED ORDER — WARFARIN - PHARMACIST DOSING INPATIENT: Freq: Every day | Status: DC

## 2012-08-20 MED ORDER — DEXTROSE 5 % IV SOLN
1.0000 g | Freq: Once | INTRAVENOUS | Status: AC
  Administered 2012-08-20: 1 g via INTRAVENOUS
  Filled 2012-08-20: qty 10

## 2012-08-20 MED ORDER — ALLOPURINOL 100 MG PO TABS
100.0000 mg | ORAL_TABLET | Freq: Every day | ORAL | Status: DC
  Administered 2012-08-21 – 2012-08-22 (×2): 100 mg via ORAL
  Filled 2012-08-20 (×2): qty 1

## 2012-08-20 MED ORDER — HEPARIN (PORCINE) IN NACL 100-0.45 UNIT/ML-% IJ SOLN
1450.0000 [IU]/h | INTRAMUSCULAR | Status: DC
  Administered 2012-08-21 (×2): 1450 [IU]/h via INTRAVENOUS
  Filled 2012-08-20 (×4): qty 250

## 2012-08-20 MED ORDER — GABAPENTIN 100 MG PO CAPS
200.0000 mg | ORAL_CAPSULE | Freq: Every day | ORAL | Status: DC
  Administered 2012-08-20 – 2012-08-21 (×2): 200 mg via ORAL
  Filled 2012-08-20 (×2): qty 2
  Filled 2012-08-20: qty 1
  Filled 2012-08-20: qty 2

## 2012-08-20 MED ORDER — ONDANSETRON HCL 4 MG PO TABS: 4.0000 mg | ORAL_TABLET | Freq: Four times a day (QID) | ORAL | Status: DC | PRN

## 2012-08-20 MED ORDER — ONDANSETRON HCL 4 MG/2ML IJ SOLN: 4.0000 mg | Freq: Four times a day (QID) | INTRAMUSCULAR | Status: DC | PRN

## 2012-08-20 MED ORDER — WARFARIN SODIUM 7.5 MG PO TABS
7.5000 mg | ORAL_TABLET | Freq: Once | ORAL | Status: AC
  Administered 2012-08-20: 7.5 mg via ORAL
  Filled 2012-08-20: qty 1

## 2012-08-20 MED ORDER — LISINOPRIL 2.5 MG PO TABS
2.5000 mg | ORAL_TABLET | Freq: Every day | ORAL | Status: DC
  Administered 2012-08-21 – 2012-08-22 (×2): 2.5 mg via ORAL
  Filled 2012-08-20 (×2): qty 1

## 2012-08-20 MED ORDER — GABAPENTIN 100 MG PO CAPS
100.0000 mg | ORAL_CAPSULE | ORAL | Status: DC
  Administered 2012-08-20 – 2012-08-22 (×6): 100 mg via ORAL
  Filled 2012-08-20 (×7): qty 1

## 2012-08-20 MED ORDER — DIAZEPAM 5 MG PO TABS
5.0000 mg | ORAL_TABLET | Freq: Four times a day (QID) | ORAL | Status: DC | PRN
  Administered 2012-08-22: 5 mg via ORAL
  Filled 2012-08-20: qty 1

## 2012-08-20 MED ORDER — HYDROCODONE-ACETAMINOPHEN 10-325 MG PO TABS
1.0000 | ORAL_TABLET | ORAL | Status: DC | PRN
  Administered 2012-08-20 – 2012-08-21 (×4): 1 via ORAL
  Filled 2012-08-20 (×4): qty 1

## 2012-08-20 MED ORDER — CEFTRIAXONE SODIUM 1 G IJ SOLR: 1.0000 g | Freq: Once | INTRAMUSCULAR | Status: DC

## 2012-08-20 MED ORDER — MAGNESIUM OXIDE 400 MG PO TABS
400.0000 mg | ORAL_TABLET | Freq: Every day | ORAL | Status: DC
  Administered 2012-08-21 – 2012-08-22 (×2): 400 mg via ORAL
  Filled 2012-08-20 (×2): qty 1

## 2012-08-20 MED ORDER — PANTOPRAZOLE SODIUM 40 MG PO TBEC
40.0000 mg | DELAYED_RELEASE_TABLET | Freq: Every day | ORAL | Status: DC
Start: 2012-08-20 — End: 2012-08-22
  Administered 2012-08-20 – 2012-08-22 (×3): 40 mg via ORAL
  Filled 2012-08-20 (×3): qty 1

## 2012-08-20 MED ORDER — ASPIRIN EC 81 MG PO TBEC
81.0000 mg | DELAYED_RELEASE_TABLET | Freq: Every morning | ORAL | Status: DC
  Administered 2012-08-21 – 2012-08-22 (×2): 81 mg via ORAL
  Filled 2012-08-20 (×2): qty 1

## 2012-08-20 MED ORDER — ADULT MULTIVITAMIN W/MINERALS CH
1.0000 | ORAL_TABLET | Freq: Every day | ORAL | Status: DC
  Administered 2012-08-20 – 2012-08-22 (×3): 1 via ORAL
  Filled 2012-08-20 (×3): qty 1

## 2012-08-20 MED ORDER — HEPARIN BOLUS VIA INFUSION
4000.0000 [IU] | Freq: Once | INTRAVENOUS | Status: AC
  Administered 2012-08-20: 4000 [IU] via INTRAVENOUS

## 2012-08-20 MED ORDER — HEPARIN BOLUS VIA INFUSION
1500.0000 [IU] | Freq: Once | INTRAVENOUS | Status: AC
  Administered 2012-08-20: 1500 [IU] via INTRAVENOUS
  Filled 2012-08-20: qty 1500

## 2012-08-20 MED ORDER — HEPARIN (PORCINE) IN NACL 100-0.45 UNIT/ML-% IJ SOLN
1300.0000 [IU]/h | INTRAMUSCULAR | Status: DC
Start: 2012-08-20 — End: 2012-08-20
  Filled 2012-08-20 (×2): qty 250

## 2012-08-20 MED ORDER — NITROGLYCERIN 0.4 MG SL SUBL: 0.4000 mg | SUBLINGUAL_TABLET | SUBLINGUAL | Status: DC | PRN

## 2012-08-20 MED ORDER — HYDROCHLOROTHIAZIDE 12.5 MG PO CAPS
12.5000 mg | ORAL_CAPSULE | Freq: Two times a day (BID) | ORAL | Status: DC
  Administered 2012-08-20 – 2012-08-22 (×4): 12.5 mg via ORAL
  Filled 2012-08-20 (×6): qty 1

## 2012-08-20 MED ORDER — LORATADINE 10 MG PO TABS
10.0000 mg | ORAL_TABLET | Freq: Every day | ORAL | Status: DC
  Administered 2012-08-20 – 2012-08-21 (×2): 10 mg via ORAL
  Filled 2012-08-20 (×3): qty 1

## 2012-08-20 MED ORDER — OMEPRAZOLE MAGNESIUM 20 MG PO TBEC: 20.0000 mg | DELAYED_RELEASE_TABLET | Freq: Every day | ORAL | Status: DC

## 2012-08-20 NOTE — Progress Notes (Signed)
Patient completed urinary catheterization with difficulty per patient

## 2012-08-20 NOTE — Progress Notes (Signed)
[facility] ED CM spoke with Asher Muir, [facility] coordinator, to make her aware of requested admission for picc line issues pending bed request Asher Muir states pt to be followed for possible d/c needs

## 2012-08-20 NOTE — Progress Notes (Signed)
[facility] ED CM had spoken with pt and male family member who had chose to have [facility] follow pt while hospitalized for possible needs   HOME HEALTH AGENCIES SERVING [place]   Agencies that are Medicare-Certified and are affiliated with [facility]  Telephone Number Address  [facility]   The [facility] has ownership interest in this company; however, you are under no obligation to use this agency. [phone] or  [phone] [address] [place], [place] [address] [email or web address]   Agencies that are Medicare-Certified and are not affiliated with [facility] Telephone Number Address  [facility] [phone] Fax [phone] [address], [address] [place], [place]  [address] [email or web address]  [facility] [phone] or [phone] Fax [phone] [address] [place], [place] [address] [email or web address]  [facility] [phone] Fax [phone] [address] [place], [place] [address] [email or web address]  [facility] [phone] Fax [phone] [address] [place], [place]  [address] [email or web address]  [facility] [phone] Fax [phone] [address] [place], [place] [address] [email or web address]  [facility] [phone] [address] [place], [place] [address] [email or web address].dk  [facility] [phone]  [address] [place], [place] [address] [email or web address]  [facility] [phone] or [phone] Fax [phone] [place], [place]   [address] [email or web address]  [facility] [phone] Fax [phone] [address] [place], [place]  [address]  [facility]  [phone] Fax [phone] [address] [place], [place] [address] [email or web address]

## 2012-08-20 NOTE — H&P (Signed)
History and Physical Examination  Date: 08/20/2012  Patient name: Caleb Berry Medical record number: [id number] Date of birth: [birth year] Age: 71 y.o. Gender: male PCP: [name], MD Urologist: Dr. Patsi Sears / [place]  Chief Complaint:  Chief Complaint  Patient presents with  . PICC line infection      History of Present Illness: Caleb Berry is an 71 y.o. male who was sent to ED by his urologist.  He had been receiving IV antibiotics (ceftriaxone) at home for a complicated urinary tract infection. He was scheduled for 5 more days of IV Rocephin. He presents the emergency department today with 24 hours of right upper extremity pain and new redness forming around the insertion site of the right upper extremities PICC line. He has no chest pain shortness of breath. His had no fevers or chills. He reports uncomfortable feeling in his right upper extremity. He is tolerating the antibiotics without difficulty. His pain is mild to moderate in severity.  He was seen in ED and [place] was ordered and positive for DVT of right IJ.  The patient was started on heparin IV and hospital admission was requested.  Pt also suffers from urethral stricture and reports that he has to in/out cath daily and reports that he feels like he needs to have a dilatation done and reports that he would like for his urologist to see him in the hospital.      Past Medical History Past Medical History  Diagnosis Date  . HTN (hypertension)   . History of prostate cancer     S/P RADICAL PROSTATECTOMY  . Nephrolithiasis   . Stricture of urethra SELF-CATH AS NEEDED    S/P MULTIPLE DILATION'S   . Bladder neck contracture     POST TUI AND UROLUM STENTS  . Status post carotid endarterectomy LEFT  . Myocardial infarction 2006    S/P PTCA W/ X2 DE STENTS  . Status post primary angioplasty with coronary stent 2006-- MI    X2 DRUG-ELUTING STENTS  . CAD (coronary artery disease) CARDIOLOGIST- DR Riley Kill- VISIT NOTE  09-08-11 IN EPIC    LAST STRESS TEST 2006 ABNORMAL  . DDD (degenerative disc disease)   . History of gout PT STATES STABLE  . GERD (gastroesophageal reflux disease)     CONTROLLED W/ PRILOSEC AND ZANTAC  . Impaired hearing BILATERAL AIDS  . Insomnia   . S/P coronary artery bypass graft x 3   . Peripheral neuropathy FEET  . Urinary retention   . Foley catheter in place     in and out cath prn    Past Surgical History Past Surgical History  Procedure Date  . Cysto w/ balloon dilation urethral stricture 05-19-2011  . Transurethral vaporization bladder neck contracture / dilatation urethral stricture 12-26-2010  . Coronary artery bypass graft 2000    X3  . Prostatectomy 1996  . Cystoscopy w/ internal urethrotomy 2003      X2--   W/ UROLUME PROSTHESIS  . Percutaneous nephrotomy 1989  . Extracorporeal shock wave lithotripsy X5  . Urethral dilatation's MULTIPLE   . Coronary angioplasty 2006    X2 DRUG-ELUTING STENTS  . Cardiac catheterization 12-18-2010 AND 2008  . Carotid endarterectomy 2002    LEFT  . Cystoscopy with urethral dilatation 11/27/2011    Procedure: CYSTOSCOPY WITH URETHRAL DILATATION;  Surgeon: Kathi Ludwig, MD;  Location: [facility];  Service: Urology;  Laterality: N/A;  ROOM # 2 REQUESTED (COOK)   . Cystoscopy with urethral dilatation  02/26/2012    Procedure: CYSTOSCOPY WITH URETHRAL DILATATION;  Surgeon: Kathi Ludwig, MD;  Location: [facility];  Service: Urology;  Laterality: N/A;  cysto, urethral dilation and possible holmium laser of stricture c arm, laser  . Transurethral resection of prostate 02/26/2012    Procedure: TRANSURETHRAL RESECTION OF THE PROSTATE (TURP);  Surgeon: Kathi Ludwig, MD;  Location: [facility];  Service: Urology;;    Home Meds: Prior to Admission medications   Medication Sig Start Date End Date Taking? Authorizing Provider  allopurinol (ZYLOPRIM) 100 MG tablet Take 100  mg by mouth daily at 12 noon.  12/31/10  Yes Historical Provider, MD  aspirin EC 81 MG tablet Take 81 mg by mouth every morning.   Yes Historical Provider, MD  cetirizine (ZYRTEC) 10 MG tablet Take 10 mg by mouth daily as needed. For allergies   Yes Historical Provider, MD  dextrose 5 % SOLN 50 mL with cefTRIAXone 1 G SOLR 1 g Inject 1 g into the vein daily. 08/12/12  Yes Rhetta Mura, MD  diazepam (VALIUM) 5 MG tablet Take 5 mg by mouth every 6 (six) hours as needed. For anxiety and procedures   Yes Historical Provider, MD  gabapentin (NEURONTIN) 100 MG capsule Take 100-200 mg by mouth 4 (four) times daily. 1 tab in the morning, 1 tab at lunch, 1 tab in the evening, and 2 tabs at bedtime 12/31/10  Yes Historical Provider, MD  hydrochlorothiazide (HYDRODIURIL) 25 MG tablet Take 12.5 mg by mouth 2 (two) times daily. Take one-half by mouth twice a day 09/08/11  Yes Herby Abraham, MD  HYDROcodone-acetaminophen (VICODIN) 5-500 MG per tablet Take 1 tablet by mouth every 6 (six) hours as needed. For pain   Yes Historical Provider, MD  lisinopril (PRINIVIL,ZESTRIL) 2.5 MG tablet Take 2.5 mg by mouth daily at 12 noon. 03/10/12  Yes Herby Abraham, MD  magnesium oxide (MAG-OX) 400 MG tablet Take 400 mg by mouth daily at 12 noon.  03/07/11  Yes Herby Abraham, MD  metoprolol (LOPRESSOR) 50 MG tablet Take 1 tablet (50 mg total) by mouth 2 (two) times daily. 03/17/12  Yes Herby Abraham, MD  Multiple Vitamin (MULTIVITAMIN WITH MINERALS) TABS Take 1 tablet by mouth daily.   Yes Historical Provider, MD  omeprazole (PRILOSEC OTC) 20 MG tablet Take 20 mg by mouth daily.   Yes Historical Provider, MD  OVER THE COUNTER MEDICATION Take 2 tablets by mouth daily. Green miracle supplement   Yes Historical Provider, MD  potassium chloride SA (K-DUR,KLOR-CON) 20 MEQ tablet Take 0.5 tablets (10 mEq total) by mouth 2 (two) times daily. 03/10/12  Yes Herby Abraham, MD  ranitidine (ZANTAC) 150 MG capsule Take 150 mg by  mouth at bedtime.    Yes Historical Provider, MD  nitroGLYCERIN (NITROSTAT) 0.4 MG SL tablet Place 0.4 mg under the tongue every 5 (five) minutes x 3 doses as needed. For chest pain 09/08/11 09/07/12  Herby Abraham, MD    Allergies: Naproxen sodium and Statins  Social History:  History   Social History  . Marital Status: Married    Spouse Name: N/A    Number of Children: N/A  . Years of Education: N/A   Occupational History  . pastor    Social History Main Topics  . Smoking status: Former Smoker    Types: Cigarettes    Quit date: 11/24/1968  . Smokeless tobacco: Never Used  . Alcohol Use: No  . Drug  Use: No  . Sexually Active: No   Other Topics Concern  . Not on file   Social History Narrative  . No narrative on file   Family History:  Family History  Problem Relation Age of Onset  . Hypertension      Review of Systems: Pertinent items are noted in HPI. All other systems reviewed and reported as negative.   Physical Exam: Blood pressure 140/89, pulse 69, temperature 98.5 F (36.9 C), temperature source Oral, resp. rate 18, height 5\' 9"  (1.753 m), weight 78.019 kg (172 lb), SpO2 99.00%. General appearance: alert, cooperative, appears stated age and no distress Head: Normocephalic, without obvious abnormality, atraumatic Eyes: negative Nose: Nares normal. Septum midline. Mucosa normal. No drainage or sinus tenderness., no discharge Throat: lips, mucosa, and tongue normal; teeth and gums normal Neck: no adenopathy, no carotid bruit, no JVD, supple, symmetrical, trachea midline and thyroid not enlarged, symmetric, no tenderness/mass/nodules Lungs: clear to auscultation bilaterally Chest wall: no tenderness Heart: regular rate and rhythm, S1, S2 normal, no murmur, click, rub or gallop Extremities: extremities normal, atraumatic, no cyanosis or edema Skin: Skin color, texture, turgor normal. No rashes or lesions  Lab  And Imaging results:  Results for orders  placed during the hospital encounter of 08/20/12 (from the past 24 hour(s))  CBC WITH DIFFERENTIAL     Status: Abnormal   Collection Time   08/20/12 10:29 AM      Component Value Range   WBC 7.2  4.0 - 10.5 K/uL   RBC 5.00  4.22 - 5.81 MIL/uL   Hemoglobin 11.4 (*) 13.0 - 17.0 g/dL   HCT 62.1 (*) 30.8 - 65.7 %   MCV 73.4 (*) 78.0 - 100.0 fL   MCH 22.8 (*) 26.0 - 34.0 pg   MCHC 31.1  30.0 - 36.0 g/dL   RDW 84.6 (*) 96.2 - 95.2 %   Platelets 355  150 - 400 K/uL   Neutrophils Relative 65  43 - 77 %   Neutro Abs 4.7  1.7 - 7.7 K/uL   Lymphocytes Relative 22  12 - 46 %   Lymphs Abs 1.6  0.7 - 4.0 K/uL   Monocytes Relative 9  3 - 12 %   Monocytes Absolute 0.7  0.1 - 1.0 K/uL   Eosinophils Relative 3  0 - 5 %   Eosinophils Absolute 0.2  0.0 - 0.7 K/uL   Basophils Relative 1  0 - 1 %   Basophils Absolute 0.1  0.0 - 0.1 K/uL  PROTIME-INR     Status: Normal   Collection Time   08/20/12 10:29 AM      Component Value Range   Prothrombin Time 12.9  11.6 - 15.2 seconds   INR 0.98  0.00 - 1.49  POCT I-STAT, CHEM 8     Status: Abnormal   Collection Time   08/20/12 10:36 AM      Component Value Range   Sodium 136  135 - 145 mEq/L   Potassium 4.0  3.5 - 5.1 mEq/L   Chloride 100  96 - 112 mEq/L   BUN 19  6 - 23 mg/dL   Creatinine, Ser 8.41  0.50 - 1.35 mg/dL   Glucose, Bld 324 (*) 70 - 99 mg/dL   Calcium, Ion 4.01  0.27 - 1.30 mmol/L   TCO2 26  0 - 100 mmol/L   Hemoglobin 13.3  13.0 - 17.0 g/dL   HCT 25.3  66.4 - 40.3 %     Impression   *  DVT of upper extremity (deep vein thrombosis)  HYPERCHOLESTEROLEMIA  IIA  HYPERTENSION  CAD  PROSTATE CANCER, HX OF  GERD (gastroesophageal reflux disease)  Urethral stricture  Urinary retention  Hearing difficulty   Plan  Admit for anticoagulation with IV heparin per pharmacy.  Consult urologist for eval of urethral stricture and need for dilatation.  Pt nearly completed full course of ceftriaxone IV and urine cultures negative x 2 from 9/17  hospitalization.  Please see orders and follow hospital course.  PICC line will be removed.    Standley Dakins MD [facility] [place], [place] [phone] 08/20/2012, 4:14 PM

## 2012-08-20 NOTE — Progress Notes (Signed)
Midline catheter removed per order DVT right IJ - pressure to site x 4 minutes - vaseline gauze dsg to site. VSandritter RN/[facility]ABC

## 2012-08-20 NOTE — ED Notes (Signed)
PICC line in right upper arm causing pain, called home health nurse and doctor's office, told to come here for check. PICC line insertion site reddened, slightly swollen, states pain radiates from upper arm to wrist area. Strong radial pulse present , no numbness/tingling in arm/fingers.

## 2012-08-20 NOTE — Progress Notes (Addendum)
ANTICOAGULATION CONSULT NOTE - Initial Consult  Pharmacy Consult for heparin Indication: DVT  Allergies  Allergen Reactions  . Naproxen Sodium Anaphylaxis and Shortness Of Breath    Reaction: Heart attack symptoms  . Statins Other (See Comments)    SEVERE MUSCLE ACHES/ JOINT PAIN    Patient Measurements:   Heparin Dosing Weight: 77 kg  Vital Signs: Temp: 98.5 F (36.9 C) (09/27 0905) Temp src: Oral (09/27 0905) BP: 140/89 mmHg (09/27 0905) Pulse Rate: 69  (09/27 0905)  Labs: No results found for this basename: HGB:2,HCT:3,PLT:3,APTT:3,LABPROT:3,INR:3,HEPARINUNFRC:3,CREATININE:3,CKTOTAL:3,CKMB:3,TROPONINI:3 in the last 72 hours  The CrCl is unknown because both a height and weight (above a minimum accepted value) are required for this calculation.   Medical History: Past Medical History  Diagnosis Date  . HTN (hypertension)   . History of prostate cancer     S/P RADICAL PROSTATECTOMY  . Nephrolithiasis   . Stricture of urethra SELF-CATH AS NEEDED    S/P MULTIPLE DILATION'S   . Bladder neck contracture     POST TUI AND UROLUM STENTS  . Status post carotid endarterectomy LEFT  . Myocardial infarction 2006    S/P PTCA W/ X2 DE STENTS  . Status post primary angioplasty with coronary stent 2006-- MI    X2 DRUG-ELUTING STENTS  . CAD (coronary artery disease) CARDIOLOGIST- DR Riley Kill- VISIT NOTE 09-08-11 IN EPIC    LAST STRESS TEST 2006 ABNORMAL  . DDD (degenerative disc disease)   . History of gout PT STATES STABLE  . GERD (gastroesophageal reflux disease)     CONTROLLED W/ PRILOSEC AND ZANTAC  . Impaired hearing BILATERAL AIDS  . Insomnia   . S/P coronary artery bypass graft x 3   . Peripheral neuropathy FEET  . Urinary retention   . Foley catheter in place     in and out cath prn    Medications:  Scheduled:    . cefTRIAXone (ROCEPHIN)  IV  1 g Intravenous Once  . DISCONTD: cefTRIAXone (ROCEPHIN) IM  1 g Intramuscular Once    Assessment: 60 YOM with PICC  in place for IV ceftriaxone therapy as outpatient for treatment of complicated UTI.  Dopplers reveal DVT in jugular vein and superficial thrombus in basilic vein around PICC. He has h/o hematuria. Labs: Hgb=11.4  plts = 335 .   Goal of Therapy:  Heparin level 0.3-0.7 units/ml Monitor platelets by anticoagulation protocol: Yes   Plan:  - Check baseline INR - Orders to start heparin, LMWH typically preferred per CHEST recommendations but d/t possible procedures re: PICC will continue orders for heparin - Heparin 4000 unit bolus then heparin gtt at 1300 units/hr - check 8hr heparin level - Daily CBC and heparin level - f/u plan for long-term anticoagulation  Zeigler, Tad Moore 08/20/2012,10:27 AM   Addendum 5:00 PM  Begin Warfarin today for UE DVT Heparin infusion begun today. Today is D1/5 overlap with Heparin/Warfarin Require 5 days overlap with at least 2 days of therapeutic INR (2-3) within overlap period.  Coumadin 7.5mg  today Daily PT/INR   Addendum 9:41 PM  Heparin level 0.22 units/ml below range of 0.3 to 0.7 units/ml. Bolus of 4000 units given, infusion at 1300 units/hr.  Rebolus with 1500 units and increase rate to 1450 units/hr.  Next heparin level with am labs.  Otho Bellows PharmD 08/20/2012 9:43 PM

## 2012-08-20 NOTE — ED Notes (Addendum)
Pump would not alllow bolus function at 4000unit/hr, changed rate to 3999units/hr on primary to complete bolus.  Will need to go back in one hour to restart infusion rate on pump.

## 2012-08-20 NOTE — Progress Notes (Signed)
Disposition note  71 y/o male with pmh significant for HTN, HLD, GERD, prostate cancer and recent admission secondary to urosepsis; came to the hospital complaining of right upper extremity swelling; pain and erythema. Patient with picc line in this arm for IV rocephin therapy for his urosepsis. In ED fresh clot in jugular vein and also SVT of the basilic vein around PICC line. Vital signs are stable and patient no complaining of SOB or CP. Will admit on med surg, for observation to guarantee stability of this fresh clot in the jugular vein after removing picc line; will need anticoagulation as well and probably intramuscular tx as an outpatient with rocephin to finish antibiotic therapy.  Dhalia Zingaro [phone]

## 2012-08-20 NOTE — Progress Notes (Signed)
Right:  DVT noted in the jugular vein and superficial thrombus in the basilic vein around the PICC.

## 2012-08-20 NOTE — ED Provider Notes (Signed)
History     CSN: [id number]  Arrival date & time 08/20/12  4098   First MD Initiated Contact with Patient 08/20/12 [phone]      Chief Complaint  Patient presents with  . PICC line infection     The history is provided by the patient and medical records.   The patient is at home receiving IV antibiotics for colic a urinary tract infection.  He is scheduled for 5 more days of IV Rocephin.  He presents the emergency department today with 24 hours of right upper extremity pain and new redness forming around the insertion site of the right upper extremities PICC line.  He has no chest pain shortness of breath.  His had no fevers or chills.  He reports uncomfortable feeling in his right upper extremity.  He is tolerating the antibiotics without difficulty.  His pain is mild to moderate in severity. Past Medical History  Diagnosis Date  . HTN (hypertension)   . History of prostate cancer     S/P RADICAL PROSTATECTOMY  . Nephrolithiasis   . Stricture of urethra SELF-CATH AS NEEDED    S/P MULTIPLE DILATION'S   . Bladder neck contracture     POST TUI AND UROLUM STENTS  . Status post carotid endarterectomy LEFT  . Myocardial infarction 2006    S/P PTCA W/ X2 DE STENTS  . Status post primary angioplasty with coronary stent 2006-- MI    X2 DRUG-ELUTING STENTS  . CAD (coronary artery disease) CARDIOLOGIST- DR Riley Kill- VISIT NOTE 09-08-11 IN EPIC    LAST STRESS TEST 2006 ABNORMAL  . DDD (degenerative disc disease)   . History of gout PT STATES STABLE  . GERD (gastroesophageal reflux disease)     CONTROLLED W/ PRILOSEC AND ZANTAC  . Impaired hearing BILATERAL AIDS  . Insomnia   . S/P coronary artery bypass graft x 3   . Peripheral neuropathy FEET  . Urinary retention   . Foley catheter in place     in and out cath prn    Past Surgical History  Procedure Date  . Cysto w/ balloon dilation urethral stricture 05-19-2011  . Transurethral vaporization bladder neck contracture / dilatation  urethral stricture 12-26-2010  . Coronary artery bypass graft 2000    X3  . Prostatectomy 1996  . Cystoscopy w/ internal urethrotomy 2003      X2--   W/ UROLUME PROSTHESIS  . Percutaneous nephrotomy 1989  . Extracorporeal shock wave lithotripsy X5  . Urethral dilatation's MULTIPLE   . Coronary angioplasty 2006    X2 DRUG-ELUTING STENTS  . Cardiac catheterization 12-18-2010 AND 2008  . Carotid endarterectomy 2002    LEFT  . Cystoscopy with urethral dilatation 11/27/2011    Procedure: CYSTOSCOPY WITH URETHRAL DILATATION;  Surgeon: Kathi Ludwig, MD;  Location: [facility];  Service: Urology;  Laterality: N/A;  ROOM # 2 REQUESTED (COOK)   . Cystoscopy with urethral dilatation 02/26/2012    Procedure: CYSTOSCOPY WITH URETHRAL DILATATION;  Surgeon: Kathi Ludwig, MD;  Location: [facility];  Service: Urology;  Laterality: N/A;  cysto, urethral dilation and possible holmium laser of stricture c arm, laser  . Transurethral resection of prostate 02/26/2012    Procedure: TRANSURETHRAL RESECTION OF THE PROSTATE (TURP);  Surgeon: Kathi Ludwig, MD;  Location: [facility];  Service: Urology;;    Family History  Problem Relation Age of Onset  . Hypertension      History  Substance Use Topics  .  Smoking status: Former Smoker    Types: Cigarettes    Quit date: 11/24/1968  . Smokeless tobacco: Never Used  . Alcohol Use: No      Review of Systems  All other systems reviewed and are negative.    Allergies  Naproxen sodium and Statins  Home Medications   Current Outpatient Rx  Name Route Sig Dispense Refill  . ALLOPURINOL 100 MG PO TABS Oral Take 100 mg by mouth daily at 12 noon.     . ASPIRIN EC 81 MG PO TBEC Oral Take 81 mg by mouth every morning.    Marland Kitchen CETIRIZINE HCL 10 MG PO TABS Oral Take 10 mg by mouth daily as needed. For allergies    . CEFTRIAXONE  1 GM IVPB MIXTURE Intravenous Inject 1 g into the vein daily.      Marland Kitchen DIAZEPAM 5 MG PO TABS Oral Take 5 mg by mouth every 6 (six) hours as needed. For anxiety and procedures    . GABAPENTIN 100 MG PO CAPS Oral Take 100-200 mg by mouth 4 (four) times daily. 1 tab in the morning, 1 tab at lunch, 1 tab in the evening, and 2 tabs at bedtime    . HYDROCHLOROTHIAZIDE 25 MG PO TABS Oral Take 12.5 mg by mouth 2 (two) times daily. Take one-half by mouth twice a day    . HYDROCODONE-ACETAMINOPHEN 5-500 MG PO TABS Oral Take 1 tablet by mouth every 6 (six) hours as needed. For pain    . LISINOPRIL 2.5 MG PO TABS Oral Take 2.5 mg by mouth daily at 12 noon.    Marland Kitchen MAGNESIUM OXIDE 400 MG PO TABS Oral Take 400 mg by mouth daily at 12 noon.     Marland Kitchen METOPROLOL TARTRATE 50 MG PO TABS Oral Take 1 tablet (50 mg total) by mouth 2 (two) times daily. 12 tablet 1  . ADULT MULTIVITAMIN W/MINERALS CH Oral Take 1 tablet by mouth daily.    Marland Kitchen OMEPRAZOLE MAGNESIUM 20 MG PO TBEC Oral Take 20 mg by mouth daily.    Marland Kitchen OVER THE COUNTER MEDICATION Oral Take 2 tablets by mouth daily. Green miracle supplement    . POTASSIUM CHLORIDE CRYS ER 20 MEQ PO TBCR Oral Take 0.5 tablets (10 mEq total) by mouth 2 (two) times daily. 90 tablet 3  . RANITIDINE HCL 150 MG PO CAPS Oral Take 150 mg by mouth at bedtime.     Marland Kitchen NITROGLYCERIN 0.4 MG SL SUBL Sublingual Place 0.4 mg under the tongue every 5 (five) minutes x 3 doses as needed. For chest pain      BP 140/89  Pulse 69  Temp 98.5 F (36.9 C) (Oral)  Resp 18  Ht 5\' 9"  (1.753 m)  Wt 172 lb (78.019 kg)  BMI 25.40 kg/m2  SpO2 99%  Physical Exam  Nursing note and vitals reviewed. Constitutional: He is oriented to person, place, and time. He appears well-developed and well-nourished.  HENT:  Head: Normocephalic and atraumatic.  Eyes: EOM are normal.  Neck: Normal range of motion.  Cardiovascular: Normal rate, regular rhythm, normal heart sounds and intact distal pulses.   Pulmonary/Chest: Effort normal and breath sounds normal. No respiratory distress.   Abdominal: Soft. He exhibits no distension. There is no tenderness.  Musculoskeletal: Normal range of motion.       Right upper extremity PICC line with small amount of swelling proximally and some erythema around the insertion site.  Pain with palpation of his right upper extremity  Neurological: He  is alert and oriented to person, place, and time.  Skin: Skin is warm and dry.  Psychiatric: He has a normal mood and affect. Judgment normal.    ED Course  Procedures (including critical care time)  Labs Reviewed  CBC WITH DIFFERENTIAL - Abnormal; Notable for the following:    Hemoglobin 11.4 (*)     HCT 36.7 (*)     MCV 73.4 (*)     MCH 22.8 (*)     RDW 19.1 (*)     All other components within normal limits  POCT I-STAT, CHEM 8 - Abnormal; Notable for the following:    Glucose, Bld 113 (*)     All other components within normal limits  PROTIME-INR  HEPARIN LEVEL (UNFRACTIONATED)   No results found.   1. DVT of upper extremity (deep vein thrombosis)       MDM  Patient with evidence of a proximal DVT in his right internal jugular.  The patient be started on heparin and admitted to the hospital.  He has no symptoms to suggest pulmonary embolism at this time.  Observation will stay.  IV Rocephin given        Lyanne Co, MD 08/20/12 1136

## 2012-08-20 NOTE — Progress Notes (Signed)
Urology Consult   Physician requesting consult: Dr. Standley Dakins  Reason for consult: Urethral stricture  History of Present Illness: Caleb Berry is a 71 y.o. with a reportedly resistant UTI although recent cultures were negative.   He has been treated as an outpatient with IV ceftriaxone via a PICC line.  He was admitted today with an upper extremity DVT and is currently being treated with iv heparin. He feels that his urinary stream is slowing although has been able to void.  He typically catheterizes bid due to his history of a urethral stricture.   Past Medical History  Diagnosis Date  . HTN (hypertension)   . History of prostate cancer     S/P RADICAL PROSTATECTOMY  . Nephrolithiasis   . Stricture of urethra SELF-CATH AS NEEDED    S/P MULTIPLE DILATION'S   . Bladder neck contracture     POST TUI AND UROLUM STENTS  . Status post carotid endarterectomy LEFT  . Myocardial infarction 2006    S/P PTCA W/ X2 DE STENTS  . Status post primary angioplasty with coronary stent 2006-- MI    X2 DRUG-ELUTING STENTS  . CAD (coronary artery disease) CARDIOLOGIST- DR Caleb Berry- VISIT NOTE 09-08-11 IN EPIC    LAST STRESS TEST 2006 ABNORMAL  . DDD (degenerative disc disease)   . History of gout PT STATES STABLE  . GERD (gastroesophageal reflux disease)     CONTROLLED W/ PRILOSEC AND ZANTAC  . Impaired hearing BILATERAL AIDS  . Insomnia   . S/P coronary artery bypass graft x 3   . Peripheral neuropathy FEET  . Urinary retention   . Foley catheter in place     in and out cath prn    Past Surgical History  Procedure Date  . Cysto w/ balloon dilation urethral stricture 05-19-2011  . Transurethral vaporization bladder neck contracture / dilatation urethral stricture 12-26-2010  . Coronary artery bypass graft 2000    X3  . Prostatectomy 1996  . Cystoscopy w/ internal urethrotomy 2003      X2--   W/ UROLUME PROSTHESIS  . Percutaneous nephrotomy 1989  . Extracorporeal shock wave  lithotripsy X5  . Urethral dilatation's MULTIPLE   . Coronary angioplasty 2006    X2 DRUG-ELUTING STENTS  . Cardiac catheterization 12-18-2010 AND 2008  . Carotid endarterectomy 2002    LEFT  . Cystoscopy with urethral dilatation 11/27/2011    Procedure: CYSTOSCOPY WITH URETHRAL DILATATION;  Surgeon: Caleb Ludwig, MD;  Location: [facility];  Service: Urology;  Laterality: N/A;  ROOM # 2 REQUESTED (COOK)   . Cystoscopy with urethral dilatation 02/26/2012    Procedure: CYSTOSCOPY WITH URETHRAL DILATATION;  Surgeon: Caleb Ludwig, MD;  Location: [facility];  Service: Urology;  Laterality: N/A;  cysto, urethral dilation and possible holmium laser of stricture c arm, laser  . Transurethral resection of prostate 02/26/2012    Procedure: TRANSURETHRAL RESECTION OF THE PROSTATE (TURP);  Surgeon: Caleb Ludwig, MD;  Location: [facility];  Service: Urology;;    Current Hospital Medications:  Home Meds: @Hmed @  Scheduled Meds:   . allopurinol  100 mg Oral Daily  . aspirin EC  81 mg Oral q morning - 10a  . cefTRIAXone (ROCEPHIN)  IV  1 g Intravenous Once  . gabapentin  100 mg Oral Custom  . gabapentin  200 mg Oral QHS  . heparin  4,000 Units Intravenous Once  . hydrochlorothiazide  12.5 mg Oral BID  . lisinopril  2.5 mg Oral Daily  . loratadine  10 mg Oral Daily  . magnesium oxide  400 mg Oral Daily  . metoprolol  50 mg Oral BID  . multivitamin with minerals  1 tablet Oral Daily  . pantoprazole  40 mg Oral Q1200  . potassium chloride SA  10 mEq Oral BID  . warfarin  7.5 mg Oral ONCE-1800  . Warfarin - Pharmacist Dosing Inpatient   Does not apply q1800  . DISCONTD: cefTRIAXone (ROCEPHIN) IM  1 g Intramuscular Once  . DISCONTD: gabapentin  100-200 mg Oral QID  . DISCONTD: omeprazole  20 mg Oral Daily   Continuous Infusions:   . heparin 1,300 Units/hr (08/20/12 1331)   PRN Meds:.diazepam, HYDROcodone-acetaminophen,  nitroGLYCERIN, ondansetron (ZOFRAN) IV, ondansetron  Allergies:  Allergies  Allergen Reactions  . Naproxen Sodium Anaphylaxis and Shortness Of Breath    Reaction: Heart attack symptoms  . Statins Other (See Comments)    SEVERE MUSCLE ACHES/ JOINT PAIN    Family History  Problem Relation Age of Onset  . Hypertension      Social History:  reports that he quit smoking about 43 years ago. His smoking use included Cigarettes. He has never used smokeless tobacco. He reports that he does not drink alcohol or use illicit drugs.  ROS: A complete review of systems was performed.  All systems are negative except for pertinent findings as noted.  Physical Exam:  Vital signs in last 24 hours: Temp:  [97.6 F (36.4 C)-98.5 F (36.9 C)] 97.6 F (36.4 C) (09/27 1300) Pulse Rate:  [66-69] 66  (09/27 1300) Resp:  [18] 18  (09/27 1300) BP: (130-140)/(77-89) 130/77 mmHg (09/27 1300) SpO2:  [99 %-100 %] 100 % (09/27 1300) Weight:  [78.019 kg (172 lb)] 78.019 kg (172 lb) (09/27 1300) General:  Alert and oriented, No acute distress HEENT: Normocephalic, atraumatic Neck: No JVD CV: No edema Neurologic: Grossly intact  Laboratory Data:   Basename 08/20/12 1036 08/20/12 1029  WBC -- 7.2  HGB 13.3 11.4*  HCT 39.0 36.7*  PLT -- 355     Basename 08/20/12 1036  NA 136  K 4.0  CL 100  GLUCOSE 113*  BUN 19  CALCIUM --  CREATININE 1.30     Results for orders placed during the hospital encounter of 08/20/12 (from the past 24 hour(s))  CBC WITH DIFFERENTIAL     Status: Abnormal   Collection Time   08/20/12 10:29 AM      Component Value Range   WBC 7.2  4.0 - 10.5 K/uL   RBC 5.00  4.22 - 5.81 MIL/uL   Hemoglobin 11.4 (*) 13.0 - 17.0 g/dL   HCT 16.1 (*) 09.6 - 04.5 %   MCV 73.4 (*) 78.0 - 100.0 fL   MCH 22.8 (*) 26.0 - 34.0 pg   MCHC 31.1  30.0 - 36.0 g/dL   RDW 40.9 (*) 81.1 - 91.4 %   Platelets 355  150 - 400 K/uL   Neutrophils Relative 65  43 - 77 %   Neutro Abs 4.7  1.7 - 7.7  K/uL   Lymphocytes Relative 22  12 - 46 %   Lymphs Abs 1.6  0.7 - 4.0 K/uL   Monocytes Relative 9  3 - 12 %   Monocytes Absolute 0.7  0.1 - 1.0 K/uL   Eosinophils Relative 3  0 - 5 %   Eosinophils Absolute 0.2  0.0 - 0.7 K/uL   Basophils Relative 1  0 - 1 %  Basophils Absolute 0.1  0.0 - 0.1 K/uL  PROTIME-INR     Status: Normal   Collection Time   08/20/12 10:29 AM      Component Value Range   Prothrombin Time 12.9  11.6 - 15.2 seconds   INR 0.98  0.00 - 1.49  POCT I-STAT, CHEM 8     Status: Abnormal   Collection Time   08/20/12 10:36 AM      Component Value Range   Sodium 136  135 - 145 mEq/L   Potassium 4.0  3.5 - 5.1 mEq/L   Chloride 100  96 - 112 mEq/L   BUN 19  6 - 23 mg/dL   Creatinine, Ser 1.61  0.50 - 1.35 mg/dL   Glucose, Bld 096 (*) 70 - 99 mg/dL   Calcium, Ion 0.45  4.09 - 1.30 mmol/L   TCO2 26  0 - 100 mmol/L   Hemoglobin 13.3  13.0 - 17.0 g/dL   HCT 81.1  91.4 - 78.2 %   No results found for this or any previous visit (from the past 240 hour(s)).  Renal Function:  Basename 08/20/12 1036  CREATININE 1.30   Estimated Creatinine Clearance: 52.1 ml/min (by C-G formula based on Cr of 1.3).    Impression/Assessment:  History of urethral stricture disease  Plan:  Caleb Berry will attempt to catheterize this evening with a 14 Fr catheter.  If unsuccessful, I have asked his nurse to call me so that I can catheterize him.  He will then continue nightly catheterization to maintain patency of his urethral stricture.  Haniyah Maciolek,LES 08/20/2012, 6:26 PM    Moody Bruins MD Patient ID: Caleb Berry, male   DOB: Aug 03, 1941, 71 y.o.   MRN: [id number]

## 2012-08-21 DIAGNOSIS — I25810 Atherosclerosis of coronary artery bypass graft(s) without angina pectoris: Secondary | ICD-10-CM

## 2012-08-21 LAB — CBC
HCT: 37.3 % — ABNORMAL LOW (ref 39.0–52.0)
Hemoglobin: 11.5 g/dL — ABNORMAL LOW (ref 13.0–17.0)
MCHC: 30.8 g/dL (ref 30.0–36.0)
Platelets: 370 10*3/uL (ref 150–400)
RBC: 5.07 MIL/uL (ref 4.22–5.81)
RDW: 19.2 % — ABNORMAL HIGH (ref 11.5–15.5)
WBC: 5.5 10*3/uL (ref 4.0–10.5)

## 2012-08-21 LAB — COMPREHENSIVE METABOLIC PANEL
Albumin: 3.3 g/dL — ABNORMAL LOW (ref 3.5–5.2)
BUN: 18 mg/dL (ref 6–23)
Creatinine, Ser: 1.24 mg/dL (ref 0.50–1.35)
GFR calc Af Amer: 66 mL/min — ABNORMAL LOW (ref >90)
Total Protein: 6.7 g/dL (ref 6.0–8.3)

## 2012-08-21 LAB — PROTIME-INR
INR: 1.05 (ref 0.00–1.49)
Prothrombin Time: 13.6 seconds (ref 11.6–15.2)

## 2012-08-21 MED ORDER — WARFARIN SODIUM 7.5 MG PO TABS
7.5000 mg | ORAL_TABLET | Freq: Once | ORAL | Status: AC
  Administered 2012-08-21: 7.5 mg via ORAL
  Filled 2012-08-21: qty 1

## 2012-08-21 NOTE — Progress Notes (Signed)
ANTICOAGULATION CONSULT NOTE - Initial Consult  Pharmacy Consult for heparin Indication: DVT  Allergies  Allergen Reactions  . Naproxen Sodium Anaphylaxis and Shortness Of Breath    Reaction: Heart attack symptoms  . Statins Other (See Comments)    SEVERE MUSCLE ACHES/ JOINT PAIN    Patient Measurements: Height: 5\' 9"  (175.3 cm) Weight: 172 lb (78.019 kg) IBW/kg (Calculated) : 70.7  Heparin Dosing Weight: 77 kg  Vital Signs: Temp: 97.8 F (36.6 C) (09/28 0542) Temp src: Oral (09/28 0542) BP: 100/53 mmHg (09/28 0542) Pulse Rate: 64  (09/28 0542)  Labs:  Basename 08/21/12 0439 08/20/12 2014 08/20/12 1036 08/20/12 1029  HGB 11.5* -- 13.3 --  HCT 37.3* -- 39.0 36.7*  PLT 370 -- -- 355  APTT -- -- -- --  LABPROT 13.6 -- -- 12.9  INR 1.05 -- -- 0.98  HEPARINUNFRC 0.47 0.22* -- --  CREATININE 1.24 -- 1.30 --  CKTOTAL -- -- -- --  CKMB -- -- -- --  TROPONINI -- -- -- --    Estimated Creatinine Clearance: 54.6 ml/min (by C-G formula based on Cr of 1.24).   Medical History: Past Medical History  Diagnosis Date  . HTN (hypertension)   . History of prostate cancer     S/P RADICAL PROSTATECTOMY  . Nephrolithiasis   . Stricture of urethra SELF-CATH AS NEEDED    S/P MULTIPLE DILATION'S   . Bladder neck contracture     POST TUI AND UROLUM STENTS  . Status post carotid endarterectomy LEFT  . Myocardial infarction 2006    S/P PTCA W/ X2 DE STENTS  . Status post primary angioplasty with coronary stent 2006-- MI    X2 DRUG-ELUTING STENTS  . CAD (coronary artery disease) CARDIOLOGIST- DR Riley Kill- VISIT NOTE 09-08-11 IN EPIC    LAST STRESS TEST 2006 ABNORMAL  . DDD (degenerative disc disease)   . History of gout PT STATES STABLE  . GERD (gastroesophageal reflux disease)     CONTROLLED W/ PRILOSEC AND ZANTAC  . Impaired hearing BILATERAL AIDS  . Insomnia   . S/P coronary artery bypass graft x 3   . Peripheral neuropathy FEET  . Urinary retention   . Foley catheter in  place     in and out cath prn    Medications:  Scheduled:     . allopurinol  100 mg Oral Daily  . aspirin EC  81 mg Oral q morning - 10a  . cefTRIAXone (ROCEPHIN)  IV  1 g Intravenous Once  . gabapentin  100 mg Oral Custom  . gabapentin  200 mg Oral QHS  . heparin  1,500 Units Intravenous Once  . heparin  4,000 Units Intravenous Once  . hydrochlorothiazide  12.5 mg Oral BID  . lisinopril  2.5 mg Oral Daily  . loratadine  10 mg Oral Daily  . magnesium oxide  400 mg Oral Daily  . metoprolol  50 mg Oral BID  . multivitamin with minerals  1 tablet Oral Daily  . pantoprazole  40 mg Oral Q1200  . potassium chloride SA  10 mEq Oral BID  . warfarin  7.5 mg Oral ONCE-1800  . Warfarin - Pharmacist Dosing Inpatient   Does not apply q1800  . DISCONTD: cefTRIAXone (ROCEPHIN) IM  1 g Intramuscular Once  . DISCONTD: gabapentin  100-200 mg Oral QID  . DISCONTD: omeprazole  20 mg Oral Daily    Assessment: 44 YOM with PICC in place for IV ceftriaxone therapy as outpatient for treatment of  complicated UTI.  Dopplers reveal DVT in jugular vein and superficial thrombus in basilic vein around PICC. He has h/o hematuria. Labs: Hgb=11.5  plts = 370 . HL = 0.47 units/ml after 1450 units/hr @ Css.  No problems reported per RN.  Goal of Therapy:  Heparin level 0.3-0.7 units/ml Monitor platelets by anticoagulation protocol: Yes   Plan:  -Continue heparin @ current rate (1450 units/hr) -Recheck HL today. - Daily CBC and heparin level - f/u plan for long-term anticoagulation  Lorenza Evangelist PharmD 08/21/2012 5:47 AM

## 2012-08-21 NOTE — Progress Notes (Signed)
[facility] Progress Note  08/21/2012   Subjective: Pt says the pain and swelling in right extremity is improving.  No SOB or CP, he has been able to self cath successfully  Objective:  Vital signs in last 24 hours: Filed Vitals:   08/20/12 1055 08/20/12 1300 08/20/12 2122 08/21/12 0542  BP:  130/77 155/72 100/53  Pulse:  66 89 64  Temp:  97.6 F (36.4 C) 98 F (36.7 C) 97.8 F (36.6 C)  TempSrc:  Oral Oral Oral  Resp:  18 18 16   Height: 5\' 9"  (1.753 m) 5\' 9"  (1.753 m)    Weight: 78.019 kg (172 lb) 78.019 kg (172 lb)    SpO2:  100% 96% 98%   Weight change:   Intake/Output Summary (Last 24 hours) at 08/21/12 1031 Last data filed at 08/21/12 0403  Gross per 24 hour  Intake    480 ml  Output   1202 ml  Net   -722 ml   Lab Results  Component Value Date   HGBA1C 6.8* 04/19/2010   HGBA1C  Value: 6.4 (NOTE)   The ADA recommends the following therapeutic goals for glycemic   control related to Hgb A1C measurement:   Goal of Therapy:   < 7.0% Hgb A1C   Action Suggested:  > 8.0% Hgb A1C   Ref:  Diabetes Care, 22, Suppl. 1, 1999* 10/27/2007   Lab Results  Component Value Date   LDLCALC  Value: 195        Total Cholesterol/HDL:CHD Risk Coronary Heart Disease Risk Table                     Men   Women  1/2 Average Risk   3.4   3.3* 10/27/2007   CREATININE 1.24 08/21/2012    Review of Systems As above, otherwise all reviewed and reported negative  Physical Exam General - awake, no distress, cooperative HEENT - NCAT, MMM Lungs - BBS, CTA CV - normal s1, s2 sounds Abd - soft, nondistended, no masses, nontender Ext - RUE much less edema PICC removed  Lab Results: Results for orders placed during the hospital encounter of 08/20/12 (from the past 24 hour(s))  POCT I-STAT, CHEM 8     Status: Abnormal   Collection Time   08/20/12 10:36 AM      Component Value Range   Sodium 136  135 - 145 mEq/L   Potassium 4.0  3.5 - 5.1 mEq/L   Chloride 100  96 - 112 mEq/L   BUN 19  6 -  23 mg/dL   Creatinine, Ser 4.78  0.50 - 1.35 mg/dL   Glucose, Bld 295 (*) 70 - 99 mg/dL   Calcium, Ion 6.21  3.08 - 1.30 mmol/L   TCO2 26  0 - 100 mmol/L   Hemoglobin 13.3  13.0 - 17.0 g/dL   HCT 65.7  84.6 - 96.2 %  HEPARIN LEVEL (UNFRACTIONATED)     Status: Abnormal   Collection Time   08/20/12  8:14 PM      Component Value Range   Heparin Unfractionated 0.22 (*) 0.30 - 0.70 IU/mL  CBC     Status: Abnormal   Collection Time   08/21/12  4:39 AM      Component Value Range   WBC 5.5  4.0 - 10.5 K/uL   RBC 5.07  4.22 - 5.81 MIL/uL   Hemoglobin 11.5 (*) 13.0 - 17.0 g/dL   HCT 95.2 (*) 84.1 - 32.4 %  MCV 73.6 (*) 78.0 - 100.0 fL   MCH 22.7 (*) 26.0 - 34.0 pg   MCHC 30.8  30.0 - 36.0 g/dL   RDW 16.1 (*) 09.6 - 04.5 %   Platelets 370  150 - 400 K/uL  HEPARIN LEVEL (UNFRACTIONATED)     Status: Normal   Collection Time   08/21/12  4:39 AM      Component Value Range   Heparin Unfractionated 0.47  0.30 - 0.70 IU/mL  COMPREHENSIVE METABOLIC PANEL     Status: Abnormal   Collection Time   08/21/12  4:39 AM      Component Value Range   Sodium 134 (*) 135 - 145 mEq/L   Potassium 4.0  3.5 - 5.1 mEq/L   Chloride 98  96 - 112 mEq/L   CO2 26  19 - 32 mEq/L   Glucose, Bld 115 (*) 70 - 99 mg/dL   BUN 18  6 - 23 mg/dL   Creatinine, Ser 4.09  0.50 - 1.35 mg/dL   Calcium 9.4  8.4 - 81.1 mg/dL   Total Protein 6.7  6.0 - 8.3 g/dL   Albumin 3.3 (*) 3.5 - 5.2 g/dL   AST 15  0 - 37 U/L   ALT 16  0 - 53 U/L   Alkaline Phosphatase 61  39 - 117 U/L   Total Bilirubin 0.2 (*) 0.3 - 1.2 mg/dL   GFR calc non Af Amer 57 (*) >90 mL/min   GFR calc Af Amer 66 (*) >90 mL/min  PROTIME-INR     Status: Normal   Collection Time   08/21/12  4:39 AM      Component Value Range   Prothrombin Time 13.6  11.6 - 15.2 seconds   INR 1.05  0.00 - 1.49    Micro Results: No results found for this or any previous visit (from the past 240 hour(s)).  Medications:  Scheduled Meds:   . allopurinol  100 mg Oral Daily    . aspirin EC  81 mg Oral q morning - 10a  . cefTRIAXone (ROCEPHIN)  IV  1 g Intravenous Once  . gabapentin  100 mg Oral Custom  . gabapentin  200 mg Oral QHS  . heparin  1,500 Units Intravenous Once  . heparin  4,000 Units Intravenous Once  . hydrochlorothiazide  12.5 mg Oral BID  . lisinopril  2.5 mg Oral Daily  . loratadine  10 mg Oral Daily  . magnesium oxide  400 mg Oral Daily  . metoprolol  50 mg Oral BID  . multivitamin with minerals  1 tablet Oral Daily  . pantoprazole  40 mg Oral Q1200  . potassium chloride SA  10 mEq Oral BID  . warfarin  7.5 mg Oral ONCE-1800  . Warfarin - Pharmacist Dosing Inpatient   Does not apply q1800  . DISCONTD: gabapentin  100-200 mg Oral QID  . DISCONTD: omeprazole  20 mg Oral Daily   Continuous Infusions:   . heparin 1,450 Units/hr (08/21/12 0404)  . DISCONTD: heparin 1,300 Units/hr (08/20/12 1331)   PRN Meds:.diazepam, HYDROcodone-acetaminophen, nitroGLYCERIN, ondansetron (ZOFRAN) IV, ondansetron  Assessment/Plan: Cath associated RUE DVT  - continue heparin IV per pharmacy - warfarin per pharmacy - pt says he may be interested in going home on lovenox if PT not therapeutic in next 1-2 days - following PT/INR  Urinary Stricture - appreciate urology consult - pt was able to successfully cath self and will continue daily   GERD - continue pantoprazole  HTN  -  resuming home meds  CAD  - stable, continue home meds   LOS: 1 day   Clanford Johnson 08/21/2012, 10:31 AM  Cleora Fleet, MD, CDE, FAAFP [facility] [place], [place]  [phone]

## 2012-08-21 NOTE — Progress Notes (Signed)
ANTICOAGULATION CONSULT NOTE - Follow Up Consult  Pharmacy Consult for heparin/warfarin D2/5 overlap Indication: DVT  Allergies  Allergen Reactions  . Naproxen Sodium Anaphylaxis and Shortness Of Breath    Reaction: Heart attack symptoms  . Statins Other (See Comments)    SEVERE MUSCLE ACHES/ JOINT PAIN    Patient Measurements: Height: 5\' 9"  (175.3 cm) Weight: 172 lb (78.019 kg) IBW/kg (Calculated) : 70.7  Heparin Dosing Weight: 77 kg  Vital Signs: Temp: 97.8 F (36.6 C) (09/28 0542) Temp src: Oral (09/28 0542) BP: 100/53 mmHg (09/28 0542) Pulse Rate: 64  (09/28 0542)  Labs:  Basename 08/21/12 1101 08/21/12 0439 08/20/12 2014 08/20/12 1036 08/20/12 1029  HGB -- 11.5* -- 13.3 --  HCT -- 37.3* -- 39.0 36.7*  PLT -- 370 -- -- 355  APTT -- -- -- -- --  LABPROT -- 13.6 -- -- 12.9  INR -- 1.05 -- -- 0.98  HEPARINUNFRC 0.38 0.47 0.22* -- --  CREATININE -- 1.24 -- 1.30 --  CKTOTAL -- -- -- -- --  CKMB -- -- -- -- --  TROPONINI -- -- -- -- --    Estimated Creatinine Clearance: 54.6 ml/min (by C-G formula based on Cr of 1.24).   Medical History: Past Medical History  Diagnosis Date  . HTN (hypertension)   . History of prostate cancer     S/P RADICAL PROSTATECTOMY  . Nephrolithiasis   . Stricture of urethra SELF-CATH AS NEEDED    S/P MULTIPLE DILATION'S   . Bladder neck contracture     POST TUI AND UROLUM STENTS  . Status post carotid endarterectomy LEFT  . Myocardial infarction 2006    S/P PTCA W/ X2 DE STENTS  . Status post primary angioplasty with coronary stent 2006-- MI    X2 DRUG-ELUTING STENTS  . CAD (coronary artery disease) CARDIOLOGIST- DR Riley Kill- VISIT NOTE 09-08-11 IN EPIC    LAST STRESS TEST 2006 ABNORMAL  . DDD (degenerative disc disease)   . History of gout PT STATES STABLE  . GERD (gastroesophageal reflux disease)     CONTROLLED W/ PRILOSEC AND ZANTAC  . Impaired hearing BILATERAL AIDS  . Insomnia   . S/P coronary artery bypass graft x 3   .  Peripheral neuropathy FEET  . Urinary retention   . Foley catheter in place     in and out cath prn    Medications:  Scheduled:     . allopurinol  100 mg Oral Daily  . aspirin EC  81 mg Oral q morning - 10a  . gabapentin  100 mg Oral Custom  . gabapentin  200 mg Oral QHS  . heparin  1,500 Units Intravenous Once  . hydrochlorothiazide  12.5 mg Oral BID  . lisinopril  2.5 mg Oral Daily  . loratadine  10 mg Oral Daily  . magnesium oxide  400 mg Oral Daily  . metoprolol  50 mg Oral BID  . multivitamin with minerals  1 tablet Oral Daily  . pantoprazole  40 mg Oral Q1200  . potassium chloride SA  10 mEq Oral BID  . warfarin  7.5 mg Oral ONCE-1800  . Warfarin - Pharmacist Dosing Inpatient   Does not apply q1800  . DISCONTD: gabapentin  100-200 mg Oral QID  . DISCONTD: omeprazole  20 mg Oral Daily    Assessment:  71 YOM with PICC in place for IV ceftriaxone therapy as outpatient for treatment of complicated UTI.   Dopplers reveal DVT in jugular vein and superficial thrombus  in basilic vein around PICC. He has h/o hematuria.  Heparin level therapeutic x 2 today now  INR subtherapeutic as expected  CBC ok  No reported bleeding per notes   Goal of Therapy:  Heparin level 0.3-0.7 units/ml Monitor platelets by anticoagulation protocol: Yes INR 2-3   Plan:  -Continue heparin @ current rate (1450 units/hr) - Repeat 7.5mg  warfarin today - Daily CBC and heparin level - f/u plan for long-term anticoagulation - Recommend changing to Lovenox 1.5mg /kg (120mg ) SQ q24 to complete required 5 day overlap with warfarin then until INR > 2 x 24hrs   Hessie Knows, PharmD, BCPS Pager [phone] 08/21/2012 12:42 PM

## 2012-08-21 NOTE — Progress Notes (Signed)
Patient was unable to void tonight but was able to catheterize himself with a # 12 Fr Cath(personal supply) and says he feels like he emptied his bladder. Dr.Borden notified.by phone.

## 2012-08-21 NOTE — Progress Notes (Signed)
PATIENT UNABLE TO CATH HIMSELF AFTER VOID, DR. Laural Benes AND DR. BORDEN CALLED, Caleb Reder, RN

## 2012-08-22 LAB — HEPARIN LEVEL (UNFRACTIONATED): Heparin Unfractionated: 0.35 IU/mL (ref 0.30–0.70)

## 2012-08-22 LAB — CBC
HCT: 36 % — ABNORMAL LOW (ref 39.0–52.0)
Hemoglobin: 11 g/dL — ABNORMAL LOW (ref 13.0–17.0)
RBC: 4.89 MIL/uL (ref 4.22–5.81)

## 2012-08-22 LAB — PROTIME-INR: Prothrombin Time: 13.9 seconds (ref 11.6–15.2)

## 2012-08-22 MED ORDER — WARFARIN SODIUM 6 MG PO TABS: 6.0000 mg | ORAL_TABLET | Freq: Every day | ORAL | Status: DC

## 2012-08-22 MED ORDER — ENOXAPARIN SODIUM 120 MG/0.8ML ~~LOC~~ SOLN: 120.0000 mg | Freq: Every day | SUBCUTANEOUS | Status: DC

## 2012-08-22 MED ORDER — ENOXAPARIN (LOVENOX) PATIENT EDUCATION KIT
PACK | Freq: Once | Status: AC
  Administered 2012-08-22: 12:00:00
  Filled 2012-08-22: qty 1

## 2012-08-22 MED ORDER — ENOXAPARIN SODIUM 120 MG/0.8ML ~~LOC~~ SOLN
120.0000 mg | SUBCUTANEOUS | Status: DC
  Administered 2012-08-22: 120 mg via SUBCUTANEOUS
  Filled 2012-08-22: qty 0.8

## 2012-08-22 MED ORDER — WARFARIN SODIUM 7.5 MG PO TABS
7.5000 mg | ORAL_TABLET | Freq: Once | ORAL | Status: DC
  Filled 2012-08-22: qty 1

## 2012-08-22 NOTE — Progress Notes (Signed)
ANTICOAGULATION CONSULT NOTE - Follow Up Consult  Pharmacy Consult for heparin/warfarin D3/5 overlap Indication: DVT  Allergies  Allergen Reactions  . Naproxen Sodium Anaphylaxis and Shortness Of Breath    Reaction: Heart attack symptoms  . Statins Other (See Comments)    SEVERE MUSCLE ACHES/ JOINT PAIN    Patient Measurements: Height: 5\' 9"  (175.3 cm) Weight: 172 lb (78.019 kg) IBW/kg (Calculated) : 70.7  Heparin Dosing Weight: 77 kg  Vital Signs: Temp: 97.9 F (36.6 C) (09/29 0535) Temp src: Oral (09/29 0535) BP: 103/62 mmHg (09/29 0535) Pulse Rate: 64  (09/29 0535)  Labs:  Basename 08/22/12 0347 08/21/12 1101 08/21/12 0439 08/20/12 1036 08/20/12 1029  HGB 11.0* -- 11.5* -- --  HCT 36.0* -- 37.3* 39.0 --  PLT 320 -- 370 -- 355  APTT -- -- -- -- --  LABPROT 13.9 -- 13.6 -- 12.9  INR 1.08 -- 1.05 -- 0.98  HEPARINUNFRC 0.35 0.38 0.47 -- --  CREATININE -- -- 1.24 1.30 --  CKTOTAL -- -- -- -- --  CKMB -- -- -- -- --  TROPONINI -- -- -- -- --    Estimated Creatinine Clearance: 54.6 ml/min (by C-G formula based on Cr of 1.24).   Medical History: Past Medical History  Diagnosis Date  . HTN (hypertension)   . History of prostate cancer     S/P RADICAL PROSTATECTOMY  . Nephrolithiasis   . Stricture of urethra SELF-CATH AS NEEDED    S/P MULTIPLE DILATION'S   . Bladder neck contracture     POST TUI AND UROLUM STENTS  . Status post carotid endarterectomy LEFT  . Myocardial infarction 2006    S/P PTCA W/ X2 DE STENTS  . Status post primary angioplasty with coronary stent 2006-- MI    X2 DRUG-ELUTING STENTS  . CAD (coronary artery disease) CARDIOLOGIST- DR Riley Kill- VISIT NOTE 09-08-11 IN EPIC    LAST STRESS TEST 2006 ABNORMAL  . DDD (degenerative disc disease)   . History of gout PT STATES STABLE  . GERD (gastroesophageal reflux disease)     CONTROLLED W/ PRILOSEC AND ZANTAC  . Impaired hearing BILATERAL AIDS  . Insomnia   . S/P coronary artery bypass graft x 3    . Peripheral neuropathy FEET  . Urinary retention   . Foley catheter in place     in and out cath prn    Medications:  Scheduled:     . allopurinol  100 mg Oral Daily  . aspirin EC  81 mg Oral q morning - 10a  . gabapentin  100 mg Oral Custom  . gabapentin  200 mg Oral QHS  . hydrochlorothiazide  12.5 mg Oral BID  . lisinopril  2.5 mg Oral Daily  . loratadine  10 mg Oral Daily  . magnesium oxide  400 mg Oral Daily  . metoprolol  50 mg Oral BID  . multivitamin with minerals  1 tablet Oral Daily  . pantoprazole  40 mg Oral Q1200  . potassium chloride SA  10 mEq Oral BID  . warfarin  7.5 mg Oral ONCE-1800  . Warfarin - Pharmacist Dosing Inpatient   Does not apply q1800    Assessment:  23 YOM with PICC in place for IV ceftriaxone therapy as outpatient for treatment of complicated UTI.   Dopplers reveal DVT in jugular vein and superficial thrombus in basilic vein around PICC. He has h/o hematuria.  Heparin level therapeutic  INR subtherapeutic as expected after 7.5mg  x 2 doses  CBC ok  No reported bleeding per notes   Goal of Therapy:  Heparin level 0.3-0.7 units/ml Monitor platelets by anticoagulation protocol: Yes INR 2-3   Plan:  -Continue heparin @ current rate (1450 units/hr) - Repeat 7.5mg  warfarin today - Daily CBC and heparin level - f/u plan for long-term anticoagulation - Recommend changing to Lovenox 1.5mg /kg (120mg ) SQ q24 to complete required 5 day overlap with warfarin then until INR > 2 x 24hrs   Hessie Knows, PharmD, BCPS Pager [phone] 08/22/2012 8:03 AM

## 2012-08-22 NOTE — Progress Notes (Signed)
[name] spoke with patient concerning dc planning. Per pt choice [facility] to provide [facility] services upon discharge. CM spoke with [facility] intake rep Jamie at [phone]. Per Asher Muir start of care 08/23/12. Patient states able to afford home lovenox. No other needs specified. [name] to provide tx home & assist with home care.   Caleb Berry [phone]

## 2012-08-22 NOTE — Progress Notes (Signed)
Patient ID: Caleb Berry, male   DOB: 30-Jan-1941, 71 y.o.   MRN: [id number]    Subjective: Pt voided last night and tried to catheterize with 14 Fr Foley afterwards unsuccessfully. Later that evening, he could not void but did successfully void with a 12 Fr catheter. He has since been voiding well and without hematuria despite anticoagulation.  Objective: Vital signs in last 24 hours: Temp:  [97.7 F (36.5 C)-97.9 F (36.6 C)] 97.9 F (36.6 C) (09/29 0535) Pulse Rate:  [64-81] 64  (09/29 0535) Resp:  [16-18] 18  (09/29 0535) BP: (103-126)/(62-79) 103/62 mmHg (09/29 0535) SpO2:  [95 %-97 %] 95 % (09/29 0535)  Intake/Output from previous day: 09/28 0701 - 09/29 0700 In: 840 [P.O.:840] Out: 1900 [Urine:1900] Intake/Output this shift: Total I/O In: -  Out: 700 [Urine:700]  Physical Exam:  General: Alert and oriented  Lab Results:  Basename 08/22/12 0347 08/21/12 0439 08/20/12 1036  HGB 11.0* 11.5* 13.3  HCT 36.0* 37.3* 39.0   BMET  Basename 08/21/12 0439 08/20/12 1036  NA 134* 136  K 4.0 4.0  CL 98 100  CO2 26 --  GLUCOSE 115* 113*  BUN 18 19  CREATININE 1.24 1.30  CALCIUM 9.4 --     Studies/Results: No results found.  Assessment/Plan: - Ok for discharge from urologic perspective. He has been instructed to catheterize nightly with a 12 Fr catheter to maintain patency of his urethral stricture and to call Dr. Patsi Sears if problems arise.   LOS: 2 days   Maricsa Sammons,LES 08/22/2012, 10:08 AM

## 2012-08-22 NOTE — Discharge Summary (Signed)
Physician Discharge Summary  Patient ID: Caleb Berry MRN: [id number] DOB/AGE: 1941-07-09 71 y.o.  Admit date: 08/20/2012 Discharge date: 08/22/2012  Discharge Diagnoses:    PICC line Associated DVT of upper extremity (deep vein thrombosis)  HYPERCHOLESTEROLEMIA  IIA  HYPERTENSION  CAD  PROSTATE CANCER, HX OF  GERD (gastroesophageal reflux disease)  Urethral stricture  Urinary retention  Hearing difficulty  Discharged Condition: good  Hospital Course: From H&P:  History of Present Illness:  Caleb Berry is an 70 y.o. male who was sent to ED by his urologist. He had been receiving IV antibiotics (ceftriaxone) at home for a complicated urinary tract infection. He was scheduled for 5 more days of IV Rocephin. He presents the emergency department today with 24 hours of right upper extremity pain and new redness forming around the insertion site of the right upper extremities PICC line. He has no chest pain shortness of breath. His had no fevers or chills. He reports uncomfortable feeling in his right upper extremity. He is tolerating the antibiotics without difficulty. His pain is mild to moderate in severity. He was seen in ED and [place] was ordered and positive for DVT of right IJ. The patient was started on heparin IV and hospital admission was requested. Pt also suffers from urethral stricture and reports that he has to in/out cath daily and reports that he feels like he needs to have a dilatation done and reports that he would like for his urologist to see him in the hospital.  The patient was admitted and started on IV heparin infusion.  The pharmacy manage the patient's heparin in the hospital.  The patient was also started on oral warfarin.  The patient received 7.5 mg for 2 evenings.  His PT/INR was tested daily and monitored.  His PT is currently subtherapeutic.  The patient decided that he wanted to go home.  The patient was transitioned to subcutaneous Lovenox injections.  The pharmacy  recommended 120 mg daily.  The patient was started on his first Lovenox injection prior to discharge and he was given education from the pharmacist and the nurse regarding Lovenox injections.  The patient decided that he wanted to have his Coumadin managed by the [facility] with Dr. Riley Kill his cardiologist that he's been followed by for 25 years.  The patient will have home health to check his PT tomorrow and Wednesday and have the results sent to the [facility] Dr. Riley Kill or his primary care physician Dr. Cliffton Asters for adjustment.  The patient was given instructions that he will need to have his Lovenox injections discontinued when his INR is greater than 2 for 24 hours.  The patient verbalized understanding.  The patient said that he felt comfortable giving Lovenox injections.  He said that he would be sure and followup with the [facility] with Dr. Riley Kill who he decided he wanted to manage his Coumadin.  The patient was also seen in consultation by Dr. port and with urology.  The patient has chronic urethral stricture requiring dilatations.  He was seen by the urologist and please see consultation notes.  The patient will be continuing self-catheterization at home.  And he will have close follow up with his urologist Dr. Patsi Sears.  Consults: urology  Significant Diagnostic Studies: Right Upper Extremity Doppler Ultrasound Summary: Findings consistent with acute deep vein thrombosis involving the right jugular vein and superficial thrombus in the basilic vein around the PICC.  Treatments: IV heparin drip, oral warfarin, education  Discharge Exam:Pt asking to go home, he says he is comfortable giving lovenox injections at home, he says he will be sure to follow up closely.  No bleeding, no hematuria.  He says he would like to go to [facility] for his warfarin management. He denies CP, arm pain, SOB and any other problems,  He was able to cath himself  yesterday with no problems and will follow up closely with Dr. Patsi Sears.. Blood pressure 103/62, pulse 64, temperature 97.9 F (36.6 C), temperature source Oral, resp. rate 18, height 5\' 9"  (1.753 m), weight 78.019 kg (172 lb), SpO2 95.00%. General - awake, no distress, cooperative  HEENT - NCAT, MMM Lungs - BBS, CTA  CV - normal s1, s2 sounds  Abd - soft, nondistended, no masses, nontender  Ext - RUE no edema  Disposition: 06-Home-Health Care Svc  Check PT/INR tomorrow and Wednesday  Discharge Orders    Future Orders Please Complete By Expires   Increase activity slowly      Discharge instructions      Comments:   Take lovenox injection once daily until PT/INR>2 x 24 hours Take coumadin (warfarin) tablet everyday as directed by physician Home health to come to your home to check you PT/INR on Monday and Wednesday to report results to Dr. Riley Kill ([facility]) or Dr. Cliffton Asters Return or seek medical care if symptoms return, worsen or new problems develop.  No heavy physical activity while on blood thinners Call to get set up with the [facility] on Monday       Medication List     As of 08/22/2012 10:47 AM    STOP taking these medications         dextrose 5 % SOLN 50 mL with cefTRIAXone 1 G SOLR 1 g      OVER THE COUNTER MEDICATION      TAKE these medications         allopurinol 100 MG tablet   Commonly known as: ZYLOPRIM   Take 100 mg by mouth daily at 12 noon.      aspirin EC 81 MG tablet   Take 81 mg by mouth every morning.      cetirizine 10 MG tablet   Commonly known as: ZYRTEC   Take 10 mg by mouth daily as needed. For allergies      diazepam 5 MG tablet   Commonly known as: VALIUM   Take 5 mg by mouth every 6 (six) hours as needed. For anxiety and procedures      enoxaparin 120 MG/0.8ML injection   Commonly known as: LOVENOX   Inject 0.8 mLs (120 mg total) into the skin daily.   Start taking on: 08/23/2012      gabapentin 100 MG  capsule   Commonly known as: NEURONTIN   Take 100-200 mg by mouth 4 (four) times daily. 1 tab in the morning, 1 tab at lunch, 1 tab in the evening, and 2 tabs at bedtime      hydrochlorothiazide 25 MG tablet   Commonly known as: HYDRODIURIL   Take 12.5 mg by mouth 2 (two) times daily. Take one-half by mouth twice a day      HYDROcodone-acetaminophen 5-500 MG per tablet   Commonly known as: VICODIN   Take 1 tablet by mouth every 6 (six) hours as needed. For pain      lisinopril 2.5 MG tablet   Commonly known as: PRINIVIL,ZESTRIL   Take 2.5 mg by mouth daily at  12 noon.      magnesium oxide 400 MG tablet   Commonly known as: MAG-OX   Take 400 mg by mouth daily at 12 noon.      metoprolol 50 MG tablet   Commonly known as: LOPRESSOR   Take 1 tablet (50 mg total) by mouth 2 (two) times daily.      multivitamin with minerals Tabs   Take 1 tablet by mouth daily.      nitroGLYCERIN 0.4 MG SL tablet   Commonly known as: NITROSTAT   Place 0.4 mg under the tongue every 5 (five) minutes x 3 doses as needed. For chest pain      omeprazole 20 MG tablet   Commonly known as: PRILOSEC OTC   Take 20 mg by mouth daily.      potassium chloride SA 20 MEQ tablet   Commonly known as: K-DUR,KLOR-CON   Take 0.5 tablets (10 mEq total) by mouth 2 (two) times daily.      ranitidine 150 MG capsule   Commonly known as: ZANTAC   Take 150 mg by mouth at bedtime.      warfarin 6 MG tablet   Commonly known as: COUMADIN   Take 1 tablet (6 mg total) by mouth daily.           Follow-up Information    Follow up with [name], MD. Schedule an appointment as soon as possible for a visit in 1 week. (hospital follow up)    Contact information:   [address] [place] [address] [phone]       Follow up with Jethro Bolus I, MD. Schedule an appointment as soon as possible for a visit in 1 week. (hospital follow up)    Contact information:   [address]  [place] [address] [phone]       Follow up with Shawnie Pons, MD. In 1 week. (PT/ INR check coumadin/warfarin check)    Contact information:   [address] [place] [address] [phone]       Follow up with Home Health To check PT/INR on Monday. In 2 days. (Call results to Dr. Riley Kill or Dr. Laurann Montana)         38 mins spent preparing discharge, reviewing records, arranging for home health care, followup, counseling and educating patient, discussing doses of warfarin and Lovenox with the pharmacist, etc.  Signed: Standley Dakins MD [facility] [place], [place] 08/22/2012, 10:47 AM

## 2012-08-23 ENCOUNTER — Ambulatory Visit: Payer: Self-pay | Admitting: Cardiology

## 2012-08-23 ENCOUNTER — Telehealth: Payer: Self-pay | Admitting: Cardiology

## 2012-08-23 DIAGNOSIS — I82629 Acute embolism and thrombosis of deep veins of unspecified upper extremity: Secondary | ICD-10-CM

## 2012-08-23 DIAGNOSIS — Z7901 Long term (current) use of anticoagulants: Secondary | ICD-10-CM | POA: Insufficient documentation

## 2012-08-23 LAB — POCT INR: INR: 1.3

## 2012-08-23 NOTE — Telephone Encounter (Signed)
New problem:   Recently discharged from [facility]. Need to discuss blood clot issues.

## 2012-08-23 NOTE — Telephone Encounter (Signed)
I spoke with the pt and he made me aware of the information about his hospitalization. The pt had a PICC line in place due to needing IV antibiotics. The pt is currently doing Lovenox and has been started on Coumadin.  The pt is having INR monitored.  I have scheduled the pt to see Dr Riley Kill on 08/30/12 per discharge summary.

## 2012-08-26 ENCOUNTER — Ambulatory Visit: Payer: Self-pay | Admitting: Cardiology

## 2012-08-26 DIAGNOSIS — Z7901 Long term (current) use of anticoagulants: Secondary | ICD-10-CM

## 2012-08-26 DIAGNOSIS — I82629 Acute embolism and thrombosis of deep veins of unspecified upper extremity: Secondary | ICD-10-CM

## 2012-08-26 LAB — POCT INR: INR: 1.8

## 2012-08-30 ENCOUNTER — Encounter: Payer: Self-pay | Admitting: Cardiology

## 2012-08-30 ENCOUNTER — Ambulatory Visit (INDEPENDENT_AMBULATORY_CARE_PROVIDER_SITE_OTHER): Payer: Medicare Other | Admitting: Cardiology

## 2012-08-30 VITALS — BP 116/72 | HR 64 | Ht 68.0 in | Wt 172.0 lb

## 2012-08-30 DIAGNOSIS — I82409 Acute embolism and thrombosis of unspecified deep veins of unspecified lower extremity: Secondary | ICD-10-CM

## 2012-08-30 DIAGNOSIS — I2510 Atherosclerotic heart disease of native coronary artery without angina pectoris: Secondary | ICD-10-CM

## 2012-08-30 DIAGNOSIS — I10 Essential (primary) hypertension: Secondary | ICD-10-CM

## 2012-08-30 DIAGNOSIS — Z79899 Other long term (current) drug therapy: Secondary | ICD-10-CM

## 2012-08-30 LAB — BASIC METABOLIC PANEL
Calcium: 9.4 mg/dL (ref 8.4–10.5)
Creatinine, Ser: 1.4 mg/dL (ref 0.4–1.5)
GFR: 53.96 mL/min — ABNORMAL LOW (ref >60.00)
Sodium: 134 mEq/L — ABNORMAL LOW (ref 135–145)

## 2012-08-30 LAB — HEMOGLOBIN A1C: Hgb A1c MFr Bld: 7.1 % — ABNORMAL HIGH (ref 4.6–6.5)

## 2012-08-30 NOTE — Progress Notes (Signed)
HPI:  Patient is in for followup. He recently had a complicated course. Please review the hospital records. 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 He is now on warfarin anticoagulation. He developed an infection related to a urinary catheter. He is now stable. We discussed with him today specific activities which he can and cannot do. He is otherwise stable, and has not had shortness of breath or other major symptoms. He denies any ongoing chest pain. He is now on warfarin, and low-dose aspirin.  Current Outpatient Prescriptions  Medication Sig Dispense Refill  . allopurinol (ZYLOPRIM) 100 MG tablet Take 100 mg by mouth daily at 12 noon.       Marland Kitchen aspirin EC 81 MG tablet Take 81 mg by mouth every morning.      . cetirizine (ZYRTEC) 10 MG tablet Take 10 mg by mouth daily as needed. For allergies      . diazepam (VALIUM) 5 MG tablet Take 5 mg by mouth every 6 (six) hours as needed. For anxiety and procedures      . gabapentin (NEURONTIN) 100 MG capsule Take 100-200 mg by mouth 4 (four) times daily. 1 tab in the morning, 1 tab at lunch, 1 tab in the evening, and 2 tabs at bedtime      . hydrochlorothiazide (HYDRODIURIL) 25 MG tablet Take 12.5 mg by mouth 2 (two) times daily. Take one-half by mouth twice a day      . HYDROcodone-acetaminophen (VICODIN) 5-500 MG per tablet Take 1 tablet by mouth every 6 (six) hours as needed. For pain      . lisinopril (PRINIVIL,ZESTRIL) 2.5 MG tablet Take 2.5 mg by mouth daily at 12 noon.      . magnesium oxide (MAG-OX) 400 MG tablet Take 400 mg by mouth daily at 12 noon.       . metoprolol (LOPRESSOR) 50 MG tablet Take 1 tablet (50 mg total) by mouth 2 (two) times daily.  12 tablet  1  . Multiple Vitamin (MULTIVITAMIN WITH MINERALS) TABS Take 1 tablet by mouth daily.      . nitroGLYCERIN (NITROSTAT) 0.4 MG SL tablet Place 0.4 mg under the tongue every 5 (five) minutes x 3 doses as needed. For chest pain      . omeprazole  (PRILOSEC OTC) 20 MG tablet Take 20 mg by mouth daily.      . potassium chloride SA (K-DUR,KLOR-CON) 20 MEQ tablet Take 0.5 tablets (10 mEq total) by mouth 2 (two) times daily.  90 tablet  3  . ranitidine (ZANTAC) 150 MG capsule Take 150 mg by mouth at bedtime.       Marland Kitchen warfarin (COUMADIN) 6 MG tablet Take 1 tablet (6 mg total) by mouth daily.  14 tablet  0  . zolpidem (AMBIEN) 10 MG tablet As directed      . DISCONTD: mometasone (NASONEX) 50 MCG/ACT nasal spray 2 sprays by Nasal route as needed.         Allergies  Allergen Reactions  . Naproxen Sodium Anaphylaxis and Shortness Of Breath    Reaction: Heart attack symptoms  . Statins Other (See Comments)    SEVERE MUSCLE ACHES/ JOINT PAIN    Past Medical History  Diagnosis Date  . HTN (hypertension)   . History of prostate cancer     S/P RADICAL PROSTATECTOMY  . Nephrolithiasis   . Stricture of urethra SELF-CATH AS NEEDED    S/P MULTIPLE DILATION'S   . Bladder neck contracture  POST TUI AND UROLUM STENTS  . Status post carotid endarterectomy LEFT  . Myocardial infarction 2006    S/P PTCA W/ X2 DE STENTS  . Status post primary angioplasty with coronary stent 2006-- MI    X2 DRUG-ELUTING STENTS  . CAD (coronary artery disease) CARDIOLOGIST- DR Riley Kill- VISIT NOTE 09-08-11 IN EPIC    LAST STRESS TEST 2006 ABNORMAL  . DDD (degenerative disc disease)   . History of gout PT STATES STABLE  . GERD (gastroesophageal reflux disease)     CONTROLLED W/ PRILOSEC AND ZANTAC  . Impaired hearing BILATERAL AIDS  . Insomnia   . S/P coronary artery bypass graft x 3   . Peripheral neuropathy FEET  . Urinary retention   . Foley catheter in place     in and out cath prn    Past Surgical History  Procedure Date  . Cysto w/ balloon dilation urethral stricture 05-19-2011  . Transurethral vaporization bladder neck contracture / dilatation urethral stricture 12-26-2010  . Coronary artery bypass graft 2000    X3  . Prostatectomy 1996  .  Cystoscopy w/ internal urethrotomy 2003      X2--   W/ UROLUME PROSTHESIS  . Percutaneous nephrotomy 1989  . Extracorporeal shock wave lithotripsy X5  . Urethral dilatation's MULTIPLE   . Coronary angioplasty 2006    X2 DRUG-ELUTING STENTS  . Cardiac catheterization 12-18-2010 AND 2008  . Carotid endarterectomy 2002    LEFT  . Cystoscopy with urethral dilatation 11/27/2011    Procedure: CYSTOSCOPY WITH URETHRAL DILATATION;  Surgeon: Kathi Ludwig, MD;  Location: [facility];  Service: Urology;  Laterality: N/A;  ROOM # 2 REQUESTED (COOK)   . Cystoscopy with urethral dilatation 02/26/2012    Procedure: CYSTOSCOPY WITH URETHRAL DILATATION;  Surgeon: Kathi Ludwig, MD;  Location: [facility];  Service: Urology;  Laterality: N/A;  cysto, urethral dilation and possible holmium laser of stricture c arm, laser  . Transurethral resection of prostate 02/26/2012    Procedure: TRANSURETHRAL RESECTION OF THE PROSTATE (TURP);  Surgeon: Kathi Ludwig, MD;  Location: [facility];  Service: Urology;;    Family History  Problem Relation Age of Onset  . Hypertension      History   Social History  . Marital Status: Married    Spouse Name: N/A    Number of Children: N/A  . Years of Education: N/A   Occupational History  . pastor    Social History Main Topics  . Smoking status: Former Smoker    Types: Cigarettes    Quit date: 11/24/1968  . Smokeless tobacco: Never Used  . Alcohol Use: No  . Drug Use: No  . Sexually Active: No   Other Topics Concern  . Not on file   Social History Narrative  . No narrative on file    ROS: Please see the HPI.  All other systems reviewed and negative.  PHYSICAL EXAM:  BP 116/72  Pulse 64  Ht 5\' 8"  (1.727 m)  Wt 172 lb (78.019 kg)  BMI 26.15 kg/m2  General: Well developed, well nourished, in no acute distress. Head:  Normocephalic and atraumatic. Neck: no JVD Lungs: Clear to  auscultation and percussion. Heart: Normal S1 and S2.  No murmur, rubs or gallops.  Abdomen:  Normal bowel sounds; soft; non tender; no organomegaly Pulses: Pulses normal in all 4 extremities. Extremities: No clubbing or cyanosis. No edema.  No obvious swelling in the upper extremities.   Neurologic:  Alert and oriented x 3.  EKG:    ASSESSMENT AND PLAN:

## 2012-08-30 NOTE — Assessment & Plan Note (Signed)
He is been stable from this standpoint. We'll continue to monitor him.

## 2012-08-30 NOTE — Patient Instructions (Addendum)
Your physician recommends that you schedule a follow-up appointment in: 4 WEEKS  Your physician has requested that you have a upper extremity venous duplex in 4 WEEKS. This test is an ultrasound of the veins in the arms. It looks at venous blood flow that carries blood from the heart to the arms.  Allow thirty minutes for an Upper Venous exam. There are no restrictions or special instructions.  Your physician recommends that you continue on your current medications as directed. Please refer to the Current Medication list given to you today.  Your physician recommends that you have lab work today: BMP and HgbA1c

## 2012-08-30 NOTE — Assessment & Plan Note (Signed)
We will see him back in followup in approximately 4 weeks. At that time, an upper extremity Doppler will be performed. He will remain on warfarin. Duration of anticoagulation will be in the range of about 3-6 months. He'll need to be followed closely thereafter. He may resume driving approximately one week. I will hold him off from golf until we have his followup Doppler in 1 month.

## 2012-08-30 NOTE — Assessment & Plan Note (Signed)
The patient is stable at the present time. He is intolerant to statins. He's also tried over any of other medications, and none has been very helpful due to side effects. We'll continue to monitor new therapies for possible treatment the future.

## 2012-08-31 ENCOUNTER — Other Ambulatory Visit: Payer: Self-pay | Admitting: *Deleted

## 2012-08-31 ENCOUNTER — Ambulatory Visit: Payer: Self-pay | Admitting: Cardiology

## 2012-08-31 DIAGNOSIS — Z7901 Long term (current) use of anticoagulants: Secondary | ICD-10-CM

## 2012-08-31 DIAGNOSIS — I82629 Acute embolism and thrombosis of deep veins of unspecified upper extremity: Secondary | ICD-10-CM

## 2012-08-31 LAB — POCT INR: INR: 3.2

## 2012-08-31 MED ORDER — WARFARIN SODIUM 6 MG PO TABS: 6.0000 mg | ORAL_TABLET | ORAL | Status: DC

## 2012-09-07 ENCOUNTER — Ambulatory Visit: Payer: Self-pay | Admitting: Cardiology

## 2012-09-07 DIAGNOSIS — Z7901 Long term (current) use of anticoagulants: Secondary | ICD-10-CM

## 2012-09-07 DIAGNOSIS — I82629 Acute embolism and thrombosis of deep veins of unspecified upper extremity: Secondary | ICD-10-CM

## 2012-09-16 ENCOUNTER — Ambulatory Visit (INDEPENDENT_AMBULATORY_CARE_PROVIDER_SITE_OTHER): Payer: Medicare Other | Admitting: *Deleted

## 2012-09-16 DIAGNOSIS — I82629 Acute embolism and thrombosis of deep veins of unspecified upper extremity: Secondary | ICD-10-CM

## 2012-09-16 DIAGNOSIS — Z7901 Long term (current) use of anticoagulants: Secondary | ICD-10-CM

## 2012-09-27 ENCOUNTER — Ambulatory Visit (INDEPENDENT_AMBULATORY_CARE_PROVIDER_SITE_OTHER): Payer: Medicare Other | Admitting: Pharmacist

## 2012-09-27 ENCOUNTER — Ambulatory Visit (INDEPENDENT_AMBULATORY_CARE_PROVIDER_SITE_OTHER): Payer: Medicare Other | Admitting: Cardiology

## 2012-09-27 ENCOUNTER — Encounter (INDEPENDENT_AMBULATORY_CARE_PROVIDER_SITE_OTHER): Payer: Medicare Other

## 2012-09-27 ENCOUNTER — Encounter: Payer: Self-pay | Admitting: Cardiology

## 2012-09-27 VITALS — BP 136/80 | HR 63 | Ht 68.0 in | Wt 178.0 lb

## 2012-09-27 DIAGNOSIS — E7800 Pure hypercholesterolemia, unspecified: Secondary | ICD-10-CM

## 2012-09-27 DIAGNOSIS — I82629 Acute embolism and thrombosis of deep veins of unspecified upper extremity: Secondary | ICD-10-CM

## 2012-09-27 DIAGNOSIS — Z86718 Personal history of other venous thrombosis and embolism: Secondary | ICD-10-CM

## 2012-09-27 DIAGNOSIS — I2510 Atherosclerotic heart disease of native coronary artery without angina pectoris: Secondary | ICD-10-CM

## 2012-09-27 DIAGNOSIS — Z7901 Long term (current) use of anticoagulants: Secondary | ICD-10-CM

## 2012-09-27 DIAGNOSIS — I808 Phlebitis and thrombophlebitis of other sites: Secondary | ICD-10-CM

## 2012-09-27 DIAGNOSIS — I82409 Acute embolism and thrombosis of unspecified deep veins of unspecified lower extremity: Secondary | ICD-10-CM

## 2012-09-27 NOTE — Progress Notes (Signed)
HPI:  Patient seen in followup. He is doing much better. He is pushing get back to golf. He had an upper extremity study today which shows apparently resolution. He denies any ongoing chest pain. He is very careful about his self-catheterization.  Current Outpatient Prescriptions  Medication Sig Dispense Refill  . allopurinol (ZYLOPRIM) 100 MG tablet Take 100 mg by mouth daily at 12 noon.       Marland Kitchen aspirin EC 81 MG tablet Take 81 mg by mouth every morning.      . cetirizine (ZYRTEC) 10 MG tablet Take 10 mg by mouth daily as needed. For allergies      . diazepam (VALIUM) 5 MG tablet Take 5 mg by mouth every 6 (six) hours as needed. For anxiety and procedures      . gabapentin (NEURONTIN) 100 MG capsule 1 tab in the morning, 1 tab at lunch, 1 tab in the evening, and 2 tabs at bedtime      . hydrochlorothiazide (HYDRODIURIL) 25 MG tablet Take one-half by mouth twice a day      . HYDROcodone-acetaminophen (VICODIN) 5-500 MG per tablet Take 1 tablet by mouth every 6 (six) hours as needed. For pain      . lisinopril (PRINIVIL,ZESTRIL) 2.5 MG tablet Take 2.5 mg by mouth daily at 12 noon.      . magnesium oxide (MAG-OX) 400 MG tablet Take 400 mg by mouth daily at 12 noon.       . metoprolol (LOPRESSOR) 50 MG tablet Take 1 tablet (50 mg total) by mouth 2 (two) times daily.  12 tablet  1  . Multiple Vitamin (MULTIVITAMIN WITH MINERALS) TABS Take 1 tablet by mouth daily.      Marland Kitchen omeprazole (PRILOSEC OTC) 20 MG tablet Take 20 mg by mouth daily.      . potassium chloride SA (K-DUR,KLOR-CON) 20 MEQ tablet Take 0.5 tablets (10 mEq total) by mouth 2 (two) times daily.  90 tablet  3  . ranitidine (ZANTAC) 150 MG capsule Take 150 mg by mouth at bedtime.       Marland Kitchen warfarin (COUMADIN) 6 MG tablet Take 1 tablet (6 mg total) by mouth as directed.  40 tablet  3  . zolpidem (AMBIEN) 10 MG tablet As directed      . nitroGLYCERIN (NITROSTAT) 0.4 MG SL tablet Place 0.4 mg under the tongue every 5 (five) minutes x 3 doses as  needed. For chest pain      . [DISCONTINUED] mometasone (NASONEX) 50 MCG/ACT nasal spray 2 sprays by Nasal route as needed.         Allergies  Allergen Reactions  . Naproxen Sodium Anaphylaxis and Shortness Of Breath    Reaction: Heart attack symptoms  . Statins Other (See Comments)    SEVERE MUSCLE ACHES/ JOINT PAIN    Past Medical History  Diagnosis Date  . HTN (hypertension)   . History of prostate cancer     S/P RADICAL PROSTATECTOMY  . Nephrolithiasis   . Stricture of urethra SELF-CATH AS NEEDED    S/P MULTIPLE DILATION'S   . Bladder neck contracture     POST TUI AND UROLUM STENTS  . Status post carotid endarterectomy LEFT  . Myocardial infarction 2006    S/P PTCA W/ X2 DE STENTS  . Status post primary angioplasty with coronary stent 2006-- MI    X2 DRUG-ELUTING STENTS  . CAD (coronary artery disease) CARDIOLOGIST- DR Riley Kill- VISIT NOTE 09-08-11 IN EPIC    LAST STRESS TEST  2006 ABNORMAL  . DDD (degenerative disc disease)   . History of gout PT STATES STABLE  . GERD (gastroesophageal reflux disease)     CONTROLLED W/ PRILOSEC AND ZANTAC  . Impaired hearing BILATERAL AIDS  . Insomnia   . S/P coronary artery bypass graft x 3   . Peripheral neuropathy FEET  . Urinary retention   . Foley catheter in place     in and out cath prn    Past Surgical History  Procedure Date  . Cysto w/ balloon dilation urethral stricture 05-19-2011  . Transurethral vaporization bladder neck contracture / dilatation urethral stricture 12-26-2010  . Coronary artery bypass graft 2000    X3  . Prostatectomy 1996  . Cystoscopy w/ internal urethrotomy 2003      X2--   W/ UROLUME PROSTHESIS  . Percutaneous nephrotomy 1989  . Extracorporeal shock wave lithotripsy X5  . Urethral dilatation's MULTIPLE   . Coronary angioplasty 2006    X2 DRUG-ELUTING STENTS  . Cardiac catheterization 12-18-2010 AND 2008  . Carotid endarterectomy 2002    LEFT  . Cystoscopy with urethral dilatation 11/27/2011      Procedure: CYSTOSCOPY WITH URETHRAL DILATATION;  Surgeon: Kathi Ludwig, MD;  Location: [facility];  Service: Urology;  Laterality: N/A;  ROOM # 2 REQUESTED (COOK)   . Cystoscopy with urethral dilatation 02/26/2012    Procedure: CYSTOSCOPY WITH URETHRAL DILATATION;  Surgeon: Kathi Ludwig, MD;  Location: [facility];  Service: Urology;  Laterality: N/A;  cysto, urethral dilation and possible holmium laser of stricture c arm, laser  . Transurethral resection of prostate 02/26/2012    Procedure: TRANSURETHRAL RESECTION OF THE PROSTATE (TURP);  Surgeon: Kathi Ludwig, MD;  Location: [facility];  Service: Urology;;    Family History  Problem Relation Age of Onset  . Hypertension      History   Social History  . Marital Status: Married    Spouse Name: N/A    Number of Children: N/A  . Years of Education: N/A   Occupational History  . pastor    Social History Main Topics  . Smoking status: Former Smoker    Types: Cigarettes    Quit date: 11/24/1968  . Smokeless tobacco: Never Used  . Alcohol Use: No  . Drug Use: No  . Sexually Active: No   Other Topics Concern  . Not on file   Social History Narrative  . No narrative on file    ROS: Please see the HPI.  All other systems reviewed and negative.  PHYSICAL EXAM:  BP 136/80  Pulse 63  Ht 5\' 8"  (1.727 m)  Wt 178 lb (80.74 kg)  BMI 27.06 kg/m2  General: Well developed, well nourished, in no acute distress. Head:  Normocephalic and atraumatic. Neck: no JVD Lungs: Clear to auscultation and percussion. Heart: Normal S1 and S2.  No murmur, rubs or gallops.  Pulses: Pulses normal in all 4 extremities. Extremities: No clubbing or cyanosis. No edema. Neurologic: Alert and oriented x 3.  EKG:  ASSESSMENT AND PLAN:

## 2012-09-27 NOTE — Patient Instructions (Addendum)
Your physician recommends that you schedule a follow-up appointment in: 6 WEEKS  Your physician recommends that you continue on your current medications as directed. Please refer to the Current Medication list given to you today.  

## 2012-10-11 NOTE — Assessment & Plan Note (Signed)
Seems improved by doppler.  Wants to swing a golf club.  Encouraged some caution with this.  He will go lightly.

## 2012-10-11 NOTE — Assessment & Plan Note (Signed)
Stable symptoms

## 2012-10-11 NOTE — Assessment & Plan Note (Signed)
Unable to take statins, or other agents.  Multiple attempts.  BMI is pretty well controlled.

## 2012-10-13 ENCOUNTER — Ambulatory Visit (INDEPENDENT_AMBULATORY_CARE_PROVIDER_SITE_OTHER): Payer: Medicare Other | Admitting: *Deleted

## 2012-10-13 DIAGNOSIS — I82629 Acute embolism and thrombosis of deep veins of unspecified upper extremity: Secondary | ICD-10-CM

## 2012-10-13 DIAGNOSIS — Z7901 Long term (current) use of anticoagulants: Secondary | ICD-10-CM

## 2012-11-10 ENCOUNTER — Ambulatory Visit (INDEPENDENT_AMBULATORY_CARE_PROVIDER_SITE_OTHER): Payer: Medicare Other | Admitting: Cardiology

## 2012-11-10 ENCOUNTER — Ambulatory Visit (INDEPENDENT_AMBULATORY_CARE_PROVIDER_SITE_OTHER): Payer: Medicare Other | Admitting: Pharmacist

## 2012-11-10 ENCOUNTER — Encounter: Payer: Self-pay | Admitting: Cardiology

## 2012-11-10 VITALS — BP 148/80 | HR 76 | Ht 68.0 in | Wt 181.2 lb

## 2012-11-10 DIAGNOSIS — I25810 Atherosclerosis of coronary artery bypass graft(s) without angina pectoris: Secondary | ICD-10-CM

## 2012-11-10 DIAGNOSIS — I82629 Acute embolism and thrombosis of deep veins of unspecified upper extremity: Secondary | ICD-10-CM

## 2012-11-10 DIAGNOSIS — I10 Essential (primary) hypertension: Secondary | ICD-10-CM

## 2012-11-10 DIAGNOSIS — Z7901 Long term (current) use of anticoagulants: Secondary | ICD-10-CM

## 2012-11-10 LAB — MAGNESIUM: Magnesium: 1.7 mg/dL (ref 1.5–2.5)

## 2012-11-10 LAB — BASIC METABOLIC PANEL
BUN: 18 mg/dL (ref 6–23)
CO2: 23 mEq/L (ref 19–32)
Chloride: 101 mEq/L (ref 96–112)
Glucose, Bld: 145 mg/dL — ABNORMAL HIGH (ref 70–99)
Potassium: 4.7 mEq/L (ref 3.5–5.1)

## 2012-11-10 NOTE — Progress Notes (Signed)
HPI:  Patient returns today in a followup visit. He is doing really pretty well. He's not had any clinical recurrence of his DVT. He denies any major chest pain he played golf yesterday, and did quite well with this. He's not having exertional shortness of breath.  Current Outpatient Prescriptions  Medication Sig Dispense Refill  . allopurinol (ZYLOPRIM) 100 MG tablet Take 100 mg by mouth daily at 12 noon.       Marland Kitchen aspirin EC 81 MG tablet Take 81 mg by mouth every morning.      . cetirizine (ZYRTEC) 10 MG tablet Take 10 mg by mouth daily as needed. For allergies      . diazepam (VALIUM) 5 MG tablet Take 5 mg by mouth every 6 (six) hours as needed. For anxiety and procedures      . gabapentin (NEURONTIN) 100 MG capsule 1 tab in the morning, 1 tab at lunch, 1 tab in the evening, and 2 tabs at bedtime      . hydrochlorothiazide (HYDRODIURIL) 25 MG tablet Take one-half by mouth twice a day      . HYDROcodone-acetaminophen (VICODIN) 5-500 MG per tablet Take 1 tablet by mouth every 6 (six) hours as needed. For pain      . lidocaine (XYLOCAINE) 2 % jelly       . lisinopril (PRINIVIL,ZESTRIL) 2.5 MG tablet Take 2.5 mg by mouth daily at 12 noon.      . magnesium oxide (MAG-OX) 400 MG tablet Take 400 mg by mouth daily at 12 noon.       . metoprolol (LOPRESSOR) 50 MG tablet Take 1 tablet (50 mg total) by mouth 2 (two) times daily.  12 tablet  1  . Multiple Vitamin (MULTIVITAMIN WITH MINERALS) TABS Take 1 tablet by mouth daily.      . nitroGLYCERIN (NITROSTAT) 0.4 MG SL tablet Place 0.4 mg under the tongue every 5 (five) minutes x 3 doses as needed. For chest pain      . omeprazole (PRILOSEC OTC) 20 MG tablet Take 20 mg by mouth daily.      . potassium chloride SA (K-DUR,KLOR-CON) 20 MEQ tablet Take 0.5 tablets (10 mEq total) by mouth 2 (two) times daily.  90 tablet  3  . ranitidine (ZANTAC) 150 MG capsule Take 150 mg by mouth at bedtime.       Marland Kitchen warfarin (COUMADIN) 6 MG tablet Take 1 tablet (6 mg total)  by mouth as directed.  40 tablet  3  . zolpidem (AMBIEN) 10 MG tablet As directed      . [DISCONTINUED] mometasone (NASONEX) 50 MCG/ACT nasal spray 2 sprays by Nasal route as needed.         Allergies  Allergen Reactions  . Naproxen Sodium Anaphylaxis and Shortness Of Breath    Reaction: Heart attack symptoms  . Statins Other (See Comments)    SEVERE MUSCLE ACHES/ JOINT PAIN    Past Medical History  Diagnosis Date  . HTN (hypertension)   . History of prostate cancer     S/P RADICAL PROSTATECTOMY  . Nephrolithiasis   . Stricture of urethra SELF-CATH AS NEEDED    S/P MULTIPLE DILATION'S   . Bladder neck contracture     POST TUI AND UROLUM STENTS  . Status post carotid endarterectomy LEFT  . Myocardial infarction 2006    S/P PTCA W/ X2 DE STENTS  . Status post primary angioplasty with coronary stent 2006-- MI    X2 DRUG-ELUTING STENTS  . CAD (  coronary artery disease) CARDIOLOGIST- DR Riley Kill- VISIT NOTE 09-08-11 IN EPIC    LAST STRESS TEST 2006 ABNORMAL  . DDD (degenerative disc disease)   . History of gout PT STATES STABLE  . GERD (gastroesophageal reflux disease)     CONTROLLED W/ PRILOSEC AND ZANTAC  . Impaired hearing BILATERAL AIDS  . Insomnia   . S/P coronary artery bypass graft x 3   . Peripheral neuropathy FEET  . Urinary retention   . Foley catheter in place     in and out cath prn    Past Surgical History  Procedure Date  . Cysto w/ balloon dilation urethral stricture 05-19-2011  . Transurethral vaporization bladder neck contracture / dilatation urethral stricture 12-26-2010  . Coronary artery bypass graft 2000    X3  . Prostatectomy 1996  . Cystoscopy w/ internal urethrotomy 2003      X2--   W/ UROLUME PROSTHESIS  . Percutaneous nephrotomy 1989  . Extracorporeal shock wave lithotripsy X5  . Urethral dilatation's MULTIPLE   . Coronary angioplasty 2006    X2 DRUG-ELUTING STENTS  . Cardiac catheterization 12-18-2010 AND 2008  . Carotid endarterectomy  2002    LEFT  . Cystoscopy with urethral dilatation 11/27/2011    Procedure: CYSTOSCOPY WITH URETHRAL DILATATION;  Surgeon: Kathi Ludwig, MD;  Location: [facility];  Service: Urology;  Laterality: N/A;  ROOM # 2 REQUESTED (COOK)   . Cystoscopy with urethral dilatation 02/26/2012    Procedure: CYSTOSCOPY WITH URETHRAL DILATATION;  Surgeon: Kathi Ludwig, MD;  Location: [facility];  Service: Urology;  Laterality: N/A;  cysto, urethral dilation and possible holmium laser of stricture c arm, laser  . Transurethral resection of prostate 02/26/2012    Procedure: TRANSURETHRAL RESECTION OF THE PROSTATE (TURP);  Surgeon: Kathi Ludwig, MD;  Location: [facility];  Service: Urology;;    Family History  Problem Relation Age of Onset  . Hypertension      History   Social History  . Marital Status: Married    Spouse Name: N/A    Number of Children: N/A  . Years of Education: N/A   Occupational History  . pastor    Social History Main Topics  . Smoking status: Former Smoker    Types: Cigarettes    Quit date: 11/24/1968  . Smokeless tobacco: Never Used  . Alcohol Use: No  . Drug Use: No  . Sexually Active: No   Other Topics Concern  . Not on file   Social History Narrative  . No narrative on file    ROS: Please see the HPI.  All other systems reviewed and negative.  PHYSICAL EXAM:  BP 148/80  Pulse 76  Ht 5\' 8"  (1.727 m)  Wt 181 lb 3.2 oz (82.192 kg)  BMI 27.55 kg/m2  SpO2 97%  General: Well developed, well nourished, in no acute distress. Head:  Normocephalic and atraumatic. Neck: no JVD Lungs: Clear to auscultation and percussion. Heart: Normal S1 and S2.  No murmur, rubs or gallops.  Pulses: Pulses normal in all 4 extremities. Extremities: No clubbing or cyanosis. No edema. Neurologic: Alert and oriented x 3.  EKG: NSR.  Inferior Qs.  Leftward axis.    ASSESSMENT AND PLAN:

## 2012-11-10 NOTE — Patient Instructions (Addendum)
Your physician recommends that you continue on your current medications as directed. Please refer to the Current Medication list given to you today.  Your physician recommends that you schedule a follow-up appointment in: March with Dr. Stuckey  

## 2012-11-15 ENCOUNTER — Encounter: Payer: Self-pay | Admitting: Cardiology

## 2012-11-15 NOTE — Telephone Encounter (Signed)
This encounter was created in error - please disregard.

## 2012-11-15 NOTE — Telephone Encounter (Signed)
Pt rtn your call to get test results

## 2012-11-19 ENCOUNTER — Ambulatory Visit (INDEPENDENT_AMBULATORY_CARE_PROVIDER_SITE_OTHER): Payer: Medicare Other | Admitting: *Deleted

## 2012-11-19 DIAGNOSIS — I82629 Acute embolism and thrombosis of deep veins of unspecified upper extremity: Secondary | ICD-10-CM

## 2012-11-19 DIAGNOSIS — Z7901 Long term (current) use of anticoagulants: Secondary | ICD-10-CM

## 2012-11-22 NOTE — Assessment & Plan Note (Signed)
Moderate control. 

## 2012-11-22 NOTE — Assessment & Plan Note (Signed)
He remains stable.  Continue medical therapy.

## 2012-11-22 NOTE — Assessment & Plan Note (Signed)
Doing quite well.  This will need to be reassessed at the six month point.

## 2012-12-09 ENCOUNTER — Ambulatory Visit (INDEPENDENT_AMBULATORY_CARE_PROVIDER_SITE_OTHER): Payer: Medicare Other

## 2012-12-09 DIAGNOSIS — Z7901 Long term (current) use of anticoagulants: Secondary | ICD-10-CM

## 2012-12-09 DIAGNOSIS — I82629 Acute embolism and thrombosis of deep veins of unspecified upper extremity: Secondary | ICD-10-CM

## 2012-12-31 ENCOUNTER — Encounter: Payer: Self-pay | Admitting: Internal Medicine

## 2013-01-10 ENCOUNTER — Ambulatory Visit (INDEPENDENT_AMBULATORY_CARE_PROVIDER_SITE_OTHER): Payer: Medicare Other | Admitting: Pharmacist

## 2013-01-10 DIAGNOSIS — I82629 Acute embolism and thrombosis of deep veins of unspecified upper extremity: Secondary | ICD-10-CM

## 2013-01-10 DIAGNOSIS — Z7901 Long term (current) use of anticoagulants: Secondary | ICD-10-CM

## 2013-01-10 LAB — POCT INR: INR: 1.5

## 2013-01-18 ENCOUNTER — Ambulatory Visit (INDEPENDENT_AMBULATORY_CARE_PROVIDER_SITE_OTHER): Payer: Medicare Other | Admitting: *Deleted

## 2013-01-18 DIAGNOSIS — Z7901 Long term (current) use of anticoagulants: Secondary | ICD-10-CM

## 2013-01-18 DIAGNOSIS — I82629 Acute embolism and thrombosis of deep veins of unspecified upper extremity: Secondary | ICD-10-CM

## 2013-01-18 LAB — POCT INR: INR: 1.5

## 2013-01-20 ENCOUNTER — Ambulatory Visit (INDEPENDENT_AMBULATORY_CARE_PROVIDER_SITE_OTHER): Payer: Medicare Other | Admitting: Gastroenterology

## 2013-01-20 ENCOUNTER — Encounter: Payer: Self-pay | Admitting: Gastroenterology

## 2013-01-20 ENCOUNTER — Telehealth: Payer: Self-pay | Admitting: *Deleted

## 2013-01-20 VITALS — BP 110/76 | HR 80 | Ht 67.5 in | Wt 175.1 lb

## 2013-01-20 DIAGNOSIS — K219 Gastro-esophageal reflux disease without esophagitis: Secondary | ICD-10-CM

## 2013-01-20 DIAGNOSIS — R1310 Dysphagia, unspecified: Secondary | ICD-10-CM

## 2013-01-20 DIAGNOSIS — Z1211 Encounter for screening for malignant neoplasm of colon: Secondary | ICD-10-CM

## 2013-01-20 MED ORDER — NA SULFATE-K SULFATE-MG SULF 17.5-3.13-1.6 GM/177ML PO SOLN: 1.0000 | Freq: Once | ORAL | Status: DC

## 2013-01-20 MED ORDER — PANTOPRAZOLE SODIUM 40 MG PO TBEC: 40.0000 mg | DELAYED_RELEASE_TABLET | Freq: Every day | ORAL | Status: DC

## 2013-01-20 NOTE — Assessment & Plan Note (Signed)
Plan screening colonoscopy-to be done at the same time as upper endoscopy while Coumadin is being held

## 2013-01-20 NOTE — Progress Notes (Signed)
History of Present Illness: Pleasant 72 year old white male referred at the request of Dr. Cliffton Asters for evaluation of dysphagia and for colorectal cancer screening. He's complaining of dysphagia to solids and especially to pills. Despite taking ranitidine and over-the-counter Prilosec he is having intermittent pyrosis.  Last colonoscopy was over 10 years ago. He has no lower GI complaints including change of bowel habits or bleeding. The patient has a history of coronary artery disease, prostate cancer, and urethral strictures. He is on Coumadin. He's had no recent cardiac complaints.    Past Medical History  Diagnosis Date  . HTN (hypertension)   . History of prostate cancer     S/P RADICAL PROSTATECTOMY  . Nephrolithiasis   . Stricture of urethra SELF-CATH AS NEEDED    S/P MULTIPLE DILATION'S   . Bladder neck contracture     POST TUI AND UROLUM STENTS  . Status post carotid endarterectomy LEFT  . Myocardial infarction 2006    S/P PTCA W/ X2 DE STENTS  . Status post primary angioplasty with coronary stent 2006-- MI    X2 DRUG-ELUTING STENTS  . CAD (coronary artery disease) CARDIOLOGIST- DR Riley Kill- VISIT NOTE 09-08-11 IN EPIC    LAST STRESS TEST 2006 ABNORMAL  . DDD (degenerative disc disease)   . History of gout PT STATES STABLE  . GERD (gastroesophageal reflux disease)     CONTROLLED W/ PRILOSEC AND ZANTAC  . Impaired hearing BILATERAL AIDS  . Insomnia   . S/P coronary artery bypass graft x 3   . Peripheral neuropathy FEET  . Urinary retention   . Foley catheter in place     in and out cath prn  . Esophageal disorder     narrowing  . Clotting disorder    Past Surgical History  Procedure Laterality Date  . Cysto w/ balloon dilation urethral stricture  05-19-2011  . Transurethral vaporization bladder neck contracture / dilatation urethral stricture  12-26-2010  . Coronary artery bypass graft  2000    X3  . Prostatectomy  1996  . Cystoscopy w/ internal urethrotomy  2003       X2--   W/ UROLUME PROSTHESIS  . Percutaneous nephrotomy  1989  . Extracorporeal shock wave lithotripsy  X5  . Urethral dilatation's  MULTIPLE   . Coronary angioplasty  2006    X2 DRUG-ELUTING STENTS  . Cardiac catheterization  12-18-2010 AND 2008  . Carotid endarterectomy  2002    LEFT  . Cystoscopy with urethral dilatation  11/27/2011    Procedure: CYSTOSCOPY WITH URETHRAL DILATATION;  Surgeon: Kathi Ludwig, MD;  Location: [facility];  Service: Urology;  Laterality: N/A;  ROOM # 2 REQUESTED   . Cystoscopy with urethral dilatation  02/26/2012    Procedure: CYSTOSCOPY WITH URETHRAL DILATATION;  Surgeon: Kathi Ludwig, MD;  Location[facility];  Service: Urology;  Laterality: N/A;  cysto, urethral dilation and possible holmium laser of stricture   . Transurethral resection of prostate  02/26/2012    Procedure: TRANSURETHRAL RESECTION OF THE PROSTATE (TURP);  Surgeon: Kathi Ludwig, MD;  Location: [facility];  Service: Urology;;   family history includes Heart attack in his father and Heart disease in his mother. Current Outpatient Prescriptions  Medication Sig Dispense Refill  . allopurinol (ZYLOPRIM) 100 MG tablet Take 100 mg by mouth daily at 12 noon.       Marland Kitchen aspirin EC 81 MG tablet Take 81 mg by mouth every morning.      Marland Kitchen  cetirizine (ZYRTEC) 10 MG tablet Take 10 mg by mouth daily as needed. For allergies      . diazepam (VALIUM) 5 MG tablet Take 5 mg by mouth every 6 (six) hours as needed. For anxiety and procedures      . gabapentin (NEURONTIN) 100 MG capsule 1 tab in the morning, 1 tab at lunch, 1 tab in the evening, and 2 tabs at bedtime      . hydrochlorothiazide (HYDRODIURIL) 25 MG tablet Take one-half by mouth twice a day      . HYDROcodone-acetaminophen (VICODIN) 5-500 MG per tablet Take 1 tablet by mouth every 6 (six) hours as needed. For pain      . lidocaine (XYLOCAINE) 2 % jelly       . lisinopril  (PRINIVIL,ZESTRIL) 2.5 MG tablet Take 2.5 mg by mouth daily at 12 noon.      . magnesium oxide (MAG-OX) 400 MG tablet Take 400 mg by mouth daily at 12 noon.       . metoprolol (LOPRESSOR) 50 MG tablet Take 1 tablet (50 mg total) by mouth 2 (two) times daily.  12 tablet  1  . Multiple Vitamin (MULTIVITAMIN WITH MINERALS) TABS Take 1 tablet by mouth daily.      Marland Kitchen omeprazole (PRILOSEC OTC) 20 MG tablet Take 20 mg by mouth daily.      . potassium chloride SA (K-DUR,KLOR-CON) 20 MEQ tablet Take 0.5 tablets (10 mEq total) by mouth 2 (two) times daily.  90 tablet  3  . ranitidine (ZANTAC) 150 MG capsule Take 150 mg by mouth at bedtime.       Marland Kitchen warfarin (COUMADIN) 6 MG tablet Take 1 tablet (6 mg total) by mouth as directed.  40 tablet  3  . zolpidem (AMBIEN) 10 MG tablet As directed      . nitroGLYCERIN (NITROSTAT) 0.4 MG SL tablet Place 0.4 mg under the tongue every 5 (five) minutes x 3 doses as needed. For chest pain      . [DISCONTINUED] mometasone (NASONEX) 50 MCG/ACT nasal spray 2 sprays by Nasal route as needed.        No current facility-administered medications for this visit.   Allergies as of 01/20/2013 - Review Complete 01/20/2013  Allergen Reaction Noted  . Aleve (naproxen sodium) Anaphylaxis and Shortness Of Breath 02/27/2011  . Statins Other (See Comments) 11/26/2011    reports that he quit smoking about 44 years ago. His smoking use included Cigarettes. He smoked 0.00 packs per day. He has never used smokeless tobacco. He reports that he does not drink alcohol or use illicit drugs.     Review of Systems: Pertinent positive and negative review of systems were noted in the above HPI section. All other review of systems were otherwise negative.  Vital signs were reviewed in today's medical record Physical Exam: General: Well developed , well nourished, no acute distress Skin: anicteric Head: Normocephalic and atraumatic Eyes:  sclerae anicteric, EOMI Ears: Normal auditory  acuity Mouth: No deformity or lesions Neck: Supple, no masses or thyromegaly Lungs: Clear throughout to auscultation Heart: Regular rate and rhythm; no murmurs, rubs or bruits Abdomen: Soft, non tender and non distended. No masses, hepatosplenomegaly or hernias noted. Normal Bowel sounds Rectal:deferred Musculoskeletal: Symmetrical with no gross deformities  Skin: No lesions on visible extremities Pulses:  Normal pulses noted Extremities: No clubbing, cyanosis, edema or deformities noted Neurological: Alert oriented x 4, grossly nonfocal Cervical Nodes:  No significant cervical adenopathy Inguinal Nodes: No significant inguinal adenopathy  Psychological:  Alert and cooperative. Normal mood and affect

## 2013-01-20 NOTE — Patient Instructions (Addendum)
You have been scheduled for a colonoscopy with propofol. Please follow written instructions given to you at your visit today.  Please pick up your prep kit at the pharmacy within the next 1-3 days. If you use inhalers (even only as needed) or a CPAP machine, please bring them with you on the day of your procedure.  You will be contaced by our office prior to your procedure for directions on holding your Coumadin/Warfarin.  If you do not hear from our office 1 week prior to your scheduled procedure, please call [phone] to discuss.   Discontinue Ranitidine Discontinue Omeprazole We will send in Protonix for you

## 2013-01-20 NOTE — Telephone Encounter (Signed)
 [facility]  01/20/2013    RE: Caleb Berry DOB: 20-Mar-1941 MRN: [id number]   Dear Dr Tedra Senegal,    We have scheduled the above patient for an endoscopic procedure. Our records show that he is on anticoagulation therapy.   Please advise as to how long the patient may come off his therapy of Coumadin prior to the procedure, which is scheduled for 02/10/2013.  Please fax back/ or route the completed form to Moses Odoherty at [phone].   Sincerely,  Merri Ray

## 2013-01-20 NOTE — Assessment & Plan Note (Addendum)
Symptoms are under moderate control.  Mediations #1 DC ranitidine and omeprazole; protonix 40 mg daily

## 2013-01-20 NOTE — Assessment & Plan Note (Signed)
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  I will check with the patient's PCP or cardiologist to see whether Coumadin can be held.  Risks, alternatives, and complications of the procedure, including bleeding, perforation, and possible need for surgery, were explained to the patient.  Patient's questions were answered.

## 2013-01-31 NOTE — Telephone Encounter (Signed)
Spoke with patient by phone.  He has delayed his procedure until April.  He does want to come off of warfarin.  We will see him next week and plan to stop his medication.  He has a cataract to follow.  TS

## 2013-02-02 ENCOUNTER — Encounter: Payer: Self-pay | Admitting: Gastroenterology

## 2013-02-09 ENCOUNTER — Encounter: Payer: Self-pay | Admitting: Cardiology

## 2013-02-09 ENCOUNTER — Ambulatory Visit (INDEPENDENT_AMBULATORY_CARE_PROVIDER_SITE_OTHER): Payer: Medicare Other | Admitting: Cardiology

## 2013-02-09 ENCOUNTER — Ambulatory Visit (INDEPENDENT_AMBULATORY_CARE_PROVIDER_SITE_OTHER): Payer: Medicare Other | Admitting: *Deleted

## 2013-02-09 VITALS — BP 124/80 | HR 64 | Ht 68.0 in | Wt 178.0 lb

## 2013-02-09 DIAGNOSIS — I2510 Atherosclerotic heart disease of native coronary artery without angina pectoris: Secondary | ICD-10-CM

## 2013-02-09 DIAGNOSIS — I82629 Acute embolism and thrombosis of deep veins of unspecified upper extremity: Secondary | ICD-10-CM

## 2013-02-09 DIAGNOSIS — E7800 Pure hypercholesterolemia, unspecified: Secondary | ICD-10-CM

## 2013-02-09 DIAGNOSIS — N35919 Unspecified urethral stricture, male, unspecified site: Secondary | ICD-10-CM

## 2013-02-09 DIAGNOSIS — Z7901 Long term (current) use of anticoagulants: Secondary | ICD-10-CM

## 2013-02-09 DIAGNOSIS — I82621 Acute embolism and thrombosis of deep veins of right upper extremity: Secondary | ICD-10-CM

## 2013-02-09 LAB — POCT INR: INR: 4

## 2013-02-09 NOTE — Patient Instructions (Addendum)
Your physician recommends that you schedule a follow-up appointment in: March 08, 2013.

## 2013-02-09 NOTE — Progress Notes (Signed)
HPI:  The patient returns in follow up.  He is doing well.  However he is having more urinary issues.  He also has a colonscopy planned as well as two cataracts.  He desperately wants to stop warfarin.  Upper extremity DVT occurred with a pic line six months ago.    Current Outpatient Prescriptions  Medication Sig Dispense Refill  . allopurinol (ZYLOPRIM) 100 MG tablet Take 100 mg by mouth daily at 12 noon.       Marland Kitchen aspirin EC 81 MG tablet Take 81 mg by mouth every morning.      . cetirizine (ZYRTEC) 10 MG tablet Take 10 mg by mouth daily as needed. For allergies      . diazepam (VALIUM) 5 MG tablet Take 5 mg by mouth every 6 (six) hours as needed. For anxiety and procedures      . gabapentin (NEURONTIN) 100 MG capsule 1 tab in the morning, 1 tab at lunch, 1 tab in the evening, and 2 tabs at bedtime      . hydrochlorothiazide (HYDRODIURIL) 25 MG tablet Take one-half by mouth twice a day      . HYDROcodone-acetaminophen (VICODIN) 5-500 MG per tablet Take 1 tablet by mouth every 6 (six) hours as needed. For pain      . lidocaine (XYLOCAINE) 2 % jelly       . lisinopril (PRINIVIL,ZESTRIL) 2.5 MG tablet Take 2.5 mg by mouth daily at 12 noon.      . magnesium oxide (MAG-OX) 400 MG tablet Take 400 mg by mouth daily at 12 noon.       . metoprolol (LOPRESSOR) 50 MG tablet Take 1 tablet (50 mg total) by mouth 2 (two) times daily.  12 tablet  1  . Multiple Vitamin (MULTIVITAMIN WITH MINERALS) TABS Take 1 tablet by mouth daily.      . Na Sulfate-K Sulfate-Mg Sulf (SUPREP BOWEL PREP) SOLN Take 1 kit by mouth once.  1 Bottle  0  . pantoprazole (PROTONIX) 40 MG tablet Take 1 tablet (40 mg total) by mouth daily.  30 tablet  1  . potassium chloride SA (K-DUR,KLOR-CON) 20 MEQ tablet Take 0.5 tablets (10 mEq total) by mouth 2 (two) times daily.  90 tablet  3  . warfarin (COUMADIN) 6 MG tablet Take 1 tablet (6 mg total) by mouth as directed.  40 tablet  3  . zolpidem (AMBIEN) 10 MG tablet As directed      .  nitroGLYCERIN (NITROSTAT) 0.4 MG SL tablet Place 0.4 mg under the tongue every 5 (five) minutes x 3 doses as needed. For chest pain      . [DISCONTINUED] mometasone (NASONEX) 50 MCG/ACT nasal spray 2 sprays by Nasal route as needed.        No current facility-administered medications for this visit.    Allergies  Allergen Reactions  . Aleve (Naproxen Sodium) Anaphylaxis and Shortness Of Breath    Reaction: Heart attack symptoms  . Statins Other (See Comments)    SEVERE MUSCLE ACHES/ JOINT PAIN    Past Medical History  Diagnosis Date  . HTN (hypertension)   . History of prostate cancer     S/P RADICAL PROSTATECTOMY  . Nephrolithiasis   . Stricture of urethra SELF-CATH AS NEEDED    S/P MULTIPLE DILATION'S   . Bladder neck contracture     POST TUI AND UROLUM STENTS  . Status post carotid endarterectomy LEFT  . Myocardial infarction 2006    S/P PTCA W/ X2  DE STENTS  . Status post primary angioplasty with coronary stent 2006-- MI    X2 DRUG-ELUTING STENTS  . CAD (coronary artery disease) CARDIOLOGIST- DR Riley Kill- VISIT NOTE 09-08-11 IN EPIC    LAST STRESS TEST 2006 ABNORMAL  . DDD (degenerative disc disease)   . History of gout PT STATES STABLE  . GERD (gastroesophageal reflux disease)     CONTROLLED W/ PRILOSEC AND ZANTAC  . Impaired hearing BILATERAL AIDS  . Insomnia   . S/P coronary artery bypass graft x 3   . Peripheral neuropathy FEET  . Urinary retention   . Foley catheter in place     in and out cath prn  . Esophageal disorder     narrowing  . Clotting disorder     Past Surgical History  Procedure Laterality Date  . Cysto w/ balloon dilation urethral stricture  05-19-2011  . Transurethral vaporization bladder neck contracture / dilatation urethral stricture  12-26-2010  . Coronary artery bypass graft  2000    X3  . Prostatectomy  1996  . Cystoscopy w/ internal urethrotomy  2003      X2--   W/ UROLUME PROSTHESIS  . Percutaneous nephrotomy  1989  .  Extracorporeal shock wave lithotripsy  X5  . Urethral dilatation's  MULTIPLE   . Coronary angioplasty  2006    X2 DRUG-ELUTING STENTS  . Cardiac catheterization  12-18-2010 AND 2008  . Carotid endarterectomy  2002    LEFT  . Cystoscopy with urethral dilatation  11/27/2011    Procedure: CYSTOSCOPY WITH URETHRAL DILATATION;  Surgeon: Kathi Ludwig, MD;  Location: [facility];  Service: Urology;  Laterality: N/A;  ROOM # 2 REQUESTED   . Cystoscopy with urethral dilatation  02/26/2012    Procedure: CYSTOSCOPY WITH URETHRAL DILATATION;  Surgeon: Kathi Ludwig, MD;  Location[facility];  Service: Urology;  Laterality: N/A;  cysto, urethral dilation and possible holmium laser of stricture   . Transurethral resection of prostate  02/26/2012    Procedure: TRANSURETHRAL RESECTION OF THE PROSTATE (TURP);  Surgeon: Kathi Ludwig, MD;  Location: [facility];  Service: Urology;;    Family History  Problem Relation Age of Onset  . Heart disease Mother   . Heart attack Father     History   Social History  . Marital Status: Married    Spouse Name: N/A    Number of Children: 2  . Years of Education: N/A   Occupational History  . pastor   . MINISTER    Social History Main Topics  . Smoking status: Former Smoker    Types: Cigarettes    Quit date: 11/24/1968  . Smokeless tobacco: Never Used  . Alcohol Use: No  . Drug Use: No  . Sexually Active: No   Other Topics Concern  . Not on file   Social History Narrative  . No narrative on file    ROS: Please see the HPI.  All other systems reviewed and negative.  PHYSICAL EXAM:  BP 124/80  Pulse 64  Ht 5\' 8"  (1.727 m)  Wt 178 lb (80.74 kg)  BMI 27.07 kg/m2  SpO2 97%  General: Well developed, well nourished, in no acute distress. Head:  Normocephalic and atraumatic. Neck: no JVD Lungs: Clear to auscultation and percussion. Heart: Normal S1 and S2.  No murmur, rubs or  gallops.  Upper extremity no evidence of DVT.  Pulses: Pulses normal in all 4 extremities. Extremities: No clubbing or cyanosis.  No edema. Neurologic: Alert and oriented x 3.  EKG:  NSR.  Small inferior Q waves.  Cannot exclude old inferior MI.    ASSESSMENT AND PLAN:  1.  CAD sp CABG  Stable 2   UE  DVT    Six months out--DC warfarin with early fu with me to ensure safety of DC.    Continue medical therapy.

## 2013-02-10 ENCOUNTER — Encounter: Payer: Medicare Other | Admitting: Gastroenterology

## 2013-02-11 NOTE — Assessment & Plan Note (Signed)
Ongoing issue for patient.   

## 2013-02-11 NOTE — Assessment & Plan Note (Signed)
Continues to remain stable.  Monitor

## 2013-02-11 NOTE — Assessment & Plan Note (Signed)
Has tried multiple agents without success.  Statin intolerant.  Encourage diet management.

## 2013-02-11 NOTE — Assessment & Plan Note (Signed)
Discussed at length.  Has had follow up.  Occurred with PIC.  Wants to come off warfarin.  Will dc for now.  Patient encouraged to pay attention to any change.

## 2013-03-08 ENCOUNTER — Encounter: Payer: Self-pay | Admitting: Cardiology

## 2013-03-08 ENCOUNTER — Ambulatory Visit (INDEPENDENT_AMBULATORY_CARE_PROVIDER_SITE_OTHER): Payer: Medicare Other | Admitting: Cardiology

## 2013-03-08 VITALS — BP 130/88 | HR 60 | Ht 68.0 in | Wt 177.0 lb

## 2013-03-08 DIAGNOSIS — I82621 Acute embolism and thrombosis of deep veins of right upper extremity: Secondary | ICD-10-CM

## 2013-03-08 DIAGNOSIS — I25810 Atherosclerosis of coronary artery bypass graft(s) without angina pectoris: Secondary | ICD-10-CM

## 2013-03-08 DIAGNOSIS — I10 Essential (primary) hypertension: Secondary | ICD-10-CM

## 2013-03-08 DIAGNOSIS — I82629 Acute embolism and thrombosis of deep veins of unspecified upper extremity: Secondary | ICD-10-CM

## 2013-03-08 DIAGNOSIS — E7800 Pure hypercholesterolemia, unspecified: Secondary | ICD-10-CM

## 2013-03-08 DIAGNOSIS — I2510 Atherosclerotic heart disease of native coronary artery without angina pectoris: Secondary | ICD-10-CM

## 2013-03-08 LAB — BASIC METABOLIC PANEL
Chloride: 100 mEq/L (ref 96–112)
Creatinine, Ser: 1.3 mg/dL (ref 0.4–1.5)
GFR: 58.24 mL/min — ABNORMAL LOW (ref >60.00)

## 2013-03-08 MED ORDER — LISINOPRIL 2.5 MG PO TABS: 2.5000 mg | ORAL_TABLET | Freq: Every day | ORAL | Status: AC

## 2013-03-08 MED ORDER — METOPROLOL TARTRATE 50 MG PO TABS: 50.0000 mg | ORAL_TABLET | Freq: Two times a day (BID) | ORAL | Status: AC

## 2013-03-08 MED ORDER — POTASSIUM CHLORIDE CRYS ER 20 MEQ PO TBCR: 10.0000 meq | EXTENDED_RELEASE_TABLET | Freq: Two times a day (BID) | ORAL | Status: AC

## 2013-03-08 NOTE — Patient Instructions (Addendum)
Your physician recommends that you have lab work today: BMP  Your physician recommends that you schedule a follow-up appointment in: 3 MONTHS with Dr Antoine Poche (previous pt of Dr Riley Kill)  Your physician recommends that you continue on your current medications as directed. Please refer to the Current Medication list given to you today.

## 2013-03-08 NOTE — Progress Notes (Signed)
HPI:  Caleb Berry continues to do well. From a cardiac standpoint he is stable. He's not developed any upper extremity swelling, and he overall feels good. He's had several minor procedures recently, and no problems with any of them.  Current Outpatient Prescriptions  Medication Sig Dispense Refill  . allopurinol (ZYLOPRIM) 100 MG tablet Take 100 mg by mouth daily at 12 noon.       Marland Kitchen aspirin EC 81 MG tablet Take 81 mg by mouth every morning.      . cetirizine (ZYRTEC) 10 MG tablet Take 10 mg by mouth daily as needed. For allergies      . diazepam (VALIUM) 5 MG tablet Take 5 mg by mouth every 6 (six) hours as needed. For anxiety and procedures      . gabapentin (NEURONTIN) 100 MG capsule 1 tab in the morning, 1 tab at lunch, 1 tab in the evening, and 2 tabs at bedtime      . hydrochlorothiazide (HYDRODIURIL) 25 MG tablet Take one-half by mouth twice a day      . HYDROcodone-acetaminophen (VICODIN) 5-500 MG per tablet Take 1 tablet by mouth every 6 (six) hours as needed. For pain      . lidocaine (XYLOCAINE) 2 % jelly       . lisinopril (PRINIVIL,ZESTRIL) 2.5 MG tablet Take 1 tablet (2.5 mg total) by mouth daily at 12 noon.  90 tablet  3  . magnesium oxide (MAG-OX) 400 MG tablet Take 400 mg by mouth daily at 12 noon.       . metoprolol (LOPRESSOR) 50 MG tablet Take 1 tablet (50 mg total) by mouth 2 (two) times daily.  180 tablet  3  . Multiple Vitamin (MULTIVITAMIN WITH MINERALS) TABS Take 1 tablet by mouth daily.      . Na Sulfate-K Sulfate-Mg Sulf (SUPREP BOWEL PREP) SOLN Take 1 kit by mouth once.  1 Bottle  0  . pantoprazole (PROTONIX) 40 MG tablet Take 1 tablet (40 mg total) by mouth daily.  30 tablet  1  . potassium chloride SA (K-DUR,KLOR-CON) 20 MEQ tablet Take 0.5 tablets (10 mEq total) by mouth 2 (two) times daily.  90 tablet  3  . zolpidem (AMBIEN) 10 MG tablet As directed      . nitroGLYCERIN (NITROSTAT) 0.4 MG SL tablet Place 0.4 mg under the tongue every 5 (five) minutes x 3 doses  as needed. For chest pain      . [DISCONTINUED] mometasone (NASONEX) 50 MCG/ACT nasal spray 2 sprays by Nasal route as needed.        No current facility-administered medications for this visit.    Allergies  Allergen Reactions  . Aleve (Naproxen Sodium) Anaphylaxis and Shortness Of Breath    Reaction: Heart attack symptoms  . Statins Other (See Comments)    SEVERE MUSCLE ACHES/ JOINT PAIN    Past Medical History  Diagnosis Date  . HTN (hypertension)   . History of prostate cancer     S/P RADICAL PROSTATECTOMY  . Nephrolithiasis   . Stricture of urethra SELF-CATH AS NEEDED    S/P MULTIPLE DILATION'S   . Bladder neck contracture     POST TUI AND UROLUM STENTS  . Status post carotid endarterectomy LEFT  . Myocardial infarction 2006    S/P PTCA W/ X2 DE STENTS  . Status post primary angioplasty with coronary stent 2006-- MI    X2 DRUG-ELUTING STENTS  . CAD (coronary artery disease) CARDIOLOGIST- DR Riley Kill- VISIT NOTE 09-08-11 IN  EPIC    LAST STRESS TEST 2006 ABNORMAL  . DDD (degenerative disc disease)   . History of gout PT STATES STABLE  . GERD (gastroesophageal reflux disease)     CONTROLLED W/ PRILOSEC AND ZANTAC  . Impaired hearing BILATERAL AIDS  . Insomnia   . S/P coronary artery bypass graft x 3   . Peripheral neuropathy FEET  . Urinary retention   . Foley catheter in place     in and out cath prn  . Esophageal disorder     narrowing  . Clotting disorder     Past Surgical History  Procedure Laterality Date  . Cysto w/ balloon dilation urethral stricture  05-19-2011  . Transurethral vaporization bladder neck contracture / dilatation urethral stricture  12-26-2010  . Coronary artery bypass graft  2000    X3  . Prostatectomy  1996  . Cystoscopy w/ internal urethrotomy  2003      X2--   W/ UROLUME PROSTHESIS  . Percutaneous nephrotomy  1989  . Extracorporeal shock wave lithotripsy  X5  . Urethral dilatation's  MULTIPLE   . Coronary angioplasty  2006    X2  DRUG-ELUTING STENTS  . Cardiac catheterization  12-18-2010 AND 2008  . Carotid endarterectomy  2002    LEFT  . Cystoscopy with urethral dilatation  11/27/2011    Procedure: CYSTOSCOPY WITH URETHRAL DILATATION;  Surgeon: Kathi Ludwig, MD;  Location: [facility];  Service: Urology;  Laterality: N/A;  ROOM # 2 REQUESTED   . Cystoscopy with urethral dilatation  02/26/2012    Procedure: CYSTOSCOPY WITH URETHRAL DILATATION;  Surgeon: Kathi Ludwig, MD;  Location: [facility];  Service: Urology;  Laterality: N/A;  cysto, urethral dilation and possible holmium laser of stricture   . Transurethral resection of prostate  02/26/2012    Procedure: TRANSURETHRAL RESECTION OF THE PROSTATE (TURP);  Surgeon: Kathi Ludwig, MD;  Location: [facility];  Service: Urology;;    Family History  Problem Relation Age of Onset  . Heart disease Mother   . Heart attack Father     History   Social History  . Marital Status: Married    Spouse Name: N/A    Number of Children: 2  . Years of Education: N/A   Occupational History  . pastor   . MINISTER    Social History Main Topics  . Smoking status: Former Smoker    Types: Cigarettes    Quit date: 11/24/1968  . Smokeless tobacco: Never Used  . Alcohol Use: No  . Drug Use: No  . Sexually Active: No   Other Topics Concern  . Not on file   Social History Narrative  . No narrative on file    ROS: Please see the HPI.  All other systems reviewed and negative.  PHYSICAL EXAM:  BP 130/88  Pulse 60  Ht 5\' 8"  (1.727 m)  Wt 177 lb (80.287 kg)  BMI 26.92 kg/m2  SpO2 96%  General: Well developed, well nourished, in no acute distress. Head:  Normocephalic and atraumatic. Neck: no JVD Lungs: Clear to auscultation and percussion. Heart: Normal S1 and S2.  No murmur, rubs or gallops.  Extremities: No clubbing or cyanosis. No edema. Neurologic: Alert and oriented x 3.  EKG:  NSR.   Inferior infarct, age undetermined.  No acute changes.    ASSESSMENT AND PLAN:

## 2013-03-09 NOTE — Assessment & Plan Note (Signed)
The patient is been intolerant to statins, and/or any other cardiac type of medication

## 2013-03-09 NOTE — Assessment & Plan Note (Signed)
The patient is currently symptom-free

## 2013-03-09 NOTE — Assessment & Plan Note (Signed)
We will check a basic metabolic profile and magnesium today.

## 2013-03-09 NOTE — Assessment & Plan Note (Signed)
This occurred in the setting of a indwelling catheter. The catheter is been out and he has had a 6 month course of anticoagulant treatment. He's not had clinical recurrence, but is been carefully instructed to watch for any potential recurrence.

## 2013-03-10 ENCOUNTER — Encounter: Payer: Medicare Other | Admitting: Gastroenterology

## 2013-04-11 ENCOUNTER — Ambulatory Visit (AMBULATORY_SURGERY_CENTER): Payer: Medicare Other | Admitting: Gastroenterology

## 2013-04-11 ENCOUNTER — Encounter: Payer: Self-pay | Admitting: Gastroenterology

## 2013-04-11 VITALS — BP 109/57 | HR 55 | Temp 97.9°F | Resp 18 | Ht 67.5 in | Wt 175.0 lb

## 2013-04-11 DIAGNOSIS — Z1211 Encounter for screening for malignant neoplasm of colon: Secondary | ICD-10-CM

## 2013-04-11 DIAGNOSIS — R1310 Dysphagia, unspecified: Secondary | ICD-10-CM

## 2013-04-11 DIAGNOSIS — D126 Benign neoplasm of colon, unspecified: Secondary | ICD-10-CM

## 2013-04-11 DIAGNOSIS — K222 Esophageal obstruction: Secondary | ICD-10-CM

## 2013-04-11 DIAGNOSIS — K219 Gastro-esophageal reflux disease without esophagitis: Secondary | ICD-10-CM

## 2013-04-11 MED ORDER — SODIUM CHLORIDE 0.9 % IV SOLN: 500.0000 mL | INTRAVENOUS | Status: DC

## 2013-04-11 NOTE — Progress Notes (Signed)
Patient did not experience any of the following events: a burn prior to discharge; a fall within the facility; wrong site/side/patient/procedure/implant event; or a hospital transfer or hospital admission upon discharge from the facility. (G8907) Patient did not have preoperative order for IV antibiotic SSI prophylaxis. (G8918)  

## 2013-04-11 NOTE — Progress Notes (Addendum)
NO EGG OR SOY ALLERGY. EWM Cold causes instant jerking, like a seizure per pt. ewm Pt states he came off coumadin in march 2014. ewm

## 2013-04-11 NOTE — Patient Instructions (Addendum)
Handouts were given to your care partner on polyps and esophageal dilatation diet.  You may resume your current medications today.  Please call if any questions or concerns.     YOU HAD AN ENDOSCOPIC PROCEDURE TODAY AT THE [facility]: Refer to the procedure report that was given to you for any specific questions about what was found during the examination.  If the procedure report does not answer your questions, please call your gastroenterologist to clarify.  If you requested that your care partner not be given the details of your procedure findings, then the procedure report has been included in a sealed envelope for you to review at your convenience later.  YOU SHOULD EXPECT: Some feelings of bloating in the abdomen. Passage of more gas than usual.  Walking can help get rid of the air that was put into your GI tract during the procedure and reduce the bloating. If you had a lower endoscopy (such as a colonoscopy or flexible sigmoidoscopy) you may notice spotting of blood in your stool or on the toilet paper. If you underwent a bowel prep for your procedure, then you may not have a normal bowel movement for a few days.  DIET:   Drink plenty of fluids but you should avoid alcoholic beverages for 24 hours.  Please follow the dilatation diet the rest of the day.  ACTIVITY: Your care partner should take you home directly after the procedure.  You should plan to take it easy, moving slowly for the rest of the day.  You can resume normal activity the day after the procedure however you should NOT DRIVE or use heavy machinery for 24 hours (because of the sedation medicines used during the test).    SYMPTOMS TO REPORT IMMEDIATELY: A gastroenterologist can be reached at any hour.  During normal business hours, 8:30 AM to 5:00 PM Monday through Friday, call [phone].  After hours and on weekends, please call the GI answering service at [phone] who will take a message and have the  physician on call contact you.   Following lower endoscopy (colonoscopy or flexible sigmoidoscopy):  Excessive amounts of blood in the stool  Significant tenderness or worsening of abdominal pains  Swelling of the abdomen that is new, acute  Fever of 100F or higher  Following upper endoscopy (EGD)  Vomiting of blood or coffee ground material  New chest pain or pain under the shoulder blades  Painful or persistently difficult swallowing  New shortness of breath  Fever of 100F or higher  Black, tarry-looking stools  FOLLOW UP: If any biopsies were taken you will be contacted by phone or by letter within the next 1-3 weeks.  Call your gastroenterologist if you have not heard about the biopsies in 3 weeks.  Our staff will call the home number listed on your records the next business day following your procedure to check on you and address any questions or concerns that you may have at that time regarding the information given to you following your procedure. This is a courtesy call and so if there is no answer at the home number and we have not heard from you through the emergency physician on call, we will assume that you have returned to your regular daily activities without incident.  SIGNATURES/CONFIDENTIALITY: You and/or your care partner have signed paperwork which will be entered into your electronic medical record.  These signatures attest to the fact that that the information above on your After Visit Summary  has been reviewed and is understood.  Full responsibility of the confidentiality of this discharge information lies with you and/or your care-partner. 

## 2013-04-11 NOTE — Op Note (Signed)
[facility] [address] [place], [address]   ENDOSCOPY PROCEDURE REPORT  PATIENT: Caleb, Berry  MR#: [id number] BIRTHDATE: 1941/05/20 , 71  yrs. old GENDER: Male ENDOSCOPIST: Louis Meckel, MD REFERRED BY:  Laurann Montana, M.D. PROCEDURE DATE:  04/11/2013 PROCEDURE:  EGD, diagnostic and Maloney dilation of esophagus ASA CLASS:     Class II INDICATIONS:  Dysphagia. MEDICATIONS: There was residual sedation effect present from prior procedure, MAC sedation, administered by CRNA, and propofol (Diprivan) 50mg  IV TOPICAL ANESTHETIC:  DESCRIPTION OF PROCEDURE: After the risks benefits and alternatives of the procedure were thoroughly explained, informed consent was obtained.  The LB [id number] endoscope was introduced through the mouth and advanced to the third portion of the duodenum. Without limitations.  The instrument was slowly withdrawn as the mucosa was fully examined.      There was early stricture at the GE junction.  The 9.8 mm gastroscope easily traversed the stricture.   The upper, middle and distal third of the esophagus were carefully inspected and no abnormalities were noted.  The z-line was well seen at the GEJ. The endoscope was pushed into the fundus which was normal including a retroflexed view.  The antrum, gastric body, first and second part of the duodenum were unremarkable.  Retroflexed views revealed no abnormalities.     The scope was then withdrawn from the patient.  A #52 French long dilator was passed with moderate resistance. There was no heme.  COMPLICATIONS: There were no complications.  ENDOSCOPIC IMPRESSION:  1. Esophageal stricture-status post [name] dilation  RECOMMENDATIONS: repeat dilation as needed REPEAT EXAM:  eSigned:  Louis Meckel, MD 04/11/2013 12:15 PM   CC:

## 2013-04-11 NOTE — Progress Notes (Signed)
Stable to RR 

## 2013-04-11 NOTE — Op Note (Signed)
 [facility] [address] [place], [address]   COLONOSCOPY PROCEDURE REPORT  PATIENT: Caleb Berry, Caleb Berry  MR#: [id number] BIRTHDATE: 08/14/1941 , 71  yrs. old GENDER: Male ENDOSCOPIST: Louis Meckel, MD REFERRED NF:[id number] Cliffton Asters, M.D. PROCEDURE DATE:  04/11/2013 PROCEDURE:   Colonoscopy with snare polypectomy ASA CLASS:   Class II INDICATIONS: MEDICATIONS: MAC sedation, administered by CRNA and propofol (Diprivan) 150mg  IV  DESCRIPTION OF PROCEDURE:   After the risks benefits and alternatives of the procedure were thoroughly explained, informed consent was obtained.  A digital rectal exam revealed no abnormalities of the rectum.   The LB [id number]  endoscope was introduced through the anus and advanced to the cecum, which was identified by both the appendix and ileocecal valve. No adverse events experienced.   The quality of the prep was excellent using Suprep  The instrument was then slowly withdrawn as the colon was fully examined.      COLON FINDINGS: A sessile polyp measuring 1 cm in size was found in the ascending colon.  A polypectomy was performed using snare cautery.  The resection was complete and the polyp tissue was completely retrieved.   A sessile polyp measuring 8 mm in size was found at the hepatic flexure.  A polypectomy was performed with a cold snare.  The resection was complete and the polyp tissue was completely retrieved.   Three sessile polyps ranging between 3-35mm in size were found in the descending colon.  A polypectomy was performed with a cold snare.  The resection was complete and the polyp tissue was completely retrieved.   The colon mucosa was otherwise normal.  Retroflexed views revealed no abnormalities. The time to cecum=2 minutes 56 seconds.  Withdrawal time=10 minutes 27 seconds.  The scope was withdrawn and the procedure completed. COMPLICATIONS: There were no complications.  ENDOSCOPIC IMPRESSION: 1.    Sessile polyp measuring 1 cm in size was found in the ascending colon; polypectomy was performed using snare cautery 2.   Sessile polyp measuring 8 mm in size was found at the hepatic flexure; polypectomy was performed with a cold snare 3.   Three sessile polyps ranging between 3-34mm in size were found in the descending colon; polypectomy was performed with a cold snare 4.   The colon mucosa was otherwise normal  RECOMMENDATIONS: If the polyp(s) removed today are proven to be adenomatous (pre-cancerous) polyps, you will need a colonoscopy in 3 years. Otherwise you should continue to follow colorectal cancer screening guidelines for "routine risk" patients with a colonoscopy in 10 years.  You will receive a letter within 1-2 weeks with the results of your biopsy as well as final recommendations.  Please call my office if you have not received a letter after 3 weeks.   eSigned:  Louis Meckel, MD 04/11/2013 12:13 PM   cc:   PATIENT NAME:  Caleb Berry, Caleb Berry MR#: [id number]

## 2013-04-12 ENCOUNTER — Telehealth: Payer: Self-pay

## 2013-04-12 NOTE — Telephone Encounter (Signed)
  Follow up Call-  Call back number 04/11/2013  Post procedure Call Back phone  # [phone]  Permission to leave phone message Yes     Patient questions:  Do you have a fever, pain , or abdominal swelling? no Pain Score  0 *  Have you tolerated food without any problems? yes  Have you been able to return to your normal activities? yes  Do you have any questions about your discharge instructions: Diet   no Medications  no Follow up visit  no  Do you have questions or concerns about your Care? no  Actions: * If pain score is 4 or above: No action needed, pain <4.

## 2013-04-19 ENCOUNTER — Encounter: Payer: Self-pay | Admitting: Gastroenterology

## 2013-05-11 ENCOUNTER — Ambulatory Visit: Payer: Self-pay | Admitting: Pharmacist

## 2013-05-11 DIAGNOSIS — Z7901 Long term (current) use of anticoagulants: Secondary | ICD-10-CM

## 2013-06-09 ENCOUNTER — Ambulatory Visit: Payer: Medicare Other | Admitting: Cardiology

## 2013-06-27 ENCOUNTER — Other Ambulatory Visit: Payer: Self-pay | Admitting: Ophthalmology

## 2013-07-26 ENCOUNTER — Encounter: Payer: Self-pay | Admitting: Cardiology

## 2013-07-26 ENCOUNTER — Ambulatory Visit (INDEPENDENT_AMBULATORY_CARE_PROVIDER_SITE_OTHER): Payer: Medicare Other | Admitting: Cardiology

## 2013-07-26 VITALS — BP 132/80 | HR 75 | Ht 67.5 in | Wt 176.0 lb

## 2013-07-26 DIAGNOSIS — I6523 Occlusion and stenosis of bilateral carotid arteries: Secondary | ICD-10-CM

## 2013-07-26 DIAGNOSIS — I658 Occlusion and stenosis of other precerebral arteries: Secondary | ICD-10-CM

## 2013-07-26 DIAGNOSIS — I6529 Occlusion and stenosis of unspecified carotid artery: Secondary | ICD-10-CM

## 2013-07-26 DIAGNOSIS — I25810 Atherosclerosis of coronary artery bypass graft(s) without angina pectoris: Secondary | ICD-10-CM

## 2013-07-26 NOTE — Progress Notes (Signed)
HPI The patient presents as a new patient for me. He was previously seeing Dr. Riley Kill.  He has a history of coronary disease as described below. Since he was last seen he's been doing well. The patient denies any new symptoms such as chest discomfort, neck or arm discomfort. There has been no new shortness of breath, PND or orthopnea. There have been no reported palpitations, presyncope or syncope. He does work in his yard golf daily and with this gets none of his previous angina. His biggest limitation is a difficulty urinating for which he self caths.  He did report recently that he's been drinking more sugar sodas and his hemoglobin A1c is 7. He is working with his primary provider to get this down.   Allergies  Allergen Reactions  . Aleve [Naproxen Sodium] Anaphylaxis and Shortness Of Breath    Reaction: Heart attack symptoms  . Statins Other (See Comments)    SEVERE MUSCLE ACHES/ JOINT PAIN    Current Outpatient Prescriptions  Medication Sig Dispense Refill  . allopurinol (ZYLOPRIM) 100 MG tablet Take 100 mg by mouth daily at 12 noon.       Marland Kitchen aspirin EC 81 MG tablet Take 81 mg by mouth every morning.      . Azelastine HCl 0.15 % SOLN as directed.      . cetirizine (ZYRTEC) 10 MG tablet Take 10 mg by mouth daily as needed. For allergies      . clotrimazole-betamethasone (LOTRISONE) cream as directed.      . diazepam (VALIUM) 5 MG tablet Take 5 mg by mouth every 6 (six) hours as needed. For anxiety and procedures      . gabapentin (NEURONTIN) 100 MG capsule 1 tab in the morning, 1 tab at lunch, 1 tab in the evening, and 2 tabs at bedtime      . hydrochlorothiazide (HYDRODIURIL) 25 MG tablet Take one-half by mouth twice a day      . HYDROcodone-acetaminophen (VICODIN) 5-500 MG per tablet Take 1 tablet by mouth every 6 (six) hours as needed. For pain      . lidocaine (XYLOCAINE) 2 % jelly Apply 1 application topically 3 (three) times daily.       Marland Kitchen lisinopril (PRINIVIL,ZESTRIL) 2.5 MG  tablet Take 1 tablet (2.5 mg total) by mouth daily at 12 noon.  90 tablet  3  . magnesium oxide (MAG-OX) 400 MG tablet Take 400 mg by mouth daily at 12 noon.       . metoprolol (LOPRESSOR) 50 MG tablet Take 1 tablet (50 mg total) by mouth 2 (two) times daily.  180 tablet  3  . Multiple Vitamin (MULTIVITAMIN WITH MINERALS) TABS Take 1 tablet by mouth daily.      . pantoprazole (PROTONIX) 40 MG tablet Take 1 tablet (40 mg total) by mouth daily.  30 tablet  1  . potassium chloride SA (K-DUR,KLOR-CON) 20 MEQ tablet Take 0.5 tablets (10 mEq total) by mouth 2 (two) times daily.  90 tablet  3  . ranitidine (ZANTAC) 150 MG tablet Take 150 mg by mouth at bedtime.      . RESTASIS 0.05 % ophthalmic emulsion as directed.      Marland Kitchen UNABLE TO FIND Take 1 each by mouth daily. Med Name: ELDER BERRY LIQUID-1 TSP DAILY      . VOLTAREN 1 % GEL 2 g 4 (four) times daily.       Marland Kitchen zolpidem (AMBIEN) 10 MG tablet As directed      .  nitroGLYCERIN (NITROSTAT) 0.4 MG SL tablet Place 0.4 mg under the tongue every 5 (five) minutes x 3 doses as needed. For chest pain      . [DISCONTINUED] mometasone (NASONEX) 50 MCG/ACT nasal spray 2 sprays by Nasal route as needed.        No current facility-administered medications for this visit.    Past Medical History  Diagnosis Date  . HTN (hypertension)   . History of prostate cancer     S/P RADICAL PROSTATECTOMY  . Nephrolithiasis   . Stricture of urethra SELF-CATH AS NEEDED    S/P MULTIPLE DILATION'S   . Bladder neck contracture     POST TUI AND UROLUM STENTS  . Status post carotid endarterectomy LEFT  . Myocardial infarction 2006    S/P PTCA W/ X2 DE STENTS  . Status post primary angioplasty with coronary stent 2006-- MI    X2 DRUG-ELUTING STENTS  . CAD (coronary artery disease) CARDIOLOGIST- DR Riley Kill- VISIT NOTE 09-08-11 IN EPIC    LAST STRESS TEST 2006 ABNORMAL  . DDD (degenerative disc disease)   . History of gout PT STATES STABLE  . GERD (gastroesophageal reflux  disease)     CONTROLLED W/ PRILOSEC AND ZANTAC  . Impaired hearing BILATERAL AIDS  . Insomnia   . S/P coronary artery bypass graft x 3   . Urinary retention   . Foley catheter in place     in and out cath prn  . Esophageal disorder     narrowing  . Clotting disorder   . Peripheral neuropathy FEET  . Hiatal hernia   . Kidney stones     Past Surgical History  Procedure Laterality Date  . Cysto w/ balloon dilation urethral stricture  05-19-2011  . Transurethral vaporization bladder neck contracture / dilatation urethral stricture  12-26-2010  . Coronary artery bypass graft  2000    X3  . Prostatectomy  1996  . Cystoscopy w/ internal urethrotomy  2003      X2--   W/ UROLUME PROSTHESIS  . Percutaneous nephrotomy  1989  . Extracorporeal shock wave lithotripsy  X5  . Urethral dilatation's  MULTIPLE   . Coronary angioplasty  2006    X2 DRUG-ELUTING STENTS  . Cardiac catheterization  12-18-2010 AND 2008  . Carotid endarterectomy  2002    LEFT  . Cystoscopy with urethral dilatation  11/27/2011    Procedure: CYSTOSCOPY WITH URETHRAL DILATATION;  Surgeon: Kathi Ludwig, MD;  Location: [facility];  Service: Urology;  Laterality: N/A;  ROOM # 2 REQUESTED   . Cystoscopy with urethral dilatation  02/26/2012    Procedure: CYSTOSCOPY WITH URETHRAL DILATATION;  Surgeon: Kathi Ludwig, MD;  Location[facility];  Service: Urology;  Laterality: N/A;  cysto, urethral dilation and possible holmium laser of stricture   . Transurethral resection of prostate  02/26/2012    Procedure: TRANSURETHRAL RESECTION OF THE PROSTATE (TURP);  Surgeon: Kathi Ludwig, MD;  Location: [facility];  Service: Urology;;  . Colonoscopy    . Polypectomy    . Ear surgeries  1980'S    BUSTED EAR DRUM  . Cataract  02-16-13,02-24-13    BILATERAL    ROS:  As stated in the HPI and negative for all other systems.  PHYSICAL EXAM BP 132/80  Pulse 75  Ht 5'  7.5" (1.715 m)  Wt 176 lb (79.833 kg)  BMI 27.14 kg/m2 GENERAL:  Well appearing NECK:  No jugular venous distention, waveform within  normal limits, carotid upstroke brisk and symmetric, no bruits, no thyromegaly LYMPHATICS:  No cervical, inguinal adenopathy LUNGS:  Clear to auscultation bilaterally BACK:  No CVA tenderness CHEST:  Well healed sternotomy scar. HEART:  PMI not displaced or sustained,S1 and S2 within normal limits, no S3, no S4, no clicks, no rubs, no murmurs ABD:  Flat, positive bowel sounds normal in frequency in pitch, no bruits, no rebound, no guarding, no midline pulsatile mass, no hepatomegaly, no splenomegaly EXT:  2 plus pulses throughout, no edema, no cyanosis no clubbing SKIN:  No rashes no nodules NEURO:  Cranial nerves II through XII grossly intact, motor grossly intact throughout PSYCH:  Cognitively intact, oriented to person place and time   EKG:  Sinus rhythm, rate 75, axis within normal limits, intervals within normal limits, no acute ST-T wave changes. 07/26/2013  ASSESSMENT AND PLAN  CAD/CABG:  It has been several years since his last catheterization so I will order stress testing. We will continue with risk reduction.  DYSLIPIDEMIA:  This is followed by [name], MD.  I will defer to her expertise.  HTN: The blood pressure is at target. No change in medications is indicated. We will continue with therapeutic lifestyle changes (TLC).  CAROTID STENOSIS:  It has been several years since his any kind of screening and I will order carotid Doppler.

## 2013-07-26 NOTE — Patient Instructions (Addendum)
The current medical regimen is effective;  continue present plan and medications.  Your physician has requested that you have an exercise tolerance test. For further information please visit [email or web address] Please also follow instruction sheet, as given.  Your physician has requested that you have a carotid duplex. This test is an ultrasound of the carotid arteries in your neck. It looks at blood flow through these arteries that supply the brain with blood. Allow one hour for this exam. There are no restrictions or special instructions.  Follow up in 6 months with Dr Antoine Poche.  You will receive a letter in the mail 2 months before you are due.  Please call us when you receive this letter to schedule your follow up appointment.

## 2013-07-28 ENCOUNTER — Encounter (INDEPENDENT_AMBULATORY_CARE_PROVIDER_SITE_OTHER): Payer: Medicare Other

## 2013-07-28 DIAGNOSIS — I6529 Occlusion and stenosis of unspecified carotid artery: Secondary | ICD-10-CM

## 2013-07-28 DIAGNOSIS — I6523 Occlusion and stenosis of bilateral carotid arteries: Secondary | ICD-10-CM

## 2013-08-10 ENCOUNTER — Encounter (HOSPITAL_COMMUNITY): Payer: Self-pay | Admitting: Pharmacy Technician

## 2013-08-10 ENCOUNTER — Encounter: Payer: Medicare Other | Admitting: Physician Assistant

## 2013-08-10 ENCOUNTER — Ambulatory Visit (INDEPENDENT_AMBULATORY_CARE_PROVIDER_SITE_OTHER): Payer: Medicare Other | Admitting: Physician Assistant

## 2013-08-10 DIAGNOSIS — R079 Chest pain, unspecified: Secondary | ICD-10-CM

## 2013-08-10 DIAGNOSIS — I25810 Atherosclerosis of coronary artery bypass graft(s) without angina pectoris: Secondary | ICD-10-CM

## 2013-08-10 DIAGNOSIS — R9439 Abnormal result of other cardiovascular function study: Secondary | ICD-10-CM

## 2013-08-10 LAB — BASIC METABOLIC PANEL
BUN: 22 mg/dL (ref 6–23)
CO2: 29 mEq/L (ref 19–32)
Chloride: 99 mEq/L (ref 96–112)
Glucose, Bld: 118 mg/dL — ABNORMAL HIGH (ref 70–99)
Potassium: 4.1 mEq/L (ref 3.5–5.1)

## 2013-08-10 LAB — CBC WITH DIFFERENTIAL/PLATELET
Basophils Absolute: 0.1 10*3/uL (ref 0.0–0.1)
Eosinophils Relative: 1.6 % (ref 0.0–5.0)
HCT: 37.5 % — ABNORMAL LOW (ref 39.0–52.0)
Lymphs Abs: 1.7 10*3/uL (ref 0.7–4.0)
MCHC: 32.3 g/dL (ref 30.0–36.0)
MCV: 80.5 fl (ref 78.0–100.0)
Monocytes Absolute: 0.6 10*3/uL (ref 0.1–1.0)
Platelets: 330 10*3/uL (ref 150.0–400.0)
RDW: 15.6 % — ABNORMAL HIGH (ref 11.5–14.6)

## 2013-08-10 MED ORDER — NITROGLYCERIN 0.4 MG SL SUBL: 0.4000 mg | SUBLINGUAL_TABLET | SUBLINGUAL | Status: AC | PRN

## 2013-08-10 NOTE — Progress Notes (Signed)
Exercise Treadmill Test  Pre-Exercise Testing Evaluation Rhythm: sinus bradycardia  Rate: 59     Test  Exercise Tolerance Test Ordering MD: Angelina Sheriff, MD  Interpreting MD: Tereso Newcomer, PA-C  Unique Test No: 1  Treadmill:  1  Indication for ETT: known ASHD  Contraindication to ETT: No   Stress Modality: exercise - treadmill  Cardiac Imaging Performed: non   Protocol: standard Bruce - maximal  Max BP:  182/72  Max MPHR (bpm):  148 85% MPR (bpm):  126  MPHR obtained (bpm):  123 % MPHR obtained:  83  Reached 85% MPHR (min:sec):  n/a Total Exercise Time (min-sec):  7:59  Workload in METS:  10.0 Borg Scale: 13  Reason ETT Terminated:  ST depression (>73mm)    ST Segment Analysis At Rest: non-specific ST segment slurring With Exercise: significant ischemic ST depression  Other Information Arrhythmia:  No Angina during ETT:  present (1) Quality of ETT:  diagnostic  ETT Interpretation:  abnormal - evidence of ST depression consistent with ischemia  Comments: Fair exercise tolerance. He complained of chest pain consistent with angina.  This resolved in recovery. Normal BP response to exercise. There was significant ST segment depression consistent with ischemia that persisted several minutes into recovery.  Recommendations: Reviewed with Dr. Patty Sermons (DOD). We have recommended cardiac catheterization. Risks and benefits of cardiac catheterization have been discussed with the patient.  These include bleeding, infection, kidney damage, stroke, heart attack, death.  The patient understands these risks and is willing to proceed.  This will be arranged with Dr. [name] this week. Signed,  Tereso Newcomer, PA-C   08/10/2013 5:18 PM

## 2013-08-10 NOTE — Patient Instructions (Addendum)
Your physician has requested that you have a cardiac catheterization. Cardiac catheterization is used to diagnose and/or treat various heart conditions. Doctors may recommend this procedure for a number of different reasons. The most common reason is to evaluate chest pain. Chest pain can be a symptom of coronary artery disease (CAD), and cardiac catheterization can show whether plaque is narrowing or blocking your heart's arteries. This procedure is also used to evaluate the valves, as well as measure the blood flow and oxygen levels in different parts of your heart. For further information please visit [email or web address] Please follow instruction sheet, as given.  Your cardiac cath is scheduled for Thursday 08/11/13 @9am  with Dr.Hochrein   Labs Today: PT/INR, BMP, CBC  A REFILL  RX FOR YOUR NTG HAS BEEN SENT TO YOUR PHARMACY

## 2013-08-10 NOTE — H&P (Signed)
History and Physical   Patient ID: Caleb Berry, MRN: [id number], DOB: 1941/02/03   Date of Encounter: 08/10/2013, 5:19 PM  Primary Physician: [name], MD Cardiologist: Dr. [name]   Chief Complaint:  Abnormal stress test  History of Present Illness: Caleb Berry is a 72 y.o. male with a history of CAD, status post CABG in 2000 and subsequent PCI in 2006 in the setting of myocardial infarction. LHC in 2012 with patent bypass grafts.  Other history includes HTN, prostate cancer, status post radical prostatectomy, GERD.  He does self catheterize himself several times a week. He was recently established with Dr. Antoine Poche. Routine exercise treadmill test was arranged to followup on CAD. Exercise treadmill test was performed today. This demonstrated significant ischemic ST changes. This was reviewed with Dr. Patty Sermons (DOD). We have recommended proceeding with cardiac catheterization. In retrospect, the patient does note decreased exercise tolerance as well as left arm pain with exertion over the last several months.   Past Medical History  Diagnosis Date  . HTN (hypertension)   . History of prostate cancer     S/P RADICAL PROSTATECTOMY  . Nephrolithiasis   . Stricture of urethra SELF-CATH AS NEEDED    S/P MULTIPLE DILATION'S   . Bladder neck contracture     POST TUI AND UROLUM STENTS  . Status post carotid endarterectomy LEFT  . Myocardial infarction 2006    S/P PTCA W/ X2 DE STENTS  . Status post primary angioplasty with coronary stent 2006-- MI    X2 DRUG-ELUTING STENTS  . CAD (coronary artery disease) CARDIOLOGIST- DR Riley Kill- VISIT NOTE 09-08-11 IN EPIC    LAST STRESS TEST 2006 ABNORMAL  . DDD (degenerative disc disease)   . History of gout PT STATES STABLE  . GERD (gastroesophageal reflux disease)     CONTROLLED W/ PRILOSEC AND ZANTAC  . Impaired hearing BILATERAL AIDS  . Insomnia   . S/P coronary artery bypass graft x 3   . Urinary retention   . Foley catheter  in place     in and out cath prn  . Esophageal disorder     narrowing  . Clotting disorder   . Peripheral neuropathy FEET  . Hiatal hernia   . Kidney stones      Past Surgical History  Procedure Laterality Date  . Cysto w/ balloon dilation urethral stricture  05-19-2011  . Transurethral vaporization bladder neck contracture / dilatation urethral stricture  12-26-2010  . Coronary artery bypass graft  2000    X3  . Prostatectomy  1996  . Cystoscopy w/ internal urethrotomy  2003      X2--   W/ UROLUME PROSTHESIS  . Percutaneous nephrotomy  1989  . Extracorporeal shock wave lithotripsy  X5  . Urethral dilatation's  MULTIPLE   . Coronary angioplasty  2006    X2 DRUG-ELUTING STENTS  . Cardiac catheterization  12-18-2010 AND 2008  . Carotid endarterectomy  2002    LEFT  . Cystoscopy with urethral dilatation  11/27/2011    Procedure: CYSTOSCOPY WITH URETHRAL DILATATION;  Surgeon: Kathi Ludwig, MD;  Location: [facility];  Service: Urology;  Laterality: N/A;  ROOM # 2 REQUESTED   . Cystoscopy with urethral dilatation  02/26/2012    Procedure: CYSTOSCOPY WITH URETHRAL DILATATION;  Surgeon: Kathi Ludwig, MD;  Location: [facility];  Service: Urology;  Laterality: N/A;  cysto, urethral dilation and possible holmium laser of stricture   . Transurethral resection of prostate  02/26/2012    Procedure: TRANSURETHRAL RESECTION OF THE PROSTATE (TURP);  Surgeon: Kathi Ludwig, MD;  Location: [facility];  Service: Urology;;  . Colonoscopy    . Polypectomy    . Ear surgeries  1980'S    BUSTED EAR DRUM  . Cataract  02-16-13,02-24-13    BILATERAL      No current facility-administered medications for this encounter.   Current Outpatient Prescriptions  Medication Sig Dispense Refill  . allopurinol (ZYLOPRIM) 100 MG tablet Take 100 mg by mouth daily at 12 noon.       Marland Kitchen aspirin EC 81 MG tablet Take 81 mg by mouth every morning.        . cetirizine (ZYRTEC) 10 MG tablet Take 10 mg by mouth daily as needed for allergies. For allergies      . clotrimazole-betamethasone (LOTRISONE) cream Apply 1 application topically as directed.       . cycloSPORINE (RESTASIS) 0.05 % ophthalmic emulsion Place 1 drop into both eyes 2 (two) times daily.      . diazepam (VALIUM) 5 MG tablet Take 5 mg by mouth every 6 (six) hours as needed. For anxiety and procedures      . gabapentin (NEURONTIN) 100 MG capsule Take 100-200 mg by mouth 3 (three) times daily. 1 tab in the morning, 1 tab at lunch, 1 tab in the evening, and 2 tabs at bedtime      . hydrochlorothiazide (HYDRODIURIL) 25 MG tablet Take 12.5 mg by mouth 2 (two) times daily. Take one-half by mouth twice a day      . HYDROcodone-acetaminophen (NORCO) 10-325 MG per tablet Take 1 tablet by mouth every 8 (eight) hours as needed for pain.      Marland Kitchen lidocaine (XYLOCAINE) 2 % jelly Apply 1 application topically 3 (three) times daily.       Marland Kitchen lisinopril (PRINIVIL,ZESTRIL) 2.5 MG tablet Take 1 tablet (2.5 mg total) by mouth daily at 12 noon.  90 tablet  3  . magnesium oxide (MAG-OX) 400 MG tablet Take 400 mg by mouth daily at 12 noon.       . metoprolol (LOPRESSOR) 50 MG tablet Take 1 tablet (50 mg total) by mouth 2 (two) times daily.  180 tablet  3  . Multiple Vitamin (MULTIVITAMIN WITH MINERALS) TABS Take 1 tablet by mouth daily.      . nitroGLYCERIN (NITROSTAT) 0.4 MG SL tablet Place 1 tablet (0.4 mg total) under the tongue every 5 (five) minutes x 3 doses as needed. For chest pain  25 tablet  3  . pantoprazole (PROTONIX) 40 MG tablet Take 1 tablet (40 mg total) by mouth daily.  30 tablet  1  . potassium chloride SA (K-DUR,KLOR-CON) 20 MEQ tablet Take 0.5 tablets (10 mEq total) by mouth 2 (two) times daily.  90 tablet  3  . ranitidine (ZANTAC) 150 MG tablet Take 150 mg by mouth at bedtime.      . RESTASIS 0.05 % ophthalmic emulsion as directed.      Marland Kitchen UNABLE TO FIND Take 1 each by mouth daily. Med  Name: ELDER BERRY LIQUID-1 TSP DAILY      . VOLTAREN 1 % GEL 2 g 4 (four) times daily.       Marland Kitchen zolpidem (AMBIEN) 10 MG tablet As directed      . [DISCONTINUED] mometasone (NASONEX) 50 MCG/ACT nasal spray 2 sprays by Nasal route as needed.           Allergies: Allergies  Allergen Reactions  . Aleve [Naproxen Sodium] Anaphylaxis and Shortness Of Breath    Reaction: Heart attack symptoms  . Statins Other (See Comments)    SEVERE MUSCLE ACHES/ JOINT PAIN     Social History:  The patient  reports that he quit smoking about 44 years ago. His smoking use included Cigarettes. He smoked 0.00 packs per day. He has never used smokeless tobacco. He reports that he does not drink alcohol or use illicit drugs.   Family History:  The patient's family history includes Heart attack in his father; Heart disease in his mother. There is no history of Colon cancer, Rectal cancer, or Stomach cancer.   ROS:  Please see the history of present illness.     All other systems reviewed and negative.   Vital Signs: Blood pressure 142/76, pulse 59  PHYSICAL EXAM: General:  Well nourished, well developed, in no acute distress HEENT: normal Neck: no JVD Endocrine:  No thryomegaly Vascular: No carotid bruits  Cardiac:  normal S1, S2; RRR; no murmur Lungs:  clear to auscultation bilaterally, no wheezing, rhonchi or rales Abd: soft, nontender, no hepatomegaly Ext: no edema Musculoskeletal:  No deformities  Skin: warm and dry Neuro:  CNs 2-12 intact, no focal abnormalities noted Psych:  Normal affect   Labs:   Lab Results  Component Value Date   WBC 8.1 08/10/2013   HGB 12.1* 08/10/2013   HCT 37.5* 08/10/2013   MCV 80.5 08/10/2013   PLT 330.0 08/10/2013     Recent Labs Lab 08/10/13 1058  NA 134*  K 4.1  CL 99  CO2 29  BUN 22  CREATININE 1.3  CALCIUM 9.8  GLUCOSE 118*      ASSESSMENT AND PLAN:   1. CAD: Patient presents for exercise treadmill test today.  This is markedly positive for  ischemia with significant ST segment depression with exercise as well as chest discomfort. ECG changes were slow to resolve in recovery. Cardiac catheterization is recommended.  Risks and benefits of cardiac catheterization have been discussed with the patient.  These include bleeding, infection, kidney damage, stroke, heart attack, death.  The patient understands these risks and is willing to proceed.   Luna Glasgow, PA-C 08/10/2013 5:19 PM    Pager # [phone]

## 2013-08-11 ENCOUNTER — Encounter: Payer: Self-pay | Admitting: Cardiology

## 2013-08-11 ENCOUNTER — Encounter (HOSPITAL_COMMUNITY)
Admission: RE | Disposition: A | Discharge status: Home / Self Care | Payer: Self-pay | Source: Ambulatory Visit | Attending: Cardiology

## 2013-08-11 ENCOUNTER — Encounter (HOSPITAL_COMMUNITY): Payer: Self-pay | Admitting: General Practice

## 2013-08-11 ENCOUNTER — Ambulatory Visit (HOSPITAL_COMMUNITY)
Admission: RE | Admit: 2013-08-11 | Discharge: 2013-08-12 | Disposition: A | Discharge status: Home / Self Care | Payer: Medicare Other | Source: Ambulatory Visit | Attending: Cardiology | Admitting: Cardiology

## 2013-08-11 DIAGNOSIS — I25810 Atherosclerosis of coronary artery bypass graft(s) without angina pectoris: Secondary | ICD-10-CM

## 2013-08-11 DIAGNOSIS — I200 Unstable angina: Secondary | ICD-10-CM | POA: Diagnosis present

## 2013-08-11 DIAGNOSIS — I10 Essential (primary) hypertension: Secondary | ICD-10-CM

## 2013-08-11 DIAGNOSIS — Z8546 Personal history of malignant neoplasm of prostate: Secondary | ICD-10-CM | POA: Insufficient documentation

## 2013-08-11 DIAGNOSIS — R339 Retention of urine, unspecified: Secondary | ICD-10-CM | POA: Insufficient documentation

## 2013-08-11 DIAGNOSIS — E7800 Pure hypercholesterolemia, unspecified: Secondary | ICD-10-CM

## 2013-08-11 DIAGNOSIS — N320 Bladder-neck obstruction: Secondary | ICD-10-CM | POA: Insufficient documentation

## 2013-08-11 DIAGNOSIS — R9439 Abnormal result of other cardiovascular function study: Secondary | ICD-10-CM

## 2013-08-11 DIAGNOSIS — I2510 Atherosclerotic heart disease of native coronary artery without angina pectoris: Secondary | ICD-10-CM | POA: Insufficient documentation

## 2013-08-11 HISTORY — DX: Pure hypercholesterolemia, unspecified: E78.00

## 2013-08-11 HISTORY — DX: Acute embolism and thrombosis of unspecified deep veins of unspecified lower extremity: I82.409

## 2013-08-11 HISTORY — DX: Gout, unspecified: M10.9

## 2013-08-11 HISTORY — DX: Unspecified osteoarthritis, unspecified site: M19.90

## 2013-08-11 HISTORY — DX: Dorsalgia, unspecified: M54.9

## 2013-08-11 HISTORY — PX: LEFT HEART CATHETERIZATION WITH CORONARY/GRAFT ANGIOGRAM: SHX5450

## 2013-08-11 HISTORY — PX: CORONARY ANGIOPLASTY: SHX604

## 2013-08-11 HISTORY — DX: Other chronic pain: G89.29

## 2013-08-11 HISTORY — DX: Angina pectoris, unspecified: I20.9

## 2013-08-11 HISTORY — DX: Other specified health status: Z78.9

## 2013-08-11 HISTORY — DX: Malignant neoplasm of prostate: C61

## 2013-08-11 HISTORY — DX: Cardiac murmur, unspecified: R01.1

## 2013-08-11 LAB — POCT ACTIVATED CLOTTING TIME: Activated Clotting Time: 375 seconds

## 2013-08-11 SURGERY — LEFT HEART CATHETERIZATION WITH CORONARY/GRAFT ANGIOGRAM
Anesthesia: Moderate Sedation | Laterality: Bilateral

## 2013-08-11 MED ORDER — SODIUM CHLORIDE 0.9 % IV SOLN: 250.0000 mL | INTRAVENOUS | Status: DC | PRN

## 2013-08-11 MED ORDER — FENTANYL CITRATE 0.05 MG/ML IJ SOLN
INTRAMUSCULAR | Status: AC
  Filled 2013-08-11: qty 2

## 2013-08-11 MED ORDER — MIDAZOLAM HCL 2 MG/2ML IJ SOLN
INTRAMUSCULAR | Status: AC
  Filled 2013-08-11: qty 2

## 2013-08-11 MED ORDER — CLOPIDOGREL BISULFATE 75 MG PO TABS
600.0000 mg | ORAL_TABLET | Freq: Once | ORAL | Status: AC
  Administered 2013-08-11: 600 mg via ORAL

## 2013-08-11 MED ORDER — MORPHINE SULFATE 2 MG/ML IJ SOLN
2.0000 mg | INTRAMUSCULAR | Status: DC | PRN
  Administered 2013-08-11: 16:00:00 4 mg via INTRAVENOUS
  Filled 2013-08-11 (×2): qty 1

## 2013-08-11 MED ORDER — SODIUM CHLORIDE 0.9 % IV SOLN
INTRAVENOUS | Status: DC
  Administered 2013-08-11: 14:00:00 via INTRAVENOUS

## 2013-08-11 MED ORDER — DIAZEPAM 5 MG PO TABS: 5.0000 mg | ORAL_TABLET | Freq: Four times a day (QID) | ORAL | Status: DC | PRN

## 2013-08-11 MED ORDER — SODIUM CHLORIDE 0.9 % IJ SOLN: 3.0000 mL | INTRAMUSCULAR | Status: DC | PRN

## 2013-08-11 MED ORDER — MAGNESIUM OXIDE 400 (241.3 MG) MG PO TABS
400.0000 mg | ORAL_TABLET | Freq: Every day | ORAL | Status: DC
  Administered 2013-08-11: 15:00:00 400 mg via ORAL
  Filled 2013-08-11 (×2): qty 1

## 2013-08-11 MED ORDER — MIDAZOLAM HCL 2 MG/2ML IJ SOLN
INTRAMUSCULAR | Status: AC
Start: 2013-08-11 — End: 2013-08-11
  Filled 2013-08-11: qty 2

## 2013-08-11 MED ORDER — NITROGLYCERIN 0.2 MG/ML ON CALL CATH LAB
INTRAVENOUS | Status: AC
  Filled 2013-08-11: qty 1

## 2013-08-11 MED ORDER — ONDANSETRON HCL 4 MG/2ML IJ SOLN
4.0000 mg | Freq: Four times a day (QID) | INTRAMUSCULAR | Status: DC | PRN
  Administered 2013-08-11: 15:00:00 4 mg via INTRAVENOUS
  Filled 2013-08-11: qty 2

## 2013-08-11 MED ORDER — HYDROCHLOROTHIAZIDE 12.5 MG PO CAPS
12.5000 mg | ORAL_CAPSULE | Freq: Two times a day (BID) | ORAL | Status: DC
  Administered 2013-08-11: 12.5 mg via ORAL
  Filled 2013-08-11 (×3): qty 1

## 2013-08-11 MED ORDER — SODIUM CHLORIDE 0.9 % IV SOLN
INTRAVENOUS | Status: DC
  Administered 2013-08-11: 08:00:00 via INTRAVENOUS

## 2013-08-11 MED ORDER — LIDOCAINE HCL (PF) 1 % IJ SOLN
INTRAMUSCULAR | Status: AC
  Filled 2013-08-11: qty 30

## 2013-08-11 MED ORDER — LIDOCAINE HCL 2 % EX GEL: 1.0000 "application " | Freq: Every day | CUTANEOUS | Status: DC | PRN

## 2013-08-11 MED ORDER — FENTANYL CITRATE 0.05 MG/ML IJ SOLN
25.0000 ug | Freq: Once | INTRAMUSCULAR | Status: AC
  Administered 2013-08-11: 25 ug via INTRAVENOUS

## 2013-08-11 MED ORDER — GABAPENTIN 100 MG PO CAPS
200.0000 mg | ORAL_CAPSULE | Freq: Every day | ORAL | Status: DC
  Administered 2013-08-11: 22:00:00 200 mg via ORAL
  Filled 2013-08-11 (×2): qty 2

## 2013-08-11 MED ORDER — HEPARIN SODIUM (PORCINE) 1000 UNIT/ML IJ SOLN
INTRAMUSCULAR | Status: AC
  Filled 2013-08-11: qty 1

## 2013-08-11 MED ORDER — HYDROCHLOROTHIAZIDE 25 MG PO TABS
12.5000 mg | ORAL_TABLET | Freq: Two times a day (BID) | ORAL | Status: DC
  Filled 2013-08-11 (×2): qty 0.5

## 2013-08-11 MED ORDER — SODIUM CHLORIDE 0.9 % IJ SOLN: 3.0000 mL | Freq: Two times a day (BID) | INTRAMUSCULAR | Status: DC

## 2013-08-11 MED ORDER — CLOPIDOGREL BISULFATE 75 MG PO TABS
75.0000 mg | ORAL_TABLET | Freq: Every day | ORAL | Status: DC
  Administered 2013-08-12: 75 mg via ORAL
  Filled 2013-08-11: qty 1

## 2013-08-11 MED ORDER — ACETAMINOPHEN 325 MG PO TABS: 650.0000 mg | ORAL_TABLET | ORAL | Status: DC | PRN

## 2013-08-11 MED ORDER — FAMOTIDINE IN NACL 20-0.9 MG/50ML-% IV SOLN
20.0000 mg | Freq: Once | INTRAVENOUS | Status: AC
  Administered 2013-08-11: 20 mg via INTRAVENOUS

## 2013-08-11 MED ORDER — SODIUM CHLORIDE 0.9 % IV SOLN
INTRAVENOUS | Status: AC
  Administered 2013-08-11 (×2): via INTRAVENOUS

## 2013-08-11 MED ORDER — FAMOTIDINE 20 MG PO TABS
20.0000 mg | ORAL_TABLET | Freq: Every day | ORAL | Status: DC
  Administered 2013-08-11: 20 mg via ORAL
  Filled 2013-08-11 (×2): qty 1

## 2013-08-11 MED ORDER — ASPIRIN 81 MG PO CHEW
81.0000 mg | CHEWABLE_TABLET | Freq: Every day | ORAL | Status: DC
  Administered 2013-08-12: 10:00:00 81 mg via ORAL
  Filled 2013-08-11: qty 1

## 2013-08-11 MED ORDER — OXYCODONE-ACETAMINOPHEN 5-325 MG PO TABS: 1.0000 | ORAL_TABLET | ORAL | Status: DC | PRN

## 2013-08-11 MED ORDER — ALLOPURINOL 100 MG PO TABS
100.0000 mg | ORAL_TABLET | Freq: Every day | ORAL | Status: DC
  Administered 2013-08-11: 18:00:00 100 mg via ORAL
  Filled 2013-08-11 (×3): qty 1

## 2013-08-11 MED ORDER — CLOPIDOGREL BISULFATE 300 MG PO TABS
ORAL_TABLET | ORAL | Status: AC
  Filled 2013-08-11: qty 2

## 2013-08-11 MED ORDER — METOPROLOL TARTRATE 50 MG PO TABS
50.0000 mg | ORAL_TABLET | Freq: Two times a day (BID) | ORAL | Status: DC
  Administered 2013-08-12: 10:00:00 50 mg via ORAL
  Filled 2013-08-11: qty 1
  Filled 2013-08-11: qty 2
  Filled 2013-08-11 (×2): qty 1

## 2013-08-11 MED ORDER — HEPARIN (PORCINE) IN NACL 2-0.9 UNIT/ML-% IJ SOLN
INTRAMUSCULAR | Status: AC
  Filled 2013-08-11: qty 1000

## 2013-08-11 MED ORDER — MAGNESIUM OXIDE 400 MG PO TABS: 400.0000 mg | ORAL_TABLET | Freq: Every day | ORAL | Status: DC

## 2013-08-11 MED ORDER — VERAPAMIL HCL 2.5 MG/ML IV SOLN
INTRAVENOUS | Status: AC
  Filled 2013-08-11: qty 2

## 2013-08-11 MED ORDER — ASPIRIN 81 MG PO CHEW
324.0000 mg | CHEWABLE_TABLET | ORAL | Status: AC
  Administered 2013-08-11: 324 mg via ORAL
  Filled 2013-08-11: qty 4

## 2013-08-11 MED ORDER — GABAPENTIN 100 MG PO CAPS
100.0000 mg | ORAL_CAPSULE | Freq: Four times a day (QID) | ORAL | Status: DC
  Administered 2013-08-11: 100 mg via ORAL

## 2013-08-11 MED ORDER — MAGNESIUM HYDROXIDE 400 MG/5ML PO SUSP
30.0000 mL | ORAL | Status: DC | PRN
  Administered 2013-08-11: 30 mL via ORAL
  Filled 2013-08-11: qty 30

## 2013-08-11 MED ORDER — CYCLOSPORINE 0.05 % OP EMUL
1.0000 [drp] | Freq: Two times a day (BID) | OPHTHALMIC | Status: DC
  Administered 2013-08-11: 1 [drp] via OPHTHALMIC
  Filled 2013-08-11 (×3): qty 1

## 2013-08-11 MED ORDER — BIVALIRUDIN 250 MG IV SOLR
INTRAVENOUS | Status: AC
  Filled 2013-08-11: qty 250

## 2013-08-11 MED ORDER — LISINOPRIL 2.5 MG PO TABS
2.5000 mg | ORAL_TABLET | Freq: Every day | ORAL | Status: DC
  Administered 2013-08-11: 2.5 mg via ORAL
  Filled 2013-08-11 (×3): qty 1

## 2013-08-11 MED ORDER — FAMOTIDINE IN NACL 20-0.9 MG/50ML-% IV SOLN
INTRAVENOUS | Status: AC
  Filled 2013-08-11: qty 50

## 2013-08-11 MED ORDER — GABAPENTIN 100 MG PO CAPS
100.0000 mg | ORAL_CAPSULE | Freq: Three times a day (TID) | ORAL | Status: DC
  Administered 2013-08-11: 100 mg via ORAL
  Filled 2013-08-11 (×5): qty 1

## 2013-08-11 MED ORDER — HYDROCODONE-ACETAMINOPHEN 10-325 MG PO TABS
1.0000 | ORAL_TABLET | ORAL | Status: DC | PRN
  Administered 2013-08-11 – 2013-08-12 (×3): 1 via ORAL
  Filled 2013-08-11 (×3): qty 1

## 2013-08-11 NOTE — Progress Notes (Signed)
08/11/13 2132  Vitals  BP ! 104/38 mmHg  MAP (mmHg) 58  ECG Heart Rate 65  Pain Assessment  Pain Assessment No/denies pain   BP 104/38. Pt states "my blood pressure never runs that low."  Tele shows SB 58-65, due for 50 mg metoprolol, med held at this time, will continue to monitor.

## 2013-08-11 NOTE — Interval H&P Note (Signed)
Cath Lab Visit (complete for each Cath Lab visit)  Clinical Evaluation Leading to the Procedure:   ACS: no  Non-ACS:    Anginal Classification: CCS III  Anti-ischemic medical therapy: Maximal Therapy (2 or more classes of medications)  Non-Invasive Test Results: Intermediate-risk stress test findings: cardiac mortality 1-3%/year  Prior CABG: Previous CABG      History and Physical Interval Note:  08/11/2013 9:17 AM  Para Skeans  has presented today for surgery, with the diagnosis of cad  The various methods of treatment have been discussed with the patient and family. After consideration of risks, benefits and other options for treatment, the patient has consented to  Procedure(s): LEFT HEART CATHETERIZATION WITH CORONARY/GRAFT ANGIOGRAM (Bilateral) as a surgical intervention .  The patient's history has been reviewed, patient examined, no change in status, stable for surgery.  I have reviewed the patient's chart and labs.  Questions were answered to the patient's satisfaction.     [name]

## 2013-08-11 NOTE — CV Procedure (Signed)
   Cardiac Catheterization Operative Report  FATE CASTER [id number] 9/18/20141:47 PM [name], MD  Procedure Performed:  1. PTCA/DES x 3 SVG to PDA    Operator: Verne Carrow, MD  Indication: 72 yo male with CAD with prior CABG. Recent stress treadmill with ischemia. Dr. Antoine Poche performed his diagnostic cath this am. The patient was found to have sub-acute occlusion of the SVG to the PDA. I was asked to perform the PCI.                                        Procedure Details: The risks, benefits, complications, treatment options, and expected outcomes were discussed with the patient. The patient and/or family concurred with the proposed plan, giving informed consent. The patient was brought to the cath lab from the holding area with a 5 French sheath in place in the right femoral artery. The patient was further sedated with Versed and Fentanyl. The right groin was prepped and draped in the usual manner. The sheath was exchanged for a 6 French sheath. He was given a weight based bolus of Angiomax and a drip was started. He had been loaded with 600 mg Plavix po x 1 earlier today. When the ACT was greater than 200, I passed a BMW wire down the body of the SVG. I could not pass this wire distally into the target vessel. A 2.5 x 12 mm balloon was used to pre-dilate the distal body of the SVG but flow was not re-established. I then passed a Whisper wire down the SVG into the target PDA. I then used a 2.0 x 20 mm Empira balloon x 4 to pre-dilate the distal body of the SVG. Flow was re-established. I then deployed a 2.25 x 32 mm Promus Premier DES in the distal body of the SVG. I then deployed a 2. 5 x 32 mm Promus Premier DES in the mid body of the SVG in an overlapping fashion. Another 2.5 x 32 mm Promus Premier DES was deployed in the proximal body of the SVG in an overlapping fashion. The entire stented segment of the body of the SVG was post-dilated with a 2. 5 x 20 mm [place] balloon x 5  inflations. There was an excellent result with excellent flow into the distal target vessel.   There were no immediate complications. The patient was taken to the recovery area in stable condition.   Hemodynamic Findings: Central aortic pressure: 132/59  Impression: 1. Unstable angina secondary to occlusion of SVG to PDA 2. Successful PTCA/DES x 3 proximal, mid and distal body of SVG to PDA  Recommendations: He will need lifelong dual anti-platelet therapy with ASA and Plavix given the long segment of stent used. Continue other cardiac meds.        Complications:  None; patient tolerated the procedure well.

## 2013-08-11 NOTE — CV Procedure (Signed)
   Cardiac Catheterization Procedure Note  Name: Caleb Berry MRN: [id number] DOB: [birth year]  Procedure: Left Heart Cath, Selective Coronary Angiography, LV angiography  Indication:    Class III symptoms (decreased exercise tolerance), CABG, abnormal ETT  Procedural details: The right groin was prepped, draped, and anesthetized with 1% lidocaine. Using modified Seldinger technique, a 5 French sheath was introduced into the right femoral artery. Standard Judkins catheters were used for coronary angiography and left ventriculography. Catheter exchanges were performed over a guidewire. There were no immediate procedural complications. The patient was transferred to the post catheterization recovery area for further monitoring.  Procedural Findings:   Hemodynamics:     AO 131/9    LV 125/65   Coronary angiography:   Coronary dominance: Right  Left mainstem:   Normal.   Left anterior descending (LAD):   Severe proximal and mid disease in a previously stented area with mid occlusion.  The distal vessel fills via LIMA.  The distal and apical vessel has moderate diffuse disease as it wraps the apex.  There is a diagonal that is occluded at the ostium and fills via the SVG.  Left circumflex (LCx):  Proximal stent widely patent with mild luminal irregularities.  Distally the circ ends as a large MOM with diffuse moderate nonobstructive plaque and a patent stent.  RI is moderate sized with proximal occlusion and fills via the SVG   Right coronary artery (RCA):  The RCA is a large vessel with severe diffuse obstructive plaque proximal to the PDA.  The PDA is a moderate sized vessel and is free of high grade disease.    Grafts:     LIMA - LAD:  Patent.     SVG - PDA:  Mid 95% with TIMI I flow.   SVG - RI (high OM) - The graft is widely patent.  The native vessel is moderate sized without high grade stenosis.     SVG - Diagonal.  Patent.  The native vessel is small.  The graft has a distal  50% stenosis.   Left ventriculography: Left ventricular systolic function is normal, LVEF is estimated at 60%, there is no significant mitral regurgitation   Final Conclusions:  Severe native vessel CAD with high grade stenosis in the SVG to the PDA.  This is new compared with the 2012 cath.    Recommendations: I will discuss with Dr. Clifton Chinedu Agustin possible PCI of the SVG.    [name] 08/11/2013, 9:59 AM

## 2013-08-11 NOTE — Progress Notes (Signed)
Site area: right groin  Site Prior to Removal:  Level 0  Pressure Applied For 20 MINUTES    Minutes Beginning at 1620  Manual:   yes  Patient Status During Pull:  stable  Post Pull Groin Site:  Level 0  Post Pull Instructions Given:  yes  Post Pull Pulses Present:  yes  Dressing Applied:  yes  Comments:

## 2013-08-12 DIAGNOSIS — I25810 Atherosclerosis of coronary artery bypass graft(s) without angina pectoris: Secondary | ICD-10-CM

## 2013-08-12 DIAGNOSIS — I200 Unstable angina: Secondary | ICD-10-CM | POA: Diagnosis present

## 2013-08-12 LAB — BASIC METABOLIC PANEL
BUN: 17 mg/dL (ref 6–23)
Calcium: 9.5 mg/dL (ref 8.4–10.5)
GFR calc Af Amer: 63 mL/min — ABNORMAL LOW (ref >90)
GFR calc non Af Amer: 55 mL/min — ABNORMAL LOW (ref >90)
Potassium: 4.7 mEq/L (ref 3.5–5.1)
Sodium: 137 mEq/L (ref 135–145)

## 2013-08-12 LAB — CBC
HCT: 36.5 % — ABNORMAL LOW (ref 39.0–52.0)
MCHC: 31.2 g/dL (ref 30.0–36.0)
RDW: 15.2 % (ref 11.5–15.5)

## 2013-08-12 MED ORDER — CLOPIDOGREL BISULFATE 75 MG PO TABS: 75.0000 mg | ORAL_TABLET | Freq: Every day | ORAL | Status: AC

## 2013-08-12 MED FILL — Sodium Chloride IV Soln 0.9%: INTRAVENOUS | Qty: 50 | Status: AC

## 2013-08-12 NOTE — Discharge Summary (Signed)
CARDIOLOGY DISCHARGE SUMMARY   Patient ID: Caleb Berry MRN: [id number] DOB/AGE: [birth year] 72 y.o.  Admit date: 08/11/2013 Discharge date: 08/12/2013  Primary Discharge Diagnosis:     Unstable angina - s/p DES x 3 to SVG-PDA Secondary Discharge Diagnosis:   Procedures: Left Heart Cath, Selective Coronary Angiography, LV angiography, PTCA/DES x 3 SVG to PDA   Hospital Course: Caleb Berry is a 72 y.o. male with a history of CAD. He had a routine GXT in the office and developed chest pain, ST depression and SOB during the GXT. The procedure was aborted. The ECGs were reviewed and cardiac catheterization was recommended. He was admitted to [facility] and taken to the cath lab.   Cardiac catheterization results are below. He had 3 DES to the SVG-PDA. He tolerated the procedure well. His symptoms resolved. He controls his CRFs and will resume his exercise program as an outpatient.   On 9/19, Caleb Berry was seen by Dr. Excell Seltzer. His cath site was without hematoma or ecchymosis. He was ambulating without chest pain or SOB and considered stable for discharge, to follow up as an outpatient.  Labs:   Lab Results  Component Value Date   WBC 5.8 08/12/2013   HGB 11.4* 08/12/2013   HCT 36.5* 08/12/2013   MCV 81.5 08/12/2013   PLT 254 08/12/2013     Recent Labs Lab 08/12/13 0612  NA 137  K 4.7  CL 98  CO2 28  BUN 17  CREATININE 1.27  CALCIUM 9.5  GLUCOSE 111*    Recent Labs  08/10/13 1058  INR 1.0      Radiology: No results found.  Cardiac Cath: 08/11/2013 Left mainstem: Normal.  Left anterior descending (LAD): Severe proximal and mid disease in a previously stented area with mid occlusion. The distal vessel fills via LIMA. The distal and apical vessel has moderate diffuse disease as it wraps the apex. There is a diagonal that is occluded at the ostium and fills via the SVG.  Left circumflex (LCx): Proximal stent widely patent with mild luminal irregularities. Distally the circ ends  as a large MOM with diffuse moderate nonobstructive plaque and a patent stent. RI is moderate sized with proximal occlusion and fills via the SVG  Right coronary artery (RCA): The RCA is a large vessel with severe diffuse obstructive plaque proximal to the PDA. The PDA is a moderate sized vessel and is free of high grade disease.  Grafts:  LIMA - LAD: Patent.  SVG - PDA: Mid 95% with TIMI I flow.  SVG - RI (high OM) - The graft is widely patent. The native vessel is moderate sized without high grade stenosis.  SVG - Diagonal. Patent. The native vessel is small. The graft has a distal 50% stenosis.  Left ventriculography: Left ventricular systolic function is normal, LVEF is estimated at 60%, there is no significant mitral regurgitation  Final Conclusions: Severe native vessel CAD with high grade stenosis in the SVG to the PDA. This is new compared with the 2012 cath Impression:  1. Unstable angina secondary to occlusion of SVG to PDA  2. Successful PTCA/DES x 3 proximal, mid and distal body of SVG to PDA. 2.5 x 32 mm Promus Premier DES was deployed in the proximal body of the SVG-PDA  2. 5 x 32 mm Promus Premier DES in the mid body of the SVG in an overlapping fashion 2.25 x 32 mm Promus Premier DES in the distal body of the SVG Recommendations: He will need  lifelong dual anti-platelet therapy with ASA and Plavix given the long segment of stent used.   EKG:  SR, ST during GXT, dynamic ECG changes  FOLLOW UP PLANS AND APPOINTMENTS Allergies  Allergen Reactions  . Aleve [Naproxen Sodium] Other (See Comments)    Reaction: Heart attack symptoms  . Statins Other (See Comments)    SEVERE MUSCLE ACHES/ JOINT PAIN "arthritis symptoms"      Medication List         allopurinol 100 MG tablet  Commonly known as:  ZYLOPRIM  Take 100 mg by mouth daily at 12 noon.     aspirin EC 81 MG tablet  Take 81 mg by mouth every morning.     ASTEPRO 0.15 % Soln  Generic drug:  Azelastine HCl  Place 2  sprays into the nose daily as needed. For nasal congestion/allergy symptoms     cetirizine 10 MG tablet  Commonly known as:  ZYRTEC  Take 10 mg by mouth daily as needed for allergies. For allergies     clopidogrel 75 MG tablet  Commonly known as:  PLAVIX  Take 1 tablet (75 mg total) by mouth daily with breakfast.     clotrimazole-betamethasone cream  Commonly known as:  LOTRISONE  Apply 1 application topically daily as needed.     cycloSPORINE 0.05 % ophthalmic emulsion  Commonly known as:  RESTASIS  Place 1 drop into both eyes 2 (two) times daily.     diazepam 5 MG tablet  Commonly known as:  VALIUM  Take 5 mg by mouth every 6 (six) hours as needed. For anxiety 1 hour before  procedures     gabapentin 100 MG capsule  Commonly known as:  NEURONTIN  Take 100-200 mg by mouth 4 (four) times daily. 1 tab in the morning, 1 tab at lunch, 1 tab in the evening, and 2 tabs at bedtime     hydrochlorothiazide 25 MG tablet  Commonly known as:  HYDRODIURIL  Take 12.5 mg by mouth 2 (two) times daily.     HYDROcodone-acetaminophen 10-325 MG per tablet  Commonly known as:  NORCO  Take 1 tablet by mouth every 8 (eight) hours as needed for pain. For pain     lidocaine 2 % jelly  Commonly known as:  XYLOCAINE  Apply 1 application topically daily as needed. For catheter insertion     lisinopril 2.5 MG tablet  Commonly known as:  PRINIVIL,ZESTRIL  Take 1 tablet (2.5 mg total) by mouth daily at 12 noon.     magnesium oxide 400 MG tablet  Commonly known as:  MAG-OX  Take 400 mg by mouth daily at 12 noon.     metoprolol 50 MG tablet  Commonly known as:  LOPRESSOR  Take 1 tablet (50 mg total) by mouth 2 (two) times daily.     nitroGLYCERIN 0.4 MG SL tablet  Commonly known as:  NITROSTAT  Place 1 tablet (0.4 mg total) under the tongue every 5 (five) minutes x 3 doses as needed. For chest pain     OVER THE COUNTER MEDICATION  Take 5 mLs by mouth daily. Med Name: ELDER BERRY LIQUID-1 TSP  DAILY     pantoprazole 40 MG tablet  Commonly known as:  PROTONIX  Take 1 tablet (40 mg total) by mouth daily.     potassium chloride SA 20 MEQ tablet  Commonly known as:  K-DUR,KLOR-CON  Take 0.5 tablets (10 mEq total) by mouth 2 (two) times daily.     ranitidine  150 MG tablet  Commonly known as:  ZANTAC  Take 150 mg by mouth at bedtime.     VOLTAREN 1 % Gel  Generic drug:  diclofenac sodium  Apply 2 g topically 4 (four) times daily as needed. For shoulder pain/inflammation     zolpidem 10 MG tablet  Commonly known as:  AMBIEN  Take 10 mg by mouth at bedtime as needed. For sleep        Discharge Orders   Future Appointments Provider Department Dept Phone   08/29/2013 11:30 AM Beatrice Lecher, PA-C [facility] Main Office [place]) [phone]   Future Orders Complete By Expires   Diet - low sodium heart healthy  As directed    Increase activity slowly  As directed      Follow-up Information   Follow up with Tereso Newcomer, PA-C On 08/29/2013. (at 11:30 am)    Specialty:  Physician Assistant   Contact information:   [address] [place] [address] [phone]       BRING ALL MEDICATIONS WITH YOU TO FOLLOW UP APPOINTMENTS  Time spent with patient to include physician time: 31 min Signed: Theodore Demark, PA-C 08/12/2013, 11:36 AM Co-Sign MD

## 2013-08-12 NOTE — Progress Notes (Signed)
CARDIAC REHAB PHASE I   PRE:  Rate/Rhythm: 88SR  BP:  Supine:   Sitting: 124/72  Standing:    SaO2:   MODE:  Ambulation: 800 ft   POST:  Rate/Rhythm: 87SR  BP:  Supine:   Sitting: 148/80  Standing:    SaO2:  0755-0850 Pt walked 800 ft with steady gait. Tolerated well. No CP. Education completed with pt and wife. Understanding voiced. Discussed CRP 2. Pt has attended before and does not want to attend again. Encouraged pt to try to get HGA1C back down as he states he got it to 5.5 with diet and now it is close to 7.    Luetta Nutting, RN BSN  08/12/2013 8:45 AM

## 2013-08-12 NOTE — Progress Notes (Signed)
    Subjective:  Feels well. No chest pain or dyspnea.   Objective:  Vital Signs in the last 24 hours: Temp:  [97.6 F (36.4 C)-98.3 F (36.8 C)] 97.7 F (36.5 C) (09/19 0748) Pulse Rate:  [7-100] 71 (09/19 0748) Resp:  [16-20] 18 (09/19 0748) BP: (99-164)/(38-94) 124/72 mmHg (09/19 0748) SpO2:  [90 %-99 %] 98 % (09/19 0748) Weight:  [174 lb 6.1 oz (79.1 kg)] 174 lb 6.1 oz (79.1 kg) (09/19 0034)  Intake/Output from previous day: 09/18 0701 - 09/19 0700 In: 1270 [P.O.:720; I.V.:500; IV Piggyback:50] Out: 2100 [Urine:2100]  Physical Exam: Pt is alert and oriented, NAD HEENT: normal Neck: JVP - normal Lungs: CTA bilaterally CV: RRR without murmur or gallop Abd: soft, NT, Positive BS, no hepatomegaly Ext: no C/C/E, distal pulses intact and equal, right groin clear Skin: warm/dry no rash  Lab Results:  Recent Labs  08/10/13 1058 08/12/13 0612  WBC 8.1 5.8  HGB 12.1* 11.4*  PLT 330.0 254    Recent Labs  08/10/13 1058 08/12/13 0612  NA 134* 137  K 4.1 4.7  CL 99 98  CO2 29 28  GLUCOSE 118* 111*  BUN 22 17  CREATININE 1.3 1.27   No results found for this basename: TROPONINI, CK, MB,  in the last 72 hours  Cardiac Studies: none  Tele: Sinus rhythm, personally reviewed  Assessment/Plan:  CAD s/p CABG with successful PCI (overlapping DES). Long-term DAPT with ASA and plavix recommended. Meds reviewed and appropriate. He is unable to take statin drugs (has tried multiple agents). Follow-up with Dr Antoine Poche as outpatient.  Tonny Bollman, M.D. 08/12/2013, 10:27 AM

## 2013-08-17 ENCOUNTER — Emergency Department (HOSPITAL_COMMUNITY)
Admission: EM | Admit: 2013-08-17 | Discharge: 2013-08-17 | Disposition: A | Discharge status: Home / Self Care | Payer: Medicare Other | Attending: Emergency Medicine | Admitting: Emergency Medicine

## 2013-08-17 ENCOUNTER — Encounter (HOSPITAL_COMMUNITY): Payer: Self-pay | Admitting: *Deleted

## 2013-08-17 ENCOUNTER — Other Ambulatory Visit: Payer: Self-pay | Admitting: *Deleted

## 2013-08-17 ENCOUNTER — Emergency Department (HOSPITAL_COMMUNITY): Payer: Medicare Other

## 2013-08-17 DIAGNOSIS — R1310 Dysphagia, unspecified: Secondary | ICD-10-CM

## 2013-08-17 DIAGNOSIS — Z79899 Other long term (current) drug therapy: Secondary | ICD-10-CM | POA: Insufficient documentation

## 2013-08-17 DIAGNOSIS — R6883 Chills (without fever): Secondary | ICD-10-CM

## 2013-08-17 DIAGNOSIS — N390 Urinary tract infection, site not specified: Secondary | ICD-10-CM | POA: Insufficient documentation

## 2013-08-17 DIAGNOSIS — Z7902 Long term (current) use of antithrombotics/antiplatelets: Secondary | ICD-10-CM | POA: Insufficient documentation

## 2013-08-17 DIAGNOSIS — M129 Arthropathy, unspecified: Secondary | ICD-10-CM | POA: Insufficient documentation

## 2013-08-17 DIAGNOSIS — J3489 Other specified disorders of nose and nasal sinuses: Secondary | ICD-10-CM | POA: Insufficient documentation

## 2013-08-17 DIAGNOSIS — I252 Old myocardial infarction: Secondary | ICD-10-CM | POA: Insufficient documentation

## 2013-08-17 DIAGNOSIS — Z87442 Personal history of urinary calculi: Secondary | ICD-10-CM | POA: Insufficient documentation

## 2013-08-17 DIAGNOSIS — M109 Gout, unspecified: Secondary | ICD-10-CM | POA: Insufficient documentation

## 2013-08-17 DIAGNOSIS — R011 Cardiac murmur, unspecified: Secondary | ICD-10-CM | POA: Insufficient documentation

## 2013-08-17 DIAGNOSIS — K219 Gastro-esophageal reflux disease without esophagitis: Secondary | ICD-10-CM | POA: Insufficient documentation

## 2013-08-17 DIAGNOSIS — Z86718 Personal history of other venous thrombosis and embolism: Secondary | ICD-10-CM | POA: Insufficient documentation

## 2013-08-17 DIAGNOSIS — Z8546 Personal history of malignant neoplasm of prostate: Secondary | ICD-10-CM | POA: Insufficient documentation

## 2013-08-17 DIAGNOSIS — Z87891 Personal history of nicotine dependence: Secondary | ICD-10-CM | POA: Insufficient documentation

## 2013-08-17 DIAGNOSIS — Z951 Presence of aortocoronary bypass graft: Secondary | ICD-10-CM | POA: Insufficient documentation

## 2013-08-17 DIAGNOSIS — I2510 Atherosclerotic heart disease of native coronary artery without angina pectoris: Secondary | ICD-10-CM | POA: Insufficient documentation

## 2013-08-17 DIAGNOSIS — Z9861 Coronary angioplasty status: Secondary | ICD-10-CM | POA: Insufficient documentation

## 2013-08-17 DIAGNOSIS — I10 Essential (primary) hypertension: Secondary | ICD-10-CM | POA: Insufficient documentation

## 2013-08-17 DIAGNOSIS — Z862 Personal history of diseases of the blood and blood-forming organs and certain disorders involving the immune mechanism: Secondary | ICD-10-CM | POA: Insufficient documentation

## 2013-08-17 DIAGNOSIS — R509 Fever, unspecified: Secondary | ICD-10-CM | POA: Insufficient documentation

## 2013-08-17 DIAGNOSIS — Z1211 Encounter for screening for malignant neoplasm of colon: Secondary | ICD-10-CM

## 2013-08-17 DIAGNOSIS — Z8669 Personal history of other diseases of the nervous system and sense organs: Secondary | ICD-10-CM | POA: Insufficient documentation

## 2013-08-17 DIAGNOSIS — G8929 Other chronic pain: Secondary | ICD-10-CM | POA: Insufficient documentation

## 2013-08-17 LAB — URINALYSIS, ROUTINE W REFLEX MICROSCOPIC
Bilirubin Urine: NEGATIVE
Glucose, UA: NEGATIVE mg/dL
Ketones, ur: NEGATIVE mg/dL
Protein, ur: 30 mg/dL — AB
pH: 7 (ref 5.0–8.0)

## 2013-08-17 LAB — COMPREHENSIVE METABOLIC PANEL
ALT: 18 U/L (ref 0–53)
AST: 16 U/L (ref 0–37)
Albumin: 3.9 g/dL (ref 3.5–5.2)
Alkaline Phosphatase: 71 U/L (ref 39–117)
BUN: 22 mg/dL (ref 6–23)
Chloride: 96 mEq/L (ref 96–112)
Potassium: 3.9 mEq/L (ref 3.5–5.1)
Sodium: 134 mEq/L — ABNORMAL LOW (ref 135–145)
Total Bilirubin: 0.3 mg/dL (ref 0.3–1.2)
Total Protein: 7.5 g/dL (ref 6.0–8.3)

## 2013-08-17 LAB — URINE MICROSCOPIC-ADD ON

## 2013-08-17 LAB — CBC WITH DIFFERENTIAL/PLATELET
Basophils Absolute: 0.1 10*3/uL (ref 0.0–0.1)
Basophils Relative: 1 % (ref 0–1)
Hemoglobin: 12.7 g/dL — ABNORMAL LOW (ref 13.0–17.0)
MCHC: 32.3 g/dL (ref 30.0–36.0)
Monocytes Relative: 7 % (ref 3–12)
Neutro Abs: 4.6 10*3/uL (ref 1.7–7.7)
Neutrophils Relative %: 71 % (ref 43–77)
Platelets: 239 10*3/uL (ref 150–400)

## 2013-08-17 LAB — CG4 I-STAT (LACTIC ACID): Lactic Acid, Venous: 1.07 mmol/L (ref 0.5–2.2)

## 2013-08-17 MED ORDER — SODIUM CHLORIDE 0.9 % IV BOLUS (SEPSIS)
500.0000 mL | Freq: Once | INTRAVENOUS | Status: AC
  Administered 2013-08-17: 500 mL via INTRAVENOUS

## 2013-08-17 MED ORDER — ONDANSETRON HCL 4 MG/2ML IJ SOLN
4.0000 mg | Freq: Once | INTRAMUSCULAR | Status: AC
  Administered 2013-08-17: 4 mg via INTRAVENOUS
  Filled 2013-08-17: qty 2

## 2013-08-17 MED ORDER — MORPHINE SULFATE 4 MG/ML IJ SOLN
4.0000 mg | Freq: Once | INTRAMUSCULAR | Status: AC
  Administered 2013-08-17: 4 mg via INTRAVENOUS
  Filled 2013-08-17: qty 1

## 2013-08-17 MED ORDER — CIPROFLOXACIN HCL 500 MG PO TABS: 500.0000 mg | ORAL_TABLET | Freq: Two times a day (BID) | ORAL | Status: AC

## 2013-08-17 MED ORDER — PANTOPRAZOLE SODIUM 40 MG PO TBEC: 40.0000 mg | DELAYED_RELEASE_TABLET | Freq: Every day | ORAL | Status: AC

## 2013-08-17 NOTE — ED Provider Notes (Signed)
CSN: [id number]     Arrival date & time 08/17/13  2003 History   First MD Initiated Contact with Patient 08/17/13 2125     Chief Complaint  Patient presents with  . Shortness of Breath   (Consider location/radiation/quality/duration/timing/severity/associated sxs/prior Treatment) HPI 72 year old male with extensive past medical history as detailed below that presents with complaints of fevers, chills and congestion. The symptoms began yesterday when he developed congestion after mowing the grass.  He began having chills this evening so he checked his temperature and noticed it was 101. He did take 1 g of Tylenol prior to coming to the emergency department. Patient is concerned that he may have gotten "bacteria in my blood stream" after he had a catheterization with some bleeding afterwards. On ROS, he endorses a mild nonproductive cough.    Of note he had 3 cardiac stents placed in one of his bypass grafts this past Thursday. He has noted no pain swelling or redness around the femoral insertion site. No travel or sick contacts. He denies nausea, vomiting, diarrhea, abdominal pain, chest pain, neck pain, headache. He states that he is here "because the wife made me come".   Past Medical History  Diagnosis Date  . HTN (hypertension)   . Stricture of urethra SELF-CATH AS NEEDED    S/P MULTIPLE DILATION'S   . Bladder neck contracture     POST TUI AND UROLUM STENTS  . Status post carotid endarterectomy LEFT  . Status post primary angioplasty with coronary stent 2006-- [place]    X2 DRUG-ELUTING STENTS  . CAD (coronary artery disease) CARDIOLOGIST- DR Riley Kill- VISIT NOTE 09-08-11 IN EPIC    LAST STRESS TEST 2006 ABNORMAL  . GERD (gastroesophageal reflux disease)     CONTROLLED W/ PRILOSEC AND ZANTAC  . Impaired hearing BILATERAL AIDS  . Insomnia   . S/P coronary artery bypass graft x 3   . Urinary retention   . Esophageal disorder     narrowing  . Clotting disorder   . Peripheral neuropathy  FEET  . Hiatal hernia   . Nephrolithiasis   . Kidney stones   . High cholesterol   . Heart murmur 1960  . Anginal pain 1989  . Myocardial infarction 2006    S/P PTCA W/ X2 DE STENTS  . DVT (deep venous thrombosis) 07/2012    riht IJ/notes 08/20/2012 (08/11/2013)  . DDD (degenerative disc disease)   . Arthritis     "all down my back; in my hands" (08/11/2013)  . Chronic back pain     "q vertebra in my back; L1 & L2 major" (08/10/2013)  . Gout 1971    "once" (08/11/2013)  . Prostate cancer     S/P RADICAL PROSTATECTOMY  . Self-catheterizes urinary bladder     "qd" (08/11/2013)   Past Surgical History  Procedure Laterality Date  . Cysto w/ balloon dilation urethral stricture  05-19-2011  . Transurethral vaporization bladder neck contracture / dilatation urethral stricture  12-26-2010  . Prostatectomy  1996  . Cystoscopy w/ internal urethrotomy  2003      X2--   W/ UROLUME PROSTHESIS  . Percutaneous nephrotomy  1989  . Extracorporeal shock wave lithotripsy  X5  . Urethral dilatation's  MULTIPLE   . Carotid endarterectomy Left 2002  . Cystoscopy with urethral dilatation  11/27/2011    Procedure: CYSTOSCOPY WITH URETHRAL DILATATION;  Surgeon: Kathi Ludwig, MD;  Location: [facility];  Service: Urology;  Laterality: N/A;  ROOM # 2 REQUESTED   .  Cystoscopy with urethral dilatation  02/26/2012    Procedure: CYSTOSCOPY WITH URETHRAL DILATATION;  Surgeon: Kathi Ludwig, MD;  Location: [facility];  Service: Urology;  Laterality: N/A;  cysto, urethral dilation and possible holmium laser of stricture   . Transurethral resection of prostate  02/26/2012    Procedure: TRANSURETHRAL RESECTION OF THE PROSTATE (TURP);  Surgeon: Kathi Ludwig, MD;  Location: [facility];  Service: Urology;;  . Colonoscopy    . Polypectomy    . Tympanoplasty  1980'S    "patched eardrum" (08/11/2013)  . Cataract extraction w/ intraocular lens  implant,  bilateral  02-16-13-02-24-13  . Coronary artery bypass graft  2000    "CABG X 5" (08/11/2013)  . Coronary angioplasty with stent placement  2004; 2006; 08/11/2013    "1 + 1 + 3" (08/11/2013)  . Cardiac catheterization  12-18-2010 AND 2008  . Coronary angioplasty  08/11/2013   Family History  Problem Relation Age of Onset  . Heart disease Mother   . Heart attack Father   . Colon cancer Neg Hx   . Rectal cancer Neg Hx   . Stomach cancer Neg Hx    History  Substance Use Topics  . Smoking status: Former Smoker -- 0.75 packs/day for 15 years    Types: Cigarettes    Quit date: 11/24/1968  . Smokeless tobacco: Never Used  . Alcohol Use: No    Review of Systems  Constitutional: Negative for fever and chills.  HENT: Negative for congestion, sore throat, neck pain and neck stiffness.   Respiratory: Negative for chest tightness and shortness of breath.   Cardiovascular: Negative for chest pain, palpitations and leg swelling.  Gastrointestinal: Negative for nausea, vomiting, abdominal pain and diarrhea.  Genitourinary: Negative for dysuria and frequency.  Musculoskeletal: Negative for myalgias, back pain and joint swelling.  Skin: Negative for rash.  Neurological: Negative for headaches.  All other systems reviewed and are negative.    Allergies  Aleve and Statins  Home Medications   Current Outpatient Rx  Name  Route  Sig  Dispense  Refill  . allopurinol (ZYLOPRIM) 100 MG tablet   Oral   Take 100 mg by mouth daily at 12 noon.          Marland Kitchen aspirin EC 81 MG tablet   Oral   Take 81 mg by mouth every morning.         . Azelastine HCl (ASTEPRO) 0.15 % SOLN   Nasal   Place 2 sprays into the nose daily as needed. For nasal congestion/allergy symptoms         . cetirizine (ZYRTEC) 10 MG tablet   Oral   Take 10 mg by mouth daily as needed for allergies. For allergies         . clopidogrel (PLAVIX) 75 MG tablet   Oral   Take 1 tablet (75 mg total) by mouth daily with  breakfast.   30 tablet   11   . clotrimazole-betamethasone (LOTRISONE) cream   Topical   Apply 1 application topically daily as needed.          . cycloSPORINE (RESTASIS) 0.05 % ophthalmic emulsion   Both Eyes   Place 1 drop into both eyes 2 (two) times daily.         . diazepam (VALIUM) 5 MG tablet   Oral   Take 5 mg by mouth every 6 (six) hours as needed. For anxiety 1 hour before  procedures         .  gabapentin (NEURONTIN) 100 MG capsule   Oral   Take 100-200 mg by mouth 4 (four) times daily. 1 tab in the morning, 1 tab at lunch, 1 tab in the evening, and 2 tabs at bedtime         . hydrochlorothiazide (HYDRODIURIL) 25 MG tablet   Oral   Take 12.5 mg by mouth 2 (two) times daily.          Marland Kitchen HYDROcodone-acetaminophen (NORCO) 10-325 MG per tablet   Oral   Take 1 tablet by mouth every 8 (eight) hours as needed for pain. For pain         . lidocaine (XYLOCAINE) 2 % jelly   Topical   Apply 1 application topically daily as needed. For catheter insertion         . lisinopril (PRINIVIL,ZESTRIL) 2.5 MG tablet   Oral   Take 1 tablet (2.5 mg total) by mouth daily at 12 noon.   90 tablet   3   . magnesium oxide (MAG-OX) 400 MG tablet   Oral   Take 400 mg by mouth daily at 12 noon.          . metoprolol (LOPRESSOR) 50 MG tablet   Oral   Take 1 tablet (50 mg total) by mouth 2 (two) times daily.   180 tablet   3   . nitroGLYCERIN (NITROSTAT) 0.4 MG SL tablet   Sublingual   Place 1 tablet (0.4 mg total) under the tongue every 5 (five) minutes x 3 doses as needed. For chest pain   25 tablet   3   . OVER THE COUNTER MEDICATION   Oral   Take 5 mLs by mouth daily. Med Name: ELDER BERRY LIQUID-1 TSP DAILY         . pantoprazole (PROTONIX) 40 MG tablet   Oral   Take 1 tablet (40 mg total) by mouth daily.   30 tablet   11   . potassium chloride SA (K-DUR,KLOR-CON) 20 MEQ tablet   Oral   Take 0.5 tablets (10 mEq total) by mouth 2 (two) times daily.    90 tablet   3   . ranitidine (ZANTAC) 150 MG tablet   Oral   Take 150 mg by mouth at bedtime.         . VOLTAREN 1 % GEL   Topical   Apply 2 g topically 4 (four) times daily as needed. For shoulder pain/inflammation         . zolpidem (AMBIEN) 10 MG tablet   Oral   Take 10 mg by mouth at bedtime as needed. For sleep          BP 120/90  Pulse 113  Temp(Src) 99.8 F (37.7 C) (Oral)  Resp 20  SpO2 96% Physical Exam  Vitals reviewed. Constitutional: He is oriented to person, place, and time. He appears well-developed and well-nourished. No distress.  HENT:  Head: Normocephalic.  Right Ear: External ear normal.  Left Ear: External ear normal.  Nose: Nose normal.  Mouth/Throat: Oropharynx is clear and moist. No oropharyngeal exudate.  Eyes: Conjunctivae and EOM are normal. Pupils are equal, round, and reactive to light.  Neck: Normal range of motion. Neck supple.  Cardiovascular: Normal rate, regular rhythm, normal heart sounds and intact distal pulses.  Exam reveals no gallop and no friction rub.   No murmur heard. Pulmonary/Chest: Effort normal and breath sounds normal.  Abdominal: Soft. Bowel sounds are normal. He exhibits no distension. There is no tenderness.  Musculoskeletal: Normal range of motion. He exhibits no edema and no tenderness.  Neurological: He is alert and oriented to person, place, and time.  Skin: Skin is warm and dry.  Right Femoral insertion site healed-no erythema warmth or induration  Psychiatric: He has a normal mood and affect.    ED Course  Procedures (including critical care time) Labs Review Labs Reviewed  CBC WITH DIFFERENTIAL - Abnormal; Notable for the following:    Hemoglobin 12.7 (*)    MCH 25.8 (*)    All other components within normal limits  COMPREHENSIVE METABOLIC PANEL - Abnormal; Notable for the following:    Sodium 134 (*)    Glucose, Bld 123 (*)    GFR calc non Af Amer 53 (*)    GFR calc Af Amer 61 (*)    All other  components within normal limits  URINALYSIS, ROUTINE W REFLEX MICROSCOPIC - Abnormal; Notable for the following:    APPearance CLOUDY (*)    Hgb urine dipstick SMALL (*)    Protein, ur 30 (*)    Leukocytes, UA SMALL (*)    All other components within normal limits  URINE MICROSCOPIC-ADD ON - Abnormal; Notable for the following:    Squamous Epithelial / LPF FEW (*)    All other components within normal limits  URINE CULTURE  CG4 I-STAT (LACTIC ACID)   Imaging Review Dg Chest 2 View  08/17/2013   CLINICAL DATA:  Shortness of breath, fever  EXAM: CHEST  2 VIEW  COMPARISON:  11/15/2012  FINDINGS: Cardiomediastinal silhouette is stable. Status post CABG again noted. Mild elevation of the right hemidiaphragm. No acute infiltrate or pulmonary edema. Bony thorax is stable.  IMPRESSION: No active cardiopulmonary disease.  Status post CABG again noted.   Electronically Signed   By: Natasha Mead   On: 08/17/2013 20:41    Date: 08/17/2013  Rate: 108  Rhythm: sinus tachycardia  QRS Axis: normal  Intervals: normal  ST/T Wave abnormalities: normal  Conduction Disutrbances:none  Narrative Interpretation: Sinus tach, inferior infarct, otherwise normal  Old EKG Reviewed: rate has increased, otherwise no significant change from EKG 08/12/13  MDM   72 year old male with extensive past medical history as detailed above, most notable for CAD status post multiple stents and CABG with stenting of his bypass graft last Thursday that in and out caths daily due to a ureteral stent that presents with fevers, congestion and chills. He is well appearing on exam. Lungs are clear. Abdomen is soft and nontender. No edema. HEENT exam within normal limits.  Differential diagnosis: Viral illness, pneumonia, UTI, postop infection  Given his well appearance is felt this is likely a viral illness.  Chest x-ray, urine, CBC, CMP to evaluate for other possible sources of infection.  11:30 PM UA c/w possible UTI.  The  remainder of his workup is reassuring including a normal lactate, normal white blood cell count and normal chest x-ray. His family physician has prescribed Cipro.  He was counseled to continue this medication.  Return precautions reviewed. It is felt the patient is stable for close PCP followup. All questions were answered. The patient and his wife were both in agreement with the plan.  Clinical Impression: 1. UTI (urinary tract infection)   2. Fever   3. Chills     Disposition: Discharge  Condition: Good  I have discussed the results, Dx and Tx plan. They expressed understanding and agree with the plan and were told to return to ED with any worsening  of condition or concern.    Discharge Medication List as of 08/17/2013 11:15 PM    START taking these medications   Details  ciprofloxacin (CIPRO) 500 MG tablet Take 1 tablet (500 mg total) by mouth 2 (two) times daily. One po bid x 7 days, Starting 08/17/2013, Until Discontinued, Print        Follow Up: [name], MD [address] [place] [address] [phone]  Schedule an appointment as soon as possible for a visit    Pt seen in conjunction with Dr. Hyacinth Meeker.  Reine Just. Beverely Pace, MD Emergency Medicine PGY-III [phone]   Oleh Genin, MD 08/18/13 Burna Mortimer

## 2013-08-17 NOTE — ED Provider Notes (Signed)
72 year old male who performs self-catheterization for urinary passage daily, states that he had recent minor injury to the urethra wall self cathing, has developed a fever today but no coughing, shortness of breath or chest pain. Had recent heart catheterization with stenting, denies any exertional symptoms, positional symptoms or peripheral edema. Denies rashes, diarrhea. On exam is clear heart and lung sounds, soft abdomen, no peripheral edema, clear oropharynx. Urinalysis reveals early urinary infection, culture ordered, antibiotics party been prescribed by his family doctor including ciprofloxacin, culture to be followed, patient aware. Patient stable for discharge.  Vida Roller, MD 08/17/13 2322

## 2013-08-17 NOTE — ED Notes (Addendum)
Pt states he has 3 stents placed on Thursday. Pt states that he mowed the lawn yesterday and today he has been having fever, chills, and SOB. Pt denies CP. Pt alert and oriented, breath sounds clear. Pt denies cough. Pt took 1000mg  of tylenol 35 min ago.

## 2013-08-18 NOTE — ED Provider Notes (Signed)
I saw and evaluated the patient, reviewed the resident's note and I agree with the findings and plan.  Please see my separate note regarding my evaluation of the patient.  Clinical Impression:  Fever, UTI   Vida Roller, MD 08/18/13 [phone]

## 2013-08-19 LAB — URINE CULTURE
Colony Count: NO GROWTH
Culture: NO GROWTH

## 2013-08-29 ENCOUNTER — Encounter: Payer: Medicare Other | Admitting: Physician Assistant

## 2013-08-31 ENCOUNTER — Encounter: Payer: Medicare Other | Admitting: Physician Assistant

## 2013-09-15 ENCOUNTER — Encounter: Payer: Self-pay | Admitting: Cardiology

## 2013-09-15 ENCOUNTER — Ambulatory Visit (INDEPENDENT_AMBULATORY_CARE_PROVIDER_SITE_OTHER): Payer: Medicare Other | Admitting: Cardiology

## 2013-09-15 VITALS — BP 126/76 | HR 63 | Ht 68.5 in | Wt 173.0 lb

## 2013-09-15 DIAGNOSIS — I2510 Atherosclerotic heart disease of native coronary artery without angina pectoris: Secondary | ICD-10-CM

## 2013-09-15 NOTE — Patient Instructions (Signed)
The current medical regimen is effective;  continue present plan and medications.  Follow up in 3 months with Dr Hochrein.  You will receive a letter in the mail 2 months before you are due.  Please call us when you receive this letter to schedule your follow up appointment.   

## 2013-09-15 NOTE — Progress Notes (Signed)
HPI The patient presents as a new patient for me. He was previously seeing Dr. Riley Kill.  He was recently sent for routine screening after our initial appointment and found to have a markedly abnormal exercise treadmill test. He subsequently was found to have coronary disease as described below and underwent PCI. He did well with this. He did have a urinary infection after this was seen in the emergency room. He's had problems with this in the past with self catheterization.  He was treated with ciprofloxacin. He says he's been doing well. In retrospect he did have his symptoms he thinks with some decreased exercise tolerance prior to PCI. However, he felt quite well since then. He denies any chest pressure, neck or arm discomfort. He's had no palpitations, presyncope or syncope. PND or orthopnea.  Allergies  Allergen Reactions  . Aleve [Naproxen Sodium] Other (See Comments)    Reaction: Heart attack symptoms  . Statins Other (See Comments)    SEVERE MUSCLE ACHES/ JOINT PAIN "arthritis symptoms"     Current Outpatient Prescriptions  Medication Sig Dispense Refill  . allopurinol (ZYLOPRIM) 100 MG tablet Take 100 mg by mouth daily at 12 noon.       Marland Kitchen aspirin EC 81 MG tablet Take 81 mg by mouth every morning.      . Azelastine HCl (ASTEPRO) 0.15 % SOLN Place 2 sprays into the nose daily as needed. For nasal congestion/allergy symptoms      . cetirizine (ZYRTEC) 10 MG tablet Take 10 mg by mouth daily as needed for allergies. For allergies      . ciprofloxacin (CIPRO) 500 MG tablet Take 1 tablet (500 mg total) by mouth 2 (two) times daily. One po bid x 7 days  14 tablet  0  . clopidogrel (PLAVIX) 75 MG tablet Take 1 tablet (75 mg total) by mouth daily with breakfast.  30 tablet  11  . clotrimazole-betamethasone (LOTRISONE) cream Apply 1 application topically daily as needed.       . cycloSPORINE (RESTASIS) 0.05 % ophthalmic emulsion Place 1 drop into both eyes 2 (two) times daily.      . diazepam  (VALIUM) 5 MG tablet Take 5 mg by mouth every 6 (six) hours as needed. For anxiety 1 hour before  procedures      . gabapentin (NEURONTIN) 100 MG capsule Take 100-200 mg by mouth 4 (four) times daily. 1 tab in the morning, 1 tab at lunch, 1 tab in the evening, and 2 tabs at bedtime      . hydrochlorothiazide (HYDRODIURIL) 25 MG tablet Take 12.5 mg by mouth 2 (two) times daily.       Marland Kitchen HYDROcodone-acetaminophen (NORCO) 10-325 MG per tablet Take 1 tablet by mouth every 8 (eight) hours as needed for pain. For pain      . lidocaine (XYLOCAINE) 2 % jelly Apply 1 application topically daily as needed. For catheter insertion      . lisinopril (PRINIVIL,ZESTRIL) 2.5 MG tablet Take 1 tablet (2.5 mg total) by mouth daily at 12 noon.  90 tablet  3  . magnesium oxide (MAG-OX) 400 MG tablet Take 400 mg by mouth daily at 12 noon.       . metoprolol (LOPRESSOR) 50 MG tablet Take 1 tablet (50 mg total) by mouth 2 (two) times daily.  180 tablet  3  . nitroGLYCERIN (NITROSTAT) 0.4 MG SL tablet Place 1 tablet (0.4 mg total) under the tongue every 5 (five) minutes x 3 doses as needed. For  chest pain  25 tablet  3  . OVER THE COUNTER MEDICATION Take 5 mLs by mouth daily. Med Name: ELDER BERRY LIQUID-1 TSP DAILY      . pantoprazole (PROTONIX) 40 MG tablet Take 1 tablet (40 mg total) by mouth daily.  30 tablet  11  . potassium chloride SA (K-DUR,KLOR-CON) 20 MEQ tablet Take 0.5 tablets (10 mEq total) by mouth 2 (two) times daily.  90 tablet  3  . ranitidine (ZANTAC) 150 MG tablet Take 150 mg by mouth at bedtime as needed for heartburn.       . VOLTAREN 1 % GEL Apply 2 g topically 4 (four) times daily as needed. For shoulder pain/inflammation      . zolpidem (AMBIEN) 10 MG tablet Take 10 mg by mouth at bedtime as needed. For sleep      . [DISCONTINUED] mometasone (NASONEX) 50 MCG/ACT nasal spray 2 sprays by Nasal route as needed.        No current facility-administered medications for this visit.    Past Medical History   Diagnosis Date  . HTN (hypertension)   . Stricture of urethra SELF-CATH AS NEEDED    S/P MULTIPLE DILATION'S   . Bladder neck contracture     POST TUI AND UROLUM STENTS  . Status post carotid endarterectomy LEFT  . Status post primary angioplasty with coronary stent 2006-- [place]    X2 DRUG-ELUTING STENTS  . CAD (coronary artery disease) CARDIOLOGIST- DR Riley Kill- VISIT NOTE 09-08-11 IN EPIC    LIMA LAD patent, SVT PDA mid 95% treated with DES, SVG RI patent, SVG diag patent EF 60%.  08/13/23  . GERD (gastroesophageal reflux disease)     CONTROLLED W/ PRILOSEC AND ZANTAC  . Impaired hearing BILATERAL AIDS  . Insomnia   . S/P coronary artery bypass graft x 3   . Urinary retention   . Esophageal disorder     narrowing  . Clotting disorder   . Peripheral neuropathy FEET  . Hiatal hernia   . Nephrolithiasis   . Kidney stones   . High cholesterol   . Heart murmur 1960  . Anginal pain 1989  . Myocardial infarction 2006    S/P PTCA W/ X2 DE STENTS  . DVT (deep venous thrombosis) 07/2012    riht IJ/notes 08/20/2012 (08/11/2013)  . DDD (degenerative disc disease)   . Arthritis     "all down my back; in my hands" (08/11/2013)  . Chronic back pain     "q vertebra in my back; L1 & L2 major" (08/10/2013)  . Gout 1971    "once" (08/11/2013)  . Prostate cancer     S/P RADICAL PROSTATECTOMY  . Self-catheterizes urinary bladder     "qd" (08/11/2013)    Past Surgical History  Procedure Laterality Date  . Cysto w/ balloon dilation urethral stricture  05-19-2011  . Transurethral vaporization bladder neck contracture / dilatation urethral stricture  12-26-2010  . Prostatectomy  1996  . Cystoscopy w/ internal urethrotomy  2003      X2--   W/ UROLUME PROSTHESIS  . Percutaneous nephrotomy  1989  . Extracorporeal shock wave lithotripsy  X5  . Urethral dilatation's  MULTIPLE   . Carotid endarterectomy Left 2002  . Cystoscopy with urethral dilatation  11/27/2011    Procedure: CYSTOSCOPY WITH URETHRAL  DILATATION;  Surgeon: Kathi Ludwig, MD;  Location: [facility];  Service: Urology;  Laterality: N/A;  ROOM # 2 REQUESTED   . Cystoscopy with urethral dilatation  02/26/2012    Procedure: CYSTOSCOPY WITH URETHRAL DILATATION;  Surgeon: Kathi Ludwig, MD;  Location[facility];  Service: Urology;  Laterality: N/A;  cysto, urethral dilation and possible holmium laser of stricture   . Transurethral resection of prostate  02/26/2012    Procedure: TRANSURETHRAL RESECTION OF THE PROSTATE (TURP);  Surgeon: Kathi Ludwig, MD;  Location: [facility];  Service: Urology;;  . Colonoscopy    . Polypectomy    . Tympanoplasty  1980'S    "patched eardrum" (08/11/2013)  . Cataract extraction w/ intraocular lens  implant, bilateral  02-16-13-02-24-13  . Coronary artery bypass graft  2000    "CABG X 5" (08/11/2013)  . Coronary angioplasty with stent placement  2004; 2006; 08/11/2013    "1 + 1 + 3" (08/11/2013)  . Cardiac catheterization  12-18-2010 AND 2008  . Coronary angioplasty  08/11/2013    ROS:  As stated in the HPI and negative for all other systems.  PHYSICAL EXAM BP 126/76  Pulse 63  Ht 5' 8.5" (1.74 m)  Wt 173 lb (78.472 kg)  BMI 25.92 kg/m2  SpO2 96% GENERAL:  Well appearing NECK:  No jugular venous distention, waveform within normal limits, carotid upstroke brisk and symmetric, no bruits, no thyromegaly LYMPHATICS:  No cervical, inguinal adenopathy LUNGS:  Clear to auscultation bilaterally BACK:  No CVA tenderness CHEST:  Well healed sternotomy scar. HEART:  PMI not displaced or sustained,S1 and S2 within normal limits, no S3, no S4, no clicks, no rubs, no murmurs ABD:  Flat, positive bowel sounds normal in frequency in pitch, no bruits, no rebound, no guarding, no midline pulsatile mass, no hepatomegaly, no splenomegaly EXT:  2 plus pulses throughout, no edema, no cyanosis no clubbing SKIN:  No rashes no nodules NEURO:  Cranial  nerves II through XII grossly intact, motor grossly intact throughout PSYCH:  Cognitively intact, oriented to person place and time   ASSESSMENT AND PLAN  CAD/CABG:  He is now doing well status post PCI. We will continue with risk reduction.  DYSLIPIDEMIA:  This is followed by [name], MD.  I will defer to her expertise.  HTN: The blood pressure is at target. No change in medications is indicated. We will continue with therapeutic lifestyle changes (TLC).  CAROTID STENOSIS:  He had 40-59% LAD stenosis and 1 to 39% right stenosis. We will check this again next September.

## 2013-09-28 ENCOUNTER — Telehealth: Payer: Self-pay | Admitting: Cardiology

## 2013-09-28 NOTE — Telephone Encounter (Signed)
Unsure if paperwork was received - nothing has been scanned into the system as of yet.  Requested they re-fax

## 2013-09-28 NOTE — Telephone Encounter (Signed)
New Problem:  Caleb Berry from [employer] is calling to confirm if dr. Antoine Poche received a recent fax from them. Caleb Berry states nurse can call back and speak to anyone. Please advise

## 2013-09-29 ENCOUNTER — Other Ambulatory Visit: Payer: Self-pay

## 2013-10-17 ENCOUNTER — Telehealth: Payer: Self-pay | Admitting: Cardiology

## 2013-10-17 DIAGNOSIS — T148XXA Other injury of unspecified body region, initial encounter: Secondary | ICD-10-CM

## 2013-10-17 NOTE — Telephone Encounter (Signed)
Pt states he is having a lot of bruising that sometimes occurs even when he is not aware of any injury.  Would like for Dr Antoine Poche to review to see if he can decrease the dosage of his Plavix.  Advised due to the type of stents he has in place I don't think a change can be made.  He is aware that I will call back after MD reviews.

## 2013-10-17 NOTE — Telephone Encounter (Signed)
New message     On plavix 75mg ---can he start taking 50mg  of plavix because he gets a lot of bruises

## 2013-10-18 NOTE — Telephone Encounter (Signed)
Pt aware - will come Monday for lab work as ordered.

## 2013-10-18 NOTE — Telephone Encounter (Signed)
We should check a CBC.  However, there is not a lower therapeutic level that I would suggest in this situation.

## 2013-10-24 ENCOUNTER — Other Ambulatory Visit (INDEPENDENT_AMBULATORY_CARE_PROVIDER_SITE_OTHER): Payer: Medicare Other

## 2013-10-24 DIAGNOSIS — T148XXA Other injury of unspecified body region, initial encounter: Secondary | ICD-10-CM

## 2013-10-24 LAB — CBC
MCHC: 31.9 g/dL (ref 30.0–36.0)
MCV: 77.8 fl — ABNORMAL LOW (ref 78.0–100.0)
RDW: 16.1 % — ABNORMAL HIGH (ref 11.5–14.6)
WBC: 7.9 10*3/uL (ref 4.5–10.5)

## 2013-11-11 ENCOUNTER — Encounter: Payer: Medicare Other | Attending: Family Medicine

## 2013-11-11 VITALS — Ht 68.5 in | Wt 174.0 lb

## 2013-11-11 DIAGNOSIS — E119 Type 2 diabetes mellitus without complications: Secondary | ICD-10-CM | POA: Insufficient documentation

## 2013-11-11 DIAGNOSIS — Z713 Dietary counseling and surveillance: Secondary | ICD-10-CM | POA: Diagnosis not present

## 2013-11-15 NOTE — Progress Notes (Signed)
Patient was seen on 11/11/13 for the first of a series of three diabetes self-management courses at the [facility].  Current HbA1c: 6.4%  The following learning objectives were met by the patient during this class:  Describe diabetes  State some common risk factors for diabetes  Defines the role of glucose and insulin  Identifies type of diabetes and pathophysiology  Describe the relationship between diabetes and cardiovascular risk  State the members of the Healthcare Team  States the rationale for glucose monitoring  State when to test glucose  State their individual Target Range  State the importance of logging glucose readings  Describe how to interpret glucose readings  Identifies A1C target  Explain the correlation between A1c and eAG values  State symptoms and treatment of high blood glucose  State symptoms and treatment of low blood glucose  Explain proper technique for glucose testing  Identifies proper sharps disposal  Handouts given during class include:  Living Well with Diabetes book  Carb Counting and Meal Planning book  Meal Plan Card  Carbohydrate guide  Meal planning worksheet  Low Sodium Flavoring Tips  The diabetes portion plate  Low Carbohydrate Snack Suggestions  A1c to eAG Conversion Chart  Diabetes Medications  Stress Management  Diabetes Recommended Care Schedule  Diabetes Success Plan  Core Class Satisfaction Survey  Your patient has identified their diabetes care support plan as:  [facility]  Staff  Follow-Up Plan:  Attend core 2

## 2013-12-15 ENCOUNTER — Encounter: Payer: Medicare Other | Attending: Family Medicine

## 2013-12-15 DIAGNOSIS — R7303 Prediabetes: Secondary | ICD-10-CM

## 2013-12-15 DIAGNOSIS — E119 Type 2 diabetes mellitus without complications: Secondary | ICD-10-CM | POA: Insufficient documentation

## 2013-12-15 DIAGNOSIS — Z713 Dietary counseling and surveillance: Secondary | ICD-10-CM | POA: Insufficient documentation

## 2013-12-15 NOTE — Progress Notes (Signed)
Patient was seen on 12/15/13 for the second of a series of three diabetes self-management courses at the [facility]. The following learning objectives were met by the patient during this class:   Describe the role of different macronutrients on glucose  Explain how carbohydrates affect blood glucose  State what foods contain the most carbohydrates  Demonstrate carbohydrate counting  Demonstrate how to read Nutrition Facts food label  Describe effects of various fats on heart health  Describe the importance of good nutrition for health and healthy eating strategies  Describe techniques for managing your shopping, cooking and meal planning  List strategies to follow meal plan when dining out  Describe the effects of alcohol on glucose and how to use it safely  Goals:  Follow Diabetes Meal Plan as instructed  Eat 3 meals and 2 snacks, every 3-5 hrs  Aim for carbohydrate intake to 45-60 grams carbohydrate/meal Aim for carbohydrate intake to 15-30 grams carbohydrate/snack Add lean protein foods to meals/snacks  Monitor glucose levels as instructed by your doctor   Follow-Up Plan:  Attend Core 3  Work towards following your personal food plan.

## 2013-12-22 ENCOUNTER — Ambulatory Visit: Payer: Medicare Other

## 2014-01-04 ENCOUNTER — Encounter: Payer: Medicare Other | Attending: Family Medicine

## 2014-01-04 DIAGNOSIS — Z713 Dietary counseling and surveillance: Secondary | ICD-10-CM | POA: Insufficient documentation

## 2014-01-04 DIAGNOSIS — E119 Type 2 diabetes mellitus without complications: Secondary | ICD-10-CM | POA: Insufficient documentation

## 2014-01-04 NOTE — Progress Notes (Signed)
Patient was seen on 01/04/14 for the third of a series of three diabetes self-management courses at the [facility]. The following learning objectives were met by the patient during this class:    State the amount of activity recommended for healthy living   Describe activities suitable for individual needs   Identify ways to regularly incorporate activity into daily life   Identify barriers to activity and ways to over come these barriers  Identify diabetes medications being personally used and their primary action for lowering glucose and possible side effects   Describe role of stress on blood glucose and develop strategies to address psychosocial issues   Identify diabetes complications and ways to prevent them  Explain how to manage diabetes during illness   Evaluate success in meeting personal goal   Establish 2-3 goals that they will plan to diligently work on until they return for the  51-month follow-up visit  Goals:  Follow Diabetes Meal Plan as instructed  Aim for 15-30 mins of physical activity daily as tolerated  Bring food record and glucose log to your follow up visit  Your patient has established the following 4 month goals in their individualized success plan: I will count my carb choices at most meals and snacks  Your patient has identified these potential barriers to change:  none  Your patient has identified their diabetes self-care support plan as  [facility]

## 2014-01-29 ENCOUNTER — Emergency Department (HOSPITAL_COMMUNITY)
Admission: EM | Admit: 2014-01-29 | Discharge: 2014-02-22 | Disposition: E | Payer: Medicare Other | Attending: Emergency Medicine | Admitting: Emergency Medicine

## 2014-01-29 ENCOUNTER — Encounter (HOSPITAL_COMMUNITY): Payer: Self-pay | Admitting: Emergency Medicine

## 2014-01-29 DIAGNOSIS — I209 Angina pectoris, unspecified: Secondary | ICD-10-CM | POA: Insufficient documentation

## 2014-01-29 DIAGNOSIS — M549 Dorsalgia, unspecified: Secondary | ICD-10-CM | POA: Insufficient documentation

## 2014-01-29 DIAGNOSIS — G609 Hereditary and idiopathic neuropathy, unspecified: Secondary | ICD-10-CM | POA: Insufficient documentation

## 2014-01-29 DIAGNOSIS — R143 Flatulence: Secondary | ICD-10-CM

## 2014-01-29 DIAGNOSIS — Z7902 Long term (current) use of antithrombotics/antiplatelets: Secondary | ICD-10-CM | POA: Insufficient documentation

## 2014-01-29 DIAGNOSIS — Z87891 Personal history of nicotine dependence: Secondary | ICD-10-CM | POA: Insufficient documentation

## 2014-01-29 DIAGNOSIS — G4700 Insomnia, unspecified: Secondary | ICD-10-CM | POA: Insufficient documentation

## 2014-01-29 DIAGNOSIS — H9190 Unspecified hearing loss, unspecified ear: Secondary | ICD-10-CM | POA: Insufficient documentation

## 2014-01-29 DIAGNOSIS — E7800 Pure hypercholesterolemia, unspecified: Secondary | ICD-10-CM | POA: Insufficient documentation

## 2014-01-29 DIAGNOSIS — Z87442 Personal history of urinary calculi: Secondary | ICD-10-CM | POA: Insufficient documentation

## 2014-01-29 DIAGNOSIS — Z9889 Other specified postprocedural states: Secondary | ICD-10-CM | POA: Insufficient documentation

## 2014-01-29 DIAGNOSIS — M109 Gout, unspecified: Secondary | ICD-10-CM | POA: Insufficient documentation

## 2014-01-29 DIAGNOSIS — Z9861 Coronary angioplasty status: Secondary | ICD-10-CM | POA: Insufficient documentation

## 2014-01-29 DIAGNOSIS — IMO0002 Reserved for concepts with insufficient information to code with codable children: Secondary | ICD-10-CM | POA: Insufficient documentation

## 2014-01-29 DIAGNOSIS — R011 Cardiac murmur, unspecified: Secondary | ICD-10-CM | POA: Insufficient documentation

## 2014-01-29 DIAGNOSIS — Z789 Other specified health status: Secondary | ICD-10-CM | POA: Insufficient documentation

## 2014-01-29 DIAGNOSIS — Z7982 Long term (current) use of aspirin: Secondary | ICD-10-CM | POA: Insufficient documentation

## 2014-01-29 DIAGNOSIS — R141 Gas pain: Secondary | ICD-10-CM | POA: Insufficient documentation

## 2014-01-29 DIAGNOSIS — I10 Essential (primary) hypertension: Secondary | ICD-10-CM | POA: Insufficient documentation

## 2014-01-29 DIAGNOSIS — I2510 Atherosclerotic heart disease of native coronary artery without angina pectoris: Secondary | ICD-10-CM | POA: Insufficient documentation

## 2014-01-29 DIAGNOSIS — I469 Cardiac arrest, cause unspecified: Secondary | ICD-10-CM

## 2014-01-29 DIAGNOSIS — K219 Gastro-esophageal reflux disease without esophagitis: Secondary | ICD-10-CM | POA: Insufficient documentation

## 2014-01-29 DIAGNOSIS — I252 Old myocardial infarction: Secondary | ICD-10-CM | POA: Insufficient documentation

## 2014-01-29 DIAGNOSIS — R404 Transient alteration of awareness: Secondary | ICD-10-CM | POA: Insufficient documentation

## 2014-01-29 DIAGNOSIS — Z86718 Personal history of other venous thrombosis and embolism: Secondary | ICD-10-CM | POA: Insufficient documentation

## 2014-01-29 DIAGNOSIS — G8929 Other chronic pain: Secondary | ICD-10-CM | POA: Insufficient documentation

## 2014-01-29 DIAGNOSIS — Z87448 Personal history of other diseases of urinary system: Secondary | ICD-10-CM | POA: Insufficient documentation

## 2014-01-29 DIAGNOSIS — Z951 Presence of aortocoronary bypass graft: Secondary | ICD-10-CM | POA: Insufficient documentation

## 2014-01-29 DIAGNOSIS — D689 Coagulation defect, unspecified: Secondary | ICD-10-CM | POA: Insufficient documentation

## 2014-01-29 DIAGNOSIS — Z79899 Other long term (current) drug therapy: Secondary | ICD-10-CM | POA: Insufficient documentation

## 2014-01-29 DIAGNOSIS — R142 Eructation: Secondary | ICD-10-CM | POA: Insufficient documentation

## 2014-01-29 DIAGNOSIS — Z8546 Personal history of malignant neoplasm of prostate: Secondary | ICD-10-CM | POA: Insufficient documentation

## 2014-01-29 DIAGNOSIS — Z792 Long term (current) use of antibiotics: Secondary | ICD-10-CM | POA: Insufficient documentation

## 2014-01-29 LAB — CBG MONITORING, ED: Glucose-Capillary: 295 mg/dL — ABNORMAL HIGH (ref 70–99)

## 2014-01-29 MED ORDER — EPINEPHRINE HCL 0.1 MG/ML IJ SOSY
PREFILLED_SYRINGE | INTRAMUSCULAR | Status: AC | PRN
  Administered 2014-01-29 (×4): 1 mg via INTRAVENOUS

## 2014-01-29 MED ORDER — MORPHINE SULFATE 4 MG/ML IJ SOLN: 8.0000 mg | Freq: Once | INTRAMUSCULAR | Status: DC

## 2014-01-29 MED ORDER — SODIUM BICARBONATE 8.4 % IV SOLN
INTRAVENOUS | Status: AC | PRN
  Administered 2014-01-29: 50 meq via INTRAVENOUS

## 2014-01-29 MED ORDER — CALCIUM CHLORIDE 10 % IV SOLN
INTRAVENOUS | Status: AC | PRN
  Administered 2014-01-29: 1 g via INTRAVENOUS

## 2014-01-29 MED ORDER — MORPHINE SULFATE 2 MG/ML IJ SOLN
INTRAMUSCULAR | Status: AC
  Filled 2014-01-29: qty 4

## 2014-01-29 MED ORDER — MORPHINE SULFATE 4 MG/ML IJ SOLN: 4.0000 mg | Freq: Once | INTRAMUSCULAR | Status: DC

## 2014-01-29 NOTE — ED Notes (Signed)
Pulse check, pt remains pulses CPR continued.

## 2014-01-29 NOTE — ED Provider Notes (Signed)
CSN: [id number]     Arrival date & time 02/01/2014  2139 History   First MD Initiated Contact with Patient 02/07/2014 2214     Chief Complaint  Patient presents with  . Cardiac Arrest    HPI  73 yo M who presented via EMS in active CPR. Patient reportedly developed CP earlier today took an ASA and subsequently collapsed and went unresponsive. CPR started immediately by family. When EMS arrived patient was in VFIB and received ~30 minutes of ACLS including multiple rounds of epi and defibrillations. On arrival to ED patient has not had ROSC. Initial rhythm was PEA. No further information is available at this time.   Past Medical History  Diagnosis Date  . HTN (hypertension)   . Stricture of urethra SELF-CATH AS NEEDED    S/P MULTIPLE DILATION'S   . Bladder neck contracture     POST TUI AND UROLUM STENTS  . Status post carotid endarterectomy LEFT  . Status post primary angioplasty with coronary stent 2006-- [place]    X2 DRUG-ELUTING STENTS  . CAD (coronary artery disease) CARDIOLOGIST- DR Lia Foyer- VISIT NOTE 09-08-11 IN EPIC    LIMA LAD patent, SVT PDA mid 95% treated with DES, SVG RI patent, SVG diag patent EF 60%.  08/13/23  . GERD (gastroesophageal reflux disease)     CONTROLLED W/ PRILOSEC AND ZANTAC  . Impaired hearing BILATERAL AIDS  . Insomnia   . S/P coronary artery bypass graft x 3   . Urinary retention   . Esophageal disorder     narrowing  . Clotting disorder   . Peripheral neuropathy FEET  . Hiatal hernia   . Nephrolithiasis   . Kidney stones   . High cholesterol   . Heart murmur 1960  . Anginal pain 1989  . Myocardial infarction 2006    S/P PTCA W/ X2 DE STENTS  . DVT (deep venous thrombosis) 07/2012    riht IJ/notes 08/20/2012 (08/11/2013)  . DDD (degenerative disc disease)   . Arthritis     "all down my back; in my hands" (08/11/2013)  . Chronic back pain     "q vertebra in my back; L1 & L2 major" (08/10/2013)  . Gout 1971    "once" (08/11/2013)  . Prostate cancer      S/P RADICAL PROSTATECTOMY  . Self-catheterizes urinary bladder     "qd" (08/11/2013)   Past Surgical History  Procedure Laterality Date  . Cysto w/ balloon dilation urethral stricture  05-19-2011  . Transurethral vaporization bladder neck contracture / dilatation urethral stricture  12-26-2010  . Prostatectomy  1996  . Cystoscopy w/ internal urethrotomy  2003      X2--   W/ UROLUME PROSTHESIS  . Percutaneous nephrotomy  1989  . Extracorporeal shock wave lithotripsy  X5  . Urethral dilatation's  MULTIPLE   . Carotid endarterectomy Left 2002  . Cystoscopy with urethral dilatation  11/27/2011    Procedure: CYSTOSCOPY WITH URETHRAL DILATATION;  Surgeon: Ailene Rud, MD;  Location: [facility];  Service: Urology;  Laterality: N/A;  ROOM # 2 REQUESTED   . Cystoscopy with urethral dilatation  02/26/2012    Procedure: CYSTOSCOPY WITH URETHRAL DILATATION;  Surgeon: Ailene Rud, MD;  Location: [facility];  Service: Urology;  Laterality: N/A;  cysto, urethral dilation and possible holmium laser of stricture   . Transurethral resection of prostate  02/26/2012    Procedure: TRANSURETHRAL RESECTION OF THE PROSTATE (TURP);  Surgeon: Ailene Rud, MD;  Location:  [place];  Service: Urology;;  . Colonoscopy    . Polypectomy    . Tympanoplasty  1980'S    "patched eardrum" (08/11/2013)  . Cataract extraction w/ intraocular lens  implant, bilateral  02-16-13-02-24-13  . Coronary artery bypass graft  2000    "CABG X 5" (08/11/2013)  . Coronary angioplasty with stent placement  2004; 2006; 08/11/2013    "1 + 1 + 3" (08/11/2013)  . Cardiac catheterization  12-18-2010 AND 2008  . Coronary angioplasty  08/11/2013   Family History  Problem Relation Age of Onset  . Heart disease Mother   . Heart attack Father   . Colon cancer Neg Hx   . Rectal cancer Neg Hx   . Stomach cancer Neg Hx    History  Substance Use Topics  . Smoking status:  Former Smoker -- 0.75 packs/day for 15 years    Types: Cigarettes    Quit date: 11/24/1968  . Smokeless tobacco: Never Used  . Alcohol Use: No    Review of Systems  Unable to perform ROS: Patient unresponsive      Allergies  Aleve; Codeine sulfate; and Statins  Home Medications   Current Outpatient Rx  Name  Route  Sig  Dispense  Refill  . allopurinol (ZYLOPRIM) 100 MG tablet   Oral   Take 100 mg by mouth daily at 12 noon.          Marland Kitchen aspirin EC 81 MG tablet   Oral   Take 81 mg by mouth every morning.         . Azelastine HCl (ASTEPRO) 0.15 % SOLN   Nasal   Place 2 sprays into the nose daily as needed. For nasal congestion/allergy symptoms         . cetirizine (ZYRTEC) 10 MG tablet   Oral   Take 10 mg by mouth daily as needed for allergies. For allergies         . ciprofloxacin (CIPRO) 500 MG tablet   Oral   Take 1 tablet (500 mg total) by mouth 2 (two) times daily. One po bid x 7 days   14 tablet   0   . clopidogrel (PLAVIX) 75 MG tablet   Oral   Take 1 tablet (75 mg total) by mouth daily with breakfast.   30 tablet   11   . clotrimazole-betamethasone (LOTRISONE) cream   Topical   Apply 1 application topically daily as needed.          . cycloSPORINE (RESTASIS) 0.05 % ophthalmic emulsion   Both Eyes   Place 1 drop into both eyes 2 (two) times daily.         . diazepam (VALIUM) 5 MG tablet   Oral   Take 5 mg by mouth every 6 (six) hours as needed. For anxiety 1 hour before  procedures         . gabapentin (NEURONTIN) 100 MG capsule   Oral   Take 100-200 mg by mouth 4 (four) times daily. 1 tab in the morning, 1 tab at lunch, 1 tab in the evening, and 2 tabs at bedtime         . hydrochlorothiazide (HYDRODIURIL) 25 MG tablet   Oral   Take 12.5 mg by mouth 2 (two) times daily.          Marland Kitchen HYDROcodone-acetaminophen (NORCO) 10-325 MG per tablet   Oral   Take 1 tablet by mouth every 8 (eight) hours as needed for pain.  For pain          . lidocaine (XYLOCAINE) 2 % jelly   Topical   Apply 1 application topically daily as needed. For catheter insertion         . lisinopril (PRINIVIL,ZESTRIL) 2.5 MG tablet   Oral   Take 1 tablet (2.5 mg total) by mouth daily at 12 noon.   90 tablet   3   . magnesium oxide (MAG-OX) 400 MG tablet   Oral   Take 400 mg by mouth daily at 12 noon.          . metoprolol (LOPRESSOR) 50 MG tablet   Oral   Take 1 tablet (50 mg total) by mouth 2 (two) times daily.   180 tablet   3   . nitroGLYCERIN (NITROSTAT) 0.4 MG SL tablet   Sublingual   Place 1 tablet (0.4 mg total) under the tongue every 5 (five) minutes x 3 doses as needed. For chest pain   25 tablet   3   . OVER THE COUNTER MEDICATION   Oral   Take 5 mLs by mouth daily. Med Name: ELDER BERRY LIQUID-1 TSP DAILY         . pantoprazole (PROTONIX) 40 MG tablet   Oral   Take 1 tablet (40 mg total) by mouth daily.   30 tablet   11   . potassium chloride SA (K-DUR,KLOR-CON) 20 MEQ tablet   Oral   Take 0.5 tablets (10 mEq total) by mouth 2 (two) times daily.   90 tablet   3   . ranitidine (ZANTAC) 150 MG tablet   Oral   Take 150 mg by mouth at bedtime as needed for heartburn.          . VOLTAREN 1 % GEL   Topical   Apply 2 g topically 4 (four) times daily as needed. For shoulder pain/inflammation         . zolpidem (AMBIEN) 10 MG tablet   Oral   Take 10 mg by mouth at bedtime as needed. For sleep          Wt 175 lb (79.379 kg) Physical Exam  Constitutional: He appears distressed.  HENT:  Head: Atraumatic.  Eyes: Right eye exhibits no discharge. Left eye exhibits no discharge.  Neck: Neck supple. No tracheal deviation present.  Cardiovascular:  Pulseless   Pulmonary/Chest:  Initially no breath sounds on L- ETT pulled back 3 cm with subsequent good BLT breath sounds.   Abdominal: He exhibits distension.  Neurological:  4 mm non reactive No response to painful stimuli No gag      ED Course   Procedures (including critical care time) Labs Review Labs Reviewed  CBG MONITORING, ED - Abnormal; Notable for the following:    Glucose-Capillary 295 (*)    All other components within normal limits   BEDSIDE US with no meaningful cardiac activity.  MDM   Final diagnoses:  Cardiac arrest    73 yo M who presented via EMS in active CPR. Patient reportedly developed CP earlier today took an ASA and subsequently collapsed and went unresponsive. CPR started immediately by family. When EMS arrived patient was in VFIB and received ~30 minutes of ACLS including multiple rounds of epi and defibrillations. On arrival to ED patient has not had ROSC. Initial rhythm was PEA. No further information is available at this time.    ACLS was continued. Family was brought to room during resuscitation. They expressed that patient would not have  wanted any heroic measures. Morphine ordered and patient was extubated. Family remained at bedside. Patient was pronounced at 22:11.  Case co managed with my attending Dr. Leonides Schanz. No cardiac activity on repeat US.     Ruthell Rummage, MD 02/12/14 [phone]

## 2014-01-29 NOTE — Code Documentation (Addendum)
Per EMS: pt was complaining of CP and took 1 81mg  of aspirin at home. Pt wife then states that he went unconscious in front of his wife who started CPR. Upon EMS arrival pt was found apnic and pulseless. Pt was given 7 epis and went in and out of V-fib rhythm and was shocked 6 times. Pt was also given D50 450 of amiodarone. Pt was intubated with a 8.0 tube and capnography was 35. Pt was 1500ML of NS with EMS. Pt CPR prior to arrival was approx 40 min.

## 2014-01-29 NOTE — Progress Notes (Signed)
Chaplain paged to CPR. Chaplain provided ministry of presence. 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 Later he mentioned pain." Chaplain offered pastoral care and support to patient's family.   30-Jan-2014 2147  Clinical Encounter Type  Visited With Family  Visit Type Initial;Death;ED  Referral From Nurse  Spiritual Encounters  Spiritual Needs Emotional

## 2014-01-29 NOTE — ED Notes (Signed)
Resident ausculted for breath sounds, pt has breath sounds on right but not on left, ETT pulled back, breath sounds equal after tube readjusted.

## 2014-01-29 NOTE — ED Notes (Signed)
Pulse check, pt remains pulses, no cardiac activity on ultrasound. CPR continued.

## 2014-01-29 NOTE — ED Notes (Signed)
Family present during [facility]. Pt family does not want any other extensive measures taken.

## 2014-01-29 NOTE — Code Documentation (Addendum)
Per EMS: pt complaining of CP with wife and took 81mg  of aspirin at home. Pt then went unresponsive with wife. Pt's wife started compressions/CPR. Upon EMS arrival pt was unresponsive pulseless and apnic in V-fib. Pt was shocked 6 times with EMS and given 7 Epi. Pt was

## 2014-01-29 NOTE — ED Notes (Signed)
Family at bedside. 

## 2014-01-29 NOTE — ED Notes (Signed)
Pt arrived with CPR in progress.

## 2014-01-30 MED FILL — Medication: Qty: 1 | Status: AC

## 2014-02-22 NOTE — ED Notes (Signed)
Pt no longer guppie breathing, pulses not palpable, neither can be heard with doppler and no cardiac activity on ultrasound. Morphine held. Pt TOD 22:11

## 2014-02-22 NOTE — ED Notes (Signed)
Pt is guppie breathing, family at bedside aware that patient has a faint pulse but family does not want any other extensive measures taken. EDP ordered morphine for patient comfort.

## 2014-02-22 NOTE — ED Provider Notes (Signed)
I saw and evaluated the patient, reviewed the resident's note and I agree with the findings and plan.   EKG Interpretation None      Pt is a 73 y.o. M with h/o CAD STATUS post CABG and stents who presents emergency department in cardiac arrest. Per EMS, she began complaining of chest pain, took aspirin and then collapsed. His wife started compressions immediately. Upon EMS arrival, patient was in V. fib. They have perform compressions for approximately 35 minutes. They had given 7 rounds of epinephrine, 1 amp of D50, 450 mg of amiodarone and have defibrillator patient 6 times. He has an 8.0 ET tube placed and was given approximately 1500 mL of IV cold saline. He has an IO in his right tibia. We perform compressions for another 35 minutes. Patient and equal breath sounds bilaterally and his oxygen saturation was good compressions was greater than 95%. His capnography was 35. Patient had PEA. He is given multiple rounds of epinephrine, calcium, bicarbonate and IV fluids. A second IO was placed in the left tibia as the first IO infiltrated. We did have return of spontaneous circulation at approximately 03-26-2204 but bedside ultrasound confirmed ejection fraction of likely less than 10%. Family was brought to bedside and patient's wife and son made the decision to stop any further resuscitative measures and keep the patient comfortable. Patient had minimal amount of agonal respirations and became bradycardic and then asystolic. Time of death was called at 03-26-2210. Discussed with Dr. Dara Lords, medical examiner, he reports this is not a medical examiner case. Discussed with Dr. Freddrick March who is on call for Dr. Harlan Stains, patient's PCP, who will complete the death certificate.   Cardiopulmonary Resuscitation (CPR) Procedure Note Directed/Performed by: Nyra Jabs I personally directed ancillary staff and/or performed CPR in an effort to regain return of spontaneous circulation and to maintain cardiac, neuro and  systemic perfusion.    CRITICAL CARE Performed by: Nyra Jabs   Total critical care time: 35 minutes  Critical care time was exclusive of separately billable procedures and treating other patients.  Critical care was necessary to treat or prevent imminent or life-threatening deterioration.  Critical care was time spent personally by me on the following activities: development of treatment plan with patient and/or surrogate as well as nursing, discussions with consultants, evaluation of patient's response to treatment, examination of patient, obtaining history from patient or surrogate, ordering and performing treatments and interventions, ordering and review of laboratory studies, ordering and review of radiographic studies, pulse oximetry and re-evaluation of patient's condition.   [place], DO 02/07/2014 03-26-05

## 2014-02-22 DEATH — deceased

## 2014-04-24 ENCOUNTER — Ambulatory Visit: Payer: Medicare Other

## 2014-11-02 ENCOUNTER — Encounter (HOSPITAL_COMMUNITY): Payer: Self-pay | Admitting: Cardiology

## 2014-11-15 IMAGING — CR DG CHEST 2V
2 series · 2 of 2 positions shown · non-contrast
Comparison: 11/15/2012

CLINICAL DATA: Shortness of breath, fever

EXAM:
CHEST  2 VIEW

[w chest pa]
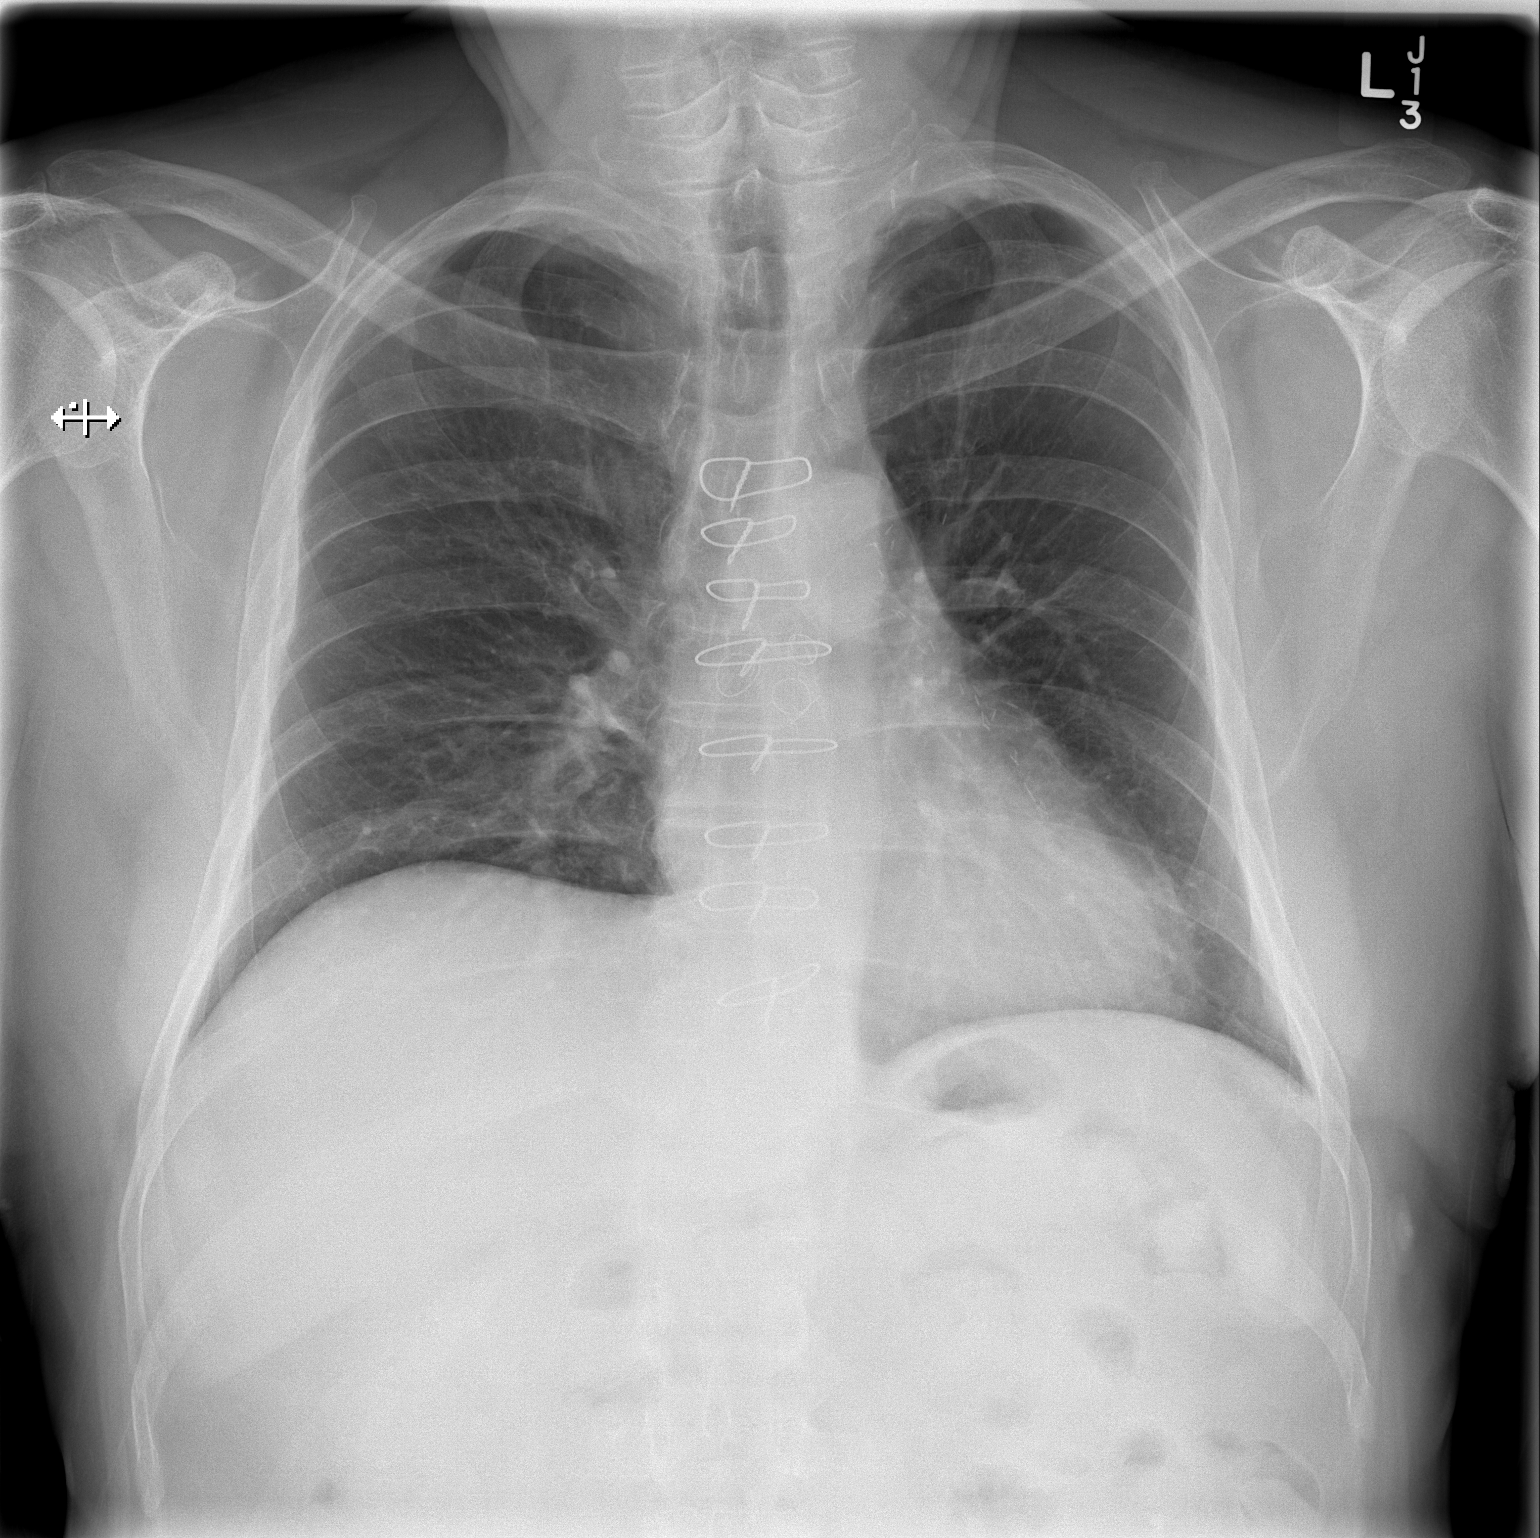

[w chest lat]
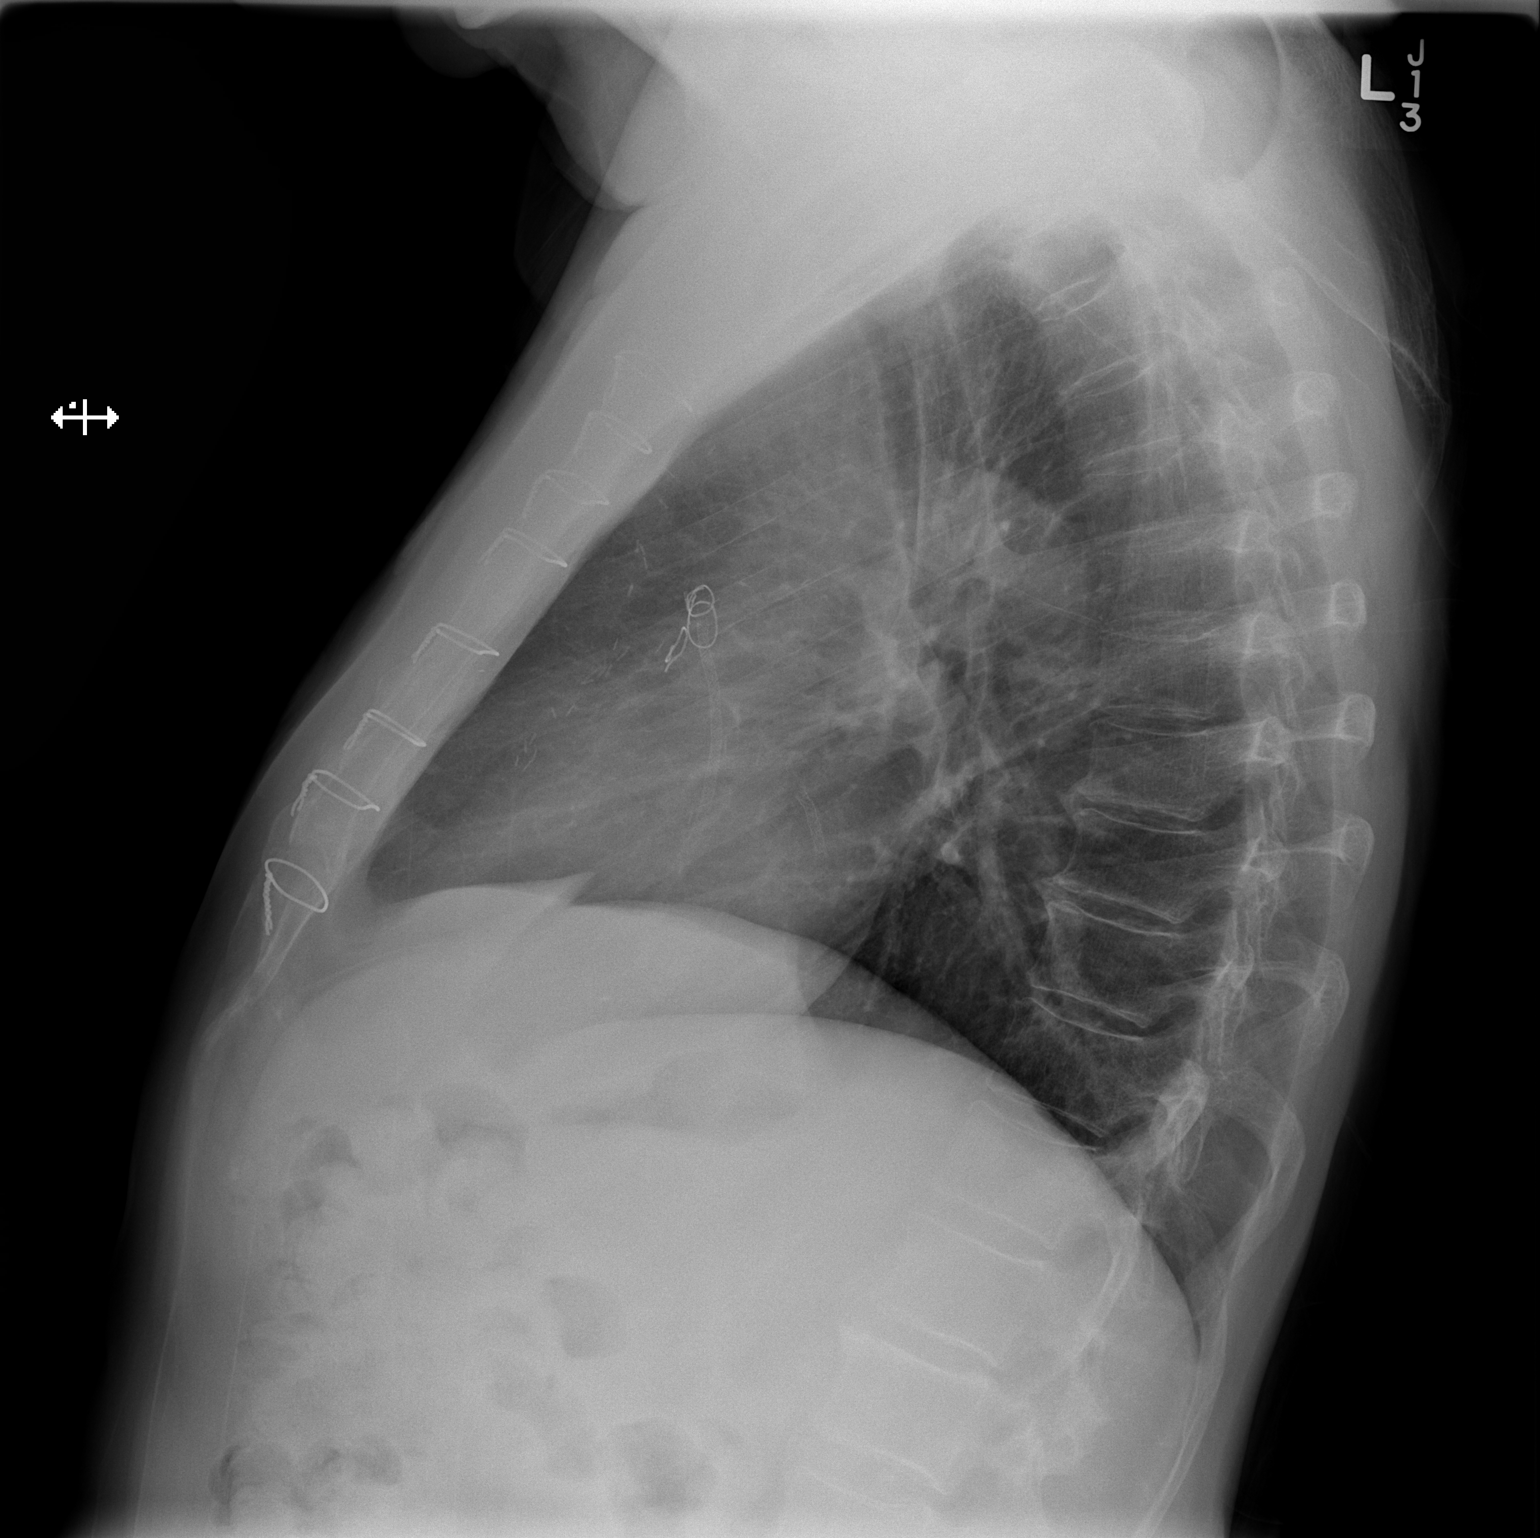

[2 of 2 positions shown; findings below may reference images not displayed]

FINDINGS: Cardiomediastinal silhouette is stable. Status post CABG again
noted. Mild elevation of the right hemidiaphragm. No acute
infiltrate or pulmonary edema. Bony thorax is stable.
IMPRESSION: No active cardiopulmonary disease.  Status post CABG again noted.
